# Patient Record
Sex: Female | Born: 1960 | Race: Black or African American | Hispanic: No | Smoking: Former smoker
Health system: Southern US, Community
[De-identification: ages and names within clinical notes are randomized; demographics above are authoritative.]

## PROBLEM LIST (undated history)

## (undated) DIAGNOSIS — I509 Heart failure, unspecified: Secondary | ICD-10-CM

## (undated) DIAGNOSIS — M1990 Unspecified osteoarthritis, unspecified site: Secondary | ICD-10-CM

## (undated) DIAGNOSIS — G4730 Sleep apnea, unspecified: Secondary | ICD-10-CM

## (undated) DIAGNOSIS — I10 Essential (primary) hypertension: Secondary | ICD-10-CM

## (undated) DIAGNOSIS — J45909 Unspecified asthma, uncomplicated: Secondary | ICD-10-CM

## (undated) DIAGNOSIS — E119 Type 2 diabetes mellitus without complications: Secondary | ICD-10-CM

## (undated) DIAGNOSIS — R569 Unspecified convulsions: Secondary | ICD-10-CM

## (undated) DIAGNOSIS — R0600 Dyspnea, unspecified: Secondary | ICD-10-CM

## (undated) DIAGNOSIS — I209 Angina pectoris, unspecified: Secondary | ICD-10-CM

## (undated) HISTORY — PX: CARPAL TUNNEL RELEASE: SHX101

## (undated) HISTORY — DX: Heart failure, unspecified: I50.9

## (undated) HISTORY — PX: TONSILLECTOMY: SUR1361

---

## 1998-01-14 ENCOUNTER — Other Ambulatory Visit: Admission: RE | Admit: 1998-01-14 | Discharge: 1998-01-14 | Payer: Self-pay | Admitting: Internal Medicine

## 1998-03-19 ENCOUNTER — Emergency Department (HOSPITAL_COMMUNITY): Admission: EM | Admit: 1998-03-19 | Discharge: 1998-03-19 | Payer: Self-pay | Admitting: Emergency Medicine

## 1998-03-21 ENCOUNTER — Other Ambulatory Visit: Admission: RE | Admit: 1998-03-21 | Discharge: 1998-03-21 | Payer: Self-pay | Admitting: Family Medicine

## 1998-04-03 ENCOUNTER — Emergency Department (HOSPITAL_COMMUNITY): Admission: EM | Admit: 1998-04-03 | Discharge: 1998-04-03 | Payer: Self-pay | Admitting: Emergency Medicine

## 1998-04-04 ENCOUNTER — Encounter: Admission: RE | Admit: 1998-04-04 | Discharge: 1998-04-04 | Payer: Self-pay | Admitting: Obstetrics

## 1998-08-13 ENCOUNTER — Emergency Department (HOSPITAL_COMMUNITY): Admission: EM | Admit: 1998-08-13 | Discharge: 1998-08-13 | Payer: Self-pay | Admitting: Emergency Medicine

## 1998-08-13 ENCOUNTER — Encounter: Payer: Self-pay | Admitting: Emergency Medicine

## 1998-09-16 ENCOUNTER — Ambulatory Visit (HOSPITAL_COMMUNITY): Admission: RE | Admit: 1998-09-16 | Discharge: 1998-09-16 | Payer: Self-pay | Admitting: Family Medicine

## 1998-09-16 ENCOUNTER — Encounter: Payer: Self-pay | Admitting: Family Medicine

## 1998-10-30 ENCOUNTER — Encounter: Payer: Self-pay | Admitting: Emergency Medicine

## 1998-10-30 ENCOUNTER — Emergency Department (HOSPITAL_COMMUNITY): Admission: EM | Admit: 1998-10-30 | Discharge: 1998-10-30 | Payer: Self-pay | Admitting: Emergency Medicine

## 1998-12-03 ENCOUNTER — Other Ambulatory Visit: Admission: RE | Admit: 1998-12-03 | Discharge: 1998-12-03 | Payer: Self-pay

## 1999-02-04 ENCOUNTER — Encounter: Payer: Self-pay | Admitting: Emergency Medicine

## 1999-02-04 ENCOUNTER — Emergency Department (HOSPITAL_COMMUNITY): Admission: EM | Admit: 1999-02-04 | Discharge: 1999-02-04 | Payer: Self-pay | Admitting: Emergency Medicine

## 1999-02-22 ENCOUNTER — Encounter: Payer: Self-pay | Admitting: Emergency Medicine

## 1999-02-22 ENCOUNTER — Emergency Department (HOSPITAL_COMMUNITY): Admission: EM | Admit: 1999-02-22 | Discharge: 1999-02-22 | Payer: Self-pay | Admitting: Emergency Medicine

## 1999-02-23 ENCOUNTER — Emergency Department (HOSPITAL_COMMUNITY): Admission: EM | Admit: 1999-02-23 | Discharge: 1999-02-23 | Payer: Self-pay | Admitting: Emergency Medicine

## 1999-03-04 ENCOUNTER — Encounter: Admission: RE | Admit: 1999-03-04 | Discharge: 1999-03-04 | Payer: Self-pay | Admitting: Obstetrics & Gynecology

## 1999-03-06 ENCOUNTER — Encounter: Payer: Self-pay | Admitting: Emergency Medicine

## 1999-03-06 ENCOUNTER — Emergency Department (HOSPITAL_COMMUNITY): Admission: EM | Admit: 1999-03-06 | Discharge: 1999-03-06 | Payer: Self-pay | Admitting: Emergency Medicine

## 1999-11-16 ENCOUNTER — Emergency Department (HOSPITAL_COMMUNITY): Admission: EM | Admit: 1999-11-16 | Discharge: 1999-11-16 | Payer: Self-pay | Admitting: Emergency Medicine

## 1999-11-16 ENCOUNTER — Encounter: Payer: Self-pay | Admitting: Emergency Medicine

## 2000-01-01 ENCOUNTER — Emergency Department (HOSPITAL_COMMUNITY): Admission: EM | Admit: 2000-01-01 | Discharge: 2000-01-01 | Payer: Self-pay | Admitting: Emergency Medicine

## 2002-08-29 ENCOUNTER — Emergency Department (HOSPITAL_COMMUNITY): Admission: EM | Admit: 2002-08-29 | Discharge: 2002-08-29 | Payer: Self-pay | Admitting: Emergency Medicine

## 2006-04-30 ENCOUNTER — Other Ambulatory Visit: Payer: Self-pay

## 2006-04-30 ENCOUNTER — Emergency Department: Payer: Self-pay | Admitting: Emergency Medicine

## 2006-06-12 ENCOUNTER — Emergency Department: Payer: Self-pay | Admitting: Emergency Medicine

## 2007-08-02 ENCOUNTER — Other Ambulatory Visit: Payer: Self-pay

## 2007-08-02 ENCOUNTER — Ambulatory Visit: Payer: Self-pay | Admitting: Orthopedic Surgery

## 2007-08-09 ENCOUNTER — Ambulatory Visit: Payer: Self-pay | Admitting: Orthopedic Surgery

## 2008-04-28 ENCOUNTER — Emergency Department: Payer: Self-pay | Admitting: Emergency Medicine

## 2008-12-15 ENCOUNTER — Emergency Department: Payer: Self-pay | Admitting: Emergency Medicine

## 2009-06-29 ENCOUNTER — Inpatient Hospital Stay: Payer: Self-pay | Admitting: Internal Medicine

## 2009-08-05 ENCOUNTER — Ambulatory Visit: Payer: Self-pay | Admitting: Unknown Physician Specialty

## 2009-08-08 ENCOUNTER — Ambulatory Visit: Payer: Self-pay | Admitting: Family Medicine

## 2009-08-22 ENCOUNTER — Ambulatory Visit: Payer: Self-pay | Admitting: Gastroenterology

## 2009-09-04 ENCOUNTER — Ambulatory Visit: Payer: Self-pay | Admitting: Family Medicine

## 2009-10-17 ENCOUNTER — Ambulatory Visit: Payer: Self-pay | Admitting: Family Medicine

## 2009-10-24 ENCOUNTER — Inpatient Hospital Stay: Payer: Self-pay | Admitting: Internal Medicine

## 2009-10-25 ENCOUNTER — Ambulatory Visit: Payer: Self-pay | Admitting: Cardiovascular Disease

## 2009-11-05 ENCOUNTER — Ambulatory Visit: Payer: Self-pay | Admitting: Family Medicine

## 2009-12-03 ENCOUNTER — Ambulatory Visit: Payer: Self-pay | Admitting: Family Medicine

## 2010-01-03 ENCOUNTER — Ambulatory Visit: Payer: Self-pay | Admitting: Family Medicine

## 2010-03-11 ENCOUNTER — Ambulatory Visit: Payer: Self-pay | Admitting: Gastroenterology

## 2010-09-09 ENCOUNTER — Ambulatory Visit: Payer: Self-pay | Admitting: Family Medicine

## 2010-10-09 ENCOUNTER — Ambulatory Visit: Payer: Self-pay | Admitting: Gastroenterology

## 2010-10-26 ENCOUNTER — Emergency Department (HOSPITAL_COMMUNITY)
Admission: EM | Admit: 2010-10-26 | Discharge: 2010-10-26 | Payer: Self-pay | Source: Home / Self Care | Admitting: Emergency Medicine

## 2010-10-28 LAB — URINALYSIS, ROUTINE W REFLEX MICROSCOPIC
Leukocytes, UA: NEGATIVE
Nitrite: NEGATIVE
Specific Gravity, Urine: 1.02 (ref 1.005–1.030)
Urobilinogen, UA: 0.2 mg/dL (ref 0.0–1.0)

## 2010-10-28 LAB — URINE MICROSCOPIC-ADD ON

## 2010-10-28 LAB — BASIC METABOLIC PANEL
BUN: 10 mg/dL (ref 6–23)
CO2: 28 mEq/L (ref 19–32)
Calcium: 9.2 mg/dL (ref 8.4–10.5)
Creatinine, Ser: 0.94 mg/dL (ref 0.4–1.2)
Glucose, Bld: 96 mg/dL (ref 70–99)

## 2010-10-28 LAB — CBC
HCT: 40.3 % (ref 36.0–46.0)
Hemoglobin: 13 g/dL (ref 12.0–15.0)
MCV: 103.9 fL — ABNORMAL HIGH (ref 78.0–100.0)
WBC: 7.4 10*3/uL (ref 4.0–10.5)

## 2010-10-28 LAB — GLUCOSE, CAPILLARY

## 2010-10-28 LAB — POCT CARDIAC MARKERS
CKMB, poc: 1.5 ng/mL (ref 1.0–8.0)
CKMB, poc: 1.1 ng/mL (ref 1.0–8.0)
Myoglobin, poc: 50.1 ng/mL (ref 12–200)
Troponin i, poc: <0.05 ng/mL (ref 0.00–0.09)

## 2010-10-28 LAB — DIFFERENTIAL
Basophils Absolute: 0 10*3/uL (ref 0.0–0.1)
Lymphocytes Relative: 50 % — ABNORMAL HIGH (ref 12–46)
Lymphs Abs: 3.7 10*3/uL (ref 0.7–4.0)
Monocytes Absolute: 0.5 10*3/uL (ref 0.1–1.0)
Neutro Abs: 3.1 10*3/uL (ref 1.7–7.7)

## 2010-11-10 ENCOUNTER — Ambulatory Visit: Payer: Self-pay | Admitting: Neurology

## 2010-11-21 ENCOUNTER — Emergency Department: Payer: Self-pay | Admitting: Emergency Medicine

## 2011-09-04 ENCOUNTER — Ambulatory Visit: Payer: Self-pay | Admitting: Ophthalmology

## 2011-09-04 DIAGNOSIS — I119 Hypertensive heart disease without heart failure: Secondary | ICD-10-CM

## 2011-09-07 ENCOUNTER — Ambulatory Visit: Payer: Self-pay | Admitting: Ophthalmology

## 2012-05-31 ENCOUNTER — Ambulatory Visit: Payer: Self-pay | Admitting: Family Medicine

## 2012-06-16 ENCOUNTER — Emergency Department: Payer: Self-pay | Admitting: Emergency Medicine

## 2012-06-23 ENCOUNTER — Ambulatory Visit: Payer: Self-pay | Admitting: Family Medicine

## 2012-12-01 ENCOUNTER — Ambulatory Visit: Payer: Self-pay | Admitting: Pain Medicine

## 2012-12-14 ENCOUNTER — Ambulatory Visit: Payer: Self-pay | Admitting: Pain Medicine

## 2013-01-12 ENCOUNTER — Ambulatory Visit: Payer: Self-pay | Admitting: Pain Medicine

## 2013-01-30 ENCOUNTER — Ambulatory Visit: Payer: Self-pay | Admitting: Pain Medicine

## 2013-04-18 ENCOUNTER — Ambulatory Visit: Payer: Self-pay | Admitting: Neurosurgery

## 2014-02-07 ENCOUNTER — Ambulatory Visit: Payer: Self-pay | Admitting: Specialist

## 2014-02-07 LAB — BASIC METABOLIC PANEL
Anion Gap: 4 — ABNORMAL LOW (ref 7–16)
BUN: 23 mg/dL — ABNORMAL HIGH (ref 7–18)
CHLORIDE: 109 mmol/L — AB (ref 98–107)
Calcium, Total: 8.4 mg/dL — ABNORMAL LOW (ref 8.5–10.1)
Co2: 27 mmol/L (ref 21–32)
Creatinine: 1.22 mg/dL (ref 0.60–1.30)
EGFR (Non-African Amer.): 50 — ABNORMAL LOW
GFR CALC AF AMER: 58 — AB
Glucose: 96 mg/dL (ref 65–99)
OSMOLALITY: 283 (ref 275–301)
Potassium: 4.2 mmol/L (ref 3.5–5.1)
Sodium: 140 mmol/L (ref 136–145)

## 2014-02-07 LAB — CBC WITH DIFFERENTIAL/PLATELET
BASOS ABS: 0 10*3/uL (ref 0.0–0.1)
Basophil %: 0.5 %
Eosinophil #: 0.2 10*3/uL (ref 0.0–0.7)
Eosinophil %: 2.1 %
HCT: 38.1 % (ref 35.0–47.0)
HGB: 12.6 g/dL (ref 12.0–16.0)
LYMPHS ABS: 3.1 10*3/uL (ref 1.0–3.6)
LYMPHS PCT: 38.5 %
MCH: 34.8 pg — AB (ref 26.0–34.0)
MCHC: 33.2 g/dL (ref 32.0–36.0)
MCV: 105 fL — ABNORMAL HIGH (ref 80–100)
MONO ABS: 0.5 x10 3/mm (ref 0.2–0.9)
Monocyte %: 6.7 %
NEUTROS ABS: 4.2 10*3/uL (ref 1.4–6.5)
NEUTROS PCT: 52.2 %
Platelet: 198 10*3/uL (ref 150–440)
RBC: 3.64 10*6/uL — AB (ref 3.80–5.20)
RDW: 13.2 % (ref 11.5–14.5)
WBC: 8.1 10*3/uL (ref 3.6–11.0)

## 2014-02-13 ENCOUNTER — Ambulatory Visit: Payer: Self-pay | Admitting: Specialist

## 2014-02-14 LAB — PATHOLOGY REPORT

## 2014-03-01 ENCOUNTER — Ambulatory Visit: Payer: Self-pay | Admitting: Ophthalmology

## 2014-03-14 ENCOUNTER — Ambulatory Visit: Payer: Self-pay | Admitting: Ophthalmology

## 2014-05-08 ENCOUNTER — Ambulatory Visit: Payer: Self-pay | Admitting: Ophthalmology

## 2014-05-22 ENCOUNTER — Ambulatory Visit: Payer: Self-pay | Admitting: Ophthalmology

## 2015-01-26 NOTE — Op Note (Signed)
PATIENT NAME:  Jaime Fitzgerald, Jaime Fitzgerald MR#:  [id number] DATE OF BIRTH:  30-Dec-1960  DATE OF PROCEDURE:  02/13/2014  PREOPERATIVE DIAGNOSES:  1. Extensor tendon tenosynovitis, right hand.  2. Volar right wrist ganglion.   PROCEDURE:  1. Dorsal extensor tenosynovectomy, right hand.  2. Excision of volar ganglion, right wrist.   SURGEON: Lucas Mallow, MD  ANESTHESIA: General.   COMPLICATIONS: None.   TOURNIQUET TIME: 43 minutes.   DESCRIPTION OF PROCEDURE: Clindamycin 600 mg was given intravenously prior to the procedure. General anesthesia is induced. The right upper extremity is thoroughly wrapped out with the Esmarch bandage and pneumatic tourniquet elevated to 250 mmHg. Under loupe magnification, a longitudinal curved incision is made over the dorsum of the hand in the area of swelling over the extensor tendons. The dissection is carefully carried down to the extensor retinaculum, which is incised longitudinally and preserved. There is seen to be extensive tenosynovial infiltration of the extensor tendons at this level. Under loupe magnification, using the small scissors and the knife, complete tenosynovectomy was performed. The rongeur was then used to remove any small excess residual synovium. The wound is thoroughly irrigated multiple times. Skin edges are infiltrated with 0.5% plain Marcaine. The extensor retinaculum was closed with several small 5-0 Vicryl sutures. Two Vicryl sutures were used subcutaneously. The skin is closed with a running subcuticular 3-0 Prolene, and Steri-Strips were applied. The hand was then turned over, and a volar longitudinal incision is made over the prominence of the volar radial ganglion. Under loupe magnification, the dissection is carried down to the ganglion itself, which is seen to be coming from the distal flexor tendon sheath of the flexor carpi radialis. Under direct vision, the ganglion along with flexor tendon sheath is completely excised. Hemostasis  was maintained at all times with the Bovie. The wound is thoroughly irrigated multiple times. Several 5-0 Vicryl sutures were used subcutaneously. The skin is closed with a running subcuticular 3-0 Prolene with Steri-Strips. Soft bulky dressing is applied with a volar splint. The tourniquet is released. The patient is returned to the recovery room in satisfactory condition, having tolerated the procedure quite well.   ____________________________ Lucas Mallow, MD ces:lb D: 02/13/2014 11:49:26 ET T: 02/13/2014 12:59:41 ET JOB#: [id number]  cc: Lucas Mallow, MD, <Dictator> Lucas Mallow MD ELECTRONICALLY SIGNED 02/21/2014 13:58

## 2015-01-26 NOTE — Op Note (Signed)
PATIENT NAME:  Jaime Fitzgerald, Jaime Fitzgerald MR#:  [id number] DATE OF BIRTH:  02-Jun-1961  DATE OF PROCEDURE:  03/14/2014  PROCEDURES PERFORMED: 1.  Pars plana vitrectomy of the left eye.  2.  Epiretinal membrane peel of the left eye.  3.  Focal laser of the left eye.   PREOPERATIVE DIAGNOSES: 1.  Vitreomacular traction.   POSTOPERATIVE DIAGNOSES:  1.  Vitreomacular traction.  2.  Retinal tear.   PRIMARY SURGEON:  Teresa Pelton. Starling Manns, M.D.   ANESTHESIA:  General endotracheal anesthesia with supplemental retrobulbar block.   INDICATIONS FOR PROCEDURE: This is a patient who presented to my office with decreased vision in the left eye. Examination revealed significant vitreomacular traction and associated cystoid macular edema. Risks, benefits and alternatives of the above procedure were discussed and the patient wished to proceed.   DETAILS OF PROCEDURE:  After informed consent was obtained, the patient was brought into the operative suite at [facility]. The patient was placed in supine position and was induced by the anesthesia team without any complications. A retrobulbar block was performed on the left eye by the primary surgeon without any complication. The left eye was prepped and draped in a sterile manner. After a lid speculum was inserted, a 25-gauge trocar was placed inferotemporally through displaced conjunctiva in an oblique fashion 3 mm beyond the limbus. The infusion cannula was turned on and inserted through the trocar and secured into position with Steri-Strips. Two more trocars were placed in a similar fashion superotemporally and superonasally. The vitreous cutter and light pipe were introduced into the eye and a core vitrectomy was performed. Several attempts at engaging the vitreous face were performed using vacuum. This was unsuccessful. Triesence was placed into the eye and removed in order to stain the vitreous. Using alternating sharp pick and suction, the vitreous  was finally incised and elevated. Using traction and careful dissection, the vitreomacular traction was relieved. The rest of the vitreous base was easily elevated after the posterior had been released. The vitreous face was trimmed away for 360 degrees out to the ora serrata. Two small retinal tears were identified in the posterior. Three rows of laser were placed around each of these tears. There were no signs of any subretinal fluid. A scleral depressed exam was performed for 360 degrees and no further breaks, tears or retinal detachment could be identified. A complete air-fluid exchange was performed and 20% SF6 was used as an Engineer, building services. The trocars were removed and the wounds were noted to be airtight. Pressure in the eye was confirmed to be approximately 15 mmHg. The lid speculum was removed and the eye was cleaned. TobraDex was placed on the eye and a patch and shield were placed over the eye. The patient was reversed from anesthesia and was taken to postanesthesia care with instruction to remain head up.      ____________________________ Teresa Pelton. Machaela Caterino, MD mfa:dmm D: 03/14/2014 10:02:53 ET T: 03/14/2014 10:25:58 ET JOB#: [id number]  cc: Teresa Pelton. Starling Manns, MD, <Dictator> Coralee Rud MD ELECTRONICALLY SIGNED 03/28/2014 11:41

## 2015-01-26 NOTE — Op Note (Signed)
PATIENT NAME:  Jaime Fitzgerald, Jaime Fitzgerald MR#:  [id number] DATE OF BIRTH:  1960/12/31  DATE OF PROCEDURE:  05/22/2014  PREOPERATIVE DIAGNOSIS: Visually significant cataract of the right eye.   POSTOPERATIVE DIAGNOSIS: Visually significant cataract of the right eye.   OPERATIVE PROCEDURE: Cataract extraction by phacoemulsification with implant of intraocular lens to the right eye.   SURGEON: Birder Robson, MD  ANESTHESIA:  1. Managed anesthesia care.  2. 50-50 mixture of 0.75% bupivacaine and 4% Xylocaine given as a retrobulbar block.   COMPLICATIONS: None.   TECHNIQUE:  Stop and chop.   DESCRIPTION OF PROCEDURE: The patient was examined and consented for this procedure in the preoperative holding area and then brought back to the Operating Room where the anesthesia team employed managed anesthesia care.  3.5 milliliters of the aforementioned mixture were placed in the right orbit on an Atkinson needle without complication. The right eye was then prepped and draped in the usual sterile ophthalmic fashion. A lid speculum was placed. The side-port blade was used to create a paracentesis and the anterior chamber was filled with viscoelastic. The keratome was used to create a near clear corneal incision. The continuous curvilinear capsulorrhexis was performed with a cystotome followed by the capsulorrhexis forceps. Hydrodissection and hydrodelineation were carried out with BSS on a blunt cannula. The lens was removed in a stop and chop technique. The remaining cortical material was removed with the irrigation-aspiration handpiece. The capsular bag was inflated with viscoelastic and the Tecnis ZCB00, 21.0-diopter lens, serial number [id number] was placed in the capsular bag without complication. The remaining viscoelastic was removed from the eye with the irrigation-aspiration handpiece. The wounds were hydrated. The anterior chamber was flushed with Miostat and the eye was inflated to a physiologic pressure.  Please note that cefuroxime was not placed within the eye due to PENICILLIN allergy, rather a 4:1 dilution of Vigamox was used and placed in the anterior chamber. The wounds were found to be water tight. The eye was dressed with Vigamox followed by Maxitrol ointment and a protective shield was placed. The patient will followup with me in one day.     ____________________________ Livingston Diones. Candice Lunney, MD wlp:LT D: 05/22/2014 20:32:26 ET T: 05/22/2014 21:47:03 ET JOB#: [id number]  cc: Coline Calkin L. Maridel Pixler, MD, <Dictator> Livingston Diones Y[address] MD ELECTRONICALLY SIGNED 05/23/2014 8:51

## 2015-12-24 ENCOUNTER — Emergency Department: Payer: Medicaid Other

## 2015-12-24 ENCOUNTER — Emergency Department
Admission: EM | Admit: 2015-12-24 | Discharge: 2015-12-24 | Disposition: A | Discharge status: Home / Self Care | Payer: Medicaid Other | Attending: Emergency Medicine | Admitting: Emergency Medicine

## 2015-12-24 DIAGNOSIS — E119 Type 2 diabetes mellitus without complications: Secondary | ICD-10-CM | POA: Insufficient documentation

## 2015-12-24 DIAGNOSIS — I10 Essential (primary) hypertension: Secondary | ICD-10-CM | POA: Diagnosis not present

## 2015-12-24 DIAGNOSIS — R05 Cough: Secondary | ICD-10-CM

## 2015-12-24 DIAGNOSIS — Z880 Allergy status to penicillin: Secondary | ICD-10-CM | POA: Diagnosis not present

## 2015-12-24 DIAGNOSIS — R1010 Upper abdominal pain, unspecified: Secondary | ICD-10-CM | POA: Insufficient documentation

## 2015-12-24 DIAGNOSIS — H748X3 Other specified disorders of middle ear and mastoid, bilateral: Secondary | ICD-10-CM | POA: Diagnosis not present

## 2015-12-24 DIAGNOSIS — R059 Cough, unspecified: Secondary | ICD-10-CM

## 2015-12-24 DIAGNOSIS — J101 Influenza due to other identified influenza virus with other respiratory manifestations: Secondary | ICD-10-CM | POA: Insufficient documentation

## 2015-12-24 HISTORY — DX: Unspecified convulsions: R56.9

## 2015-12-24 HISTORY — DX: Essential (primary) hypertension: I10

## 2015-12-24 HISTORY — DX: Type 2 diabetes mellitus without complications: E11.9

## 2015-12-24 LAB — RAPID INFLUENZA A&B ANTIGENS (ARMC ONLY)
INFLUENZA A (ARMC): NEGATIVE
INFLUENZA B (ARMC): POSITIVE — AB

## 2015-12-24 MED ORDER — HYDROCOD POLST-CPM POLST ER 10-8 MG/5ML PO SUER: 5.0000 mL | Freq: Two times a day (BID) | ORAL | Status: DC | PRN

## 2015-12-24 MED ORDER — OSELTAMIVIR PHOSPHATE 75 MG PO CAPS: 75.0000 mg | ORAL_CAPSULE | Freq: Two times a day (BID) | ORAL | Status: DC

## 2015-12-24 MED ORDER — AZITHROMYCIN 250 MG PO TABS: ORAL_TABLET | ORAL | Status: DC

## 2015-12-24 NOTE — Discharge Instructions (Signed)

## 2015-12-24 NOTE — ED Provider Notes (Signed)
[facility] Emergency Department Provider Note  ____________________________________________  Time seen: Approximately 5:10 PM  I have reviewed the triage vital signs and the nursing notes.   HISTORY  Chief Complaint Influenza    HPI Jaime Fitzgerald is a 55 y.o. female , NAD, presents to the emergency department with 3 day history of cough, congestion. States body aches began in the last 1-2 days. Has had some fever off and on at home. Has noted some wheezing but she utilizes an albuterol rescue inhaler which seems to help. States she has upper abdominal pain when she coughs only. Notes some dizziness with spinning sensation over the last couple of days. Has not had any numbness, weakness, tingling. Has been talking and walking without any changes. No chest pain, shortness of breath. Has not had any nausea, vomiting, diarrhea. Denies sore throat, headache. Is diabetic and states she has been taking her medications as prescribed without any disruption.   Past Medical History  Diagnosis Date  . Diabetes mellitus without complication ([place])   . Hypertension   . Seizures ([place])     There are no active problems to display for this patient.   Past Surgical History  Procedure Laterality Date  . Tonsillectomy    . Carpal tunnel release      Current Outpatient Rx  Name  Route  Sig  Dispense  Refill  . azithromycin (ZITHROMAX Z-PAK) 250 MG tablet      Take 2 tablets (500 mg) on  Day 1,  followed by 1 tablet (250 mg) once daily on Days 2 through 5.   6 each   0   . chlorpheniramine-HYDROcodone (TUSSIONEX PENNKINETIC ER) 10-8 MG/5ML SUER   Oral   Take 5 mLs by mouth every 12 (twelve) hours as needed for cough.   50 mL   0   . oseltamivir (TAMIFLU) 75 MG capsule   Oral   Take 1 capsule (75 mg total) by mouth 2 (two) times daily.   10 capsule   0     Allergies Penicillins  No family history on file.  Social History Social History  Substance Use  Topics  . Smoking status: Never Smoker   . Smokeless tobacco: None  . Alcohol Use: No     Review of Systems  Constitutional: Positive fever, chills. Eyes: No visual changes. No discharge, redness, swelling ENT: Positive nasal congestion, runny nose. No sore throat, sinus pressure, ear pain. Cardiovascular: No chest pain or palpitations. Respiratory: Positive cough, chest congestion, wheezing. No shortness of breath. Gastrointestinal: Positive upper abdominal pain with cough only.  No nausea, vomiting.  No diarrhea.   Musculoskeletal: Positive for general myalgias. Skin: Negative for rash. Neurological: Negative for headaches, focal weakness or numbness. 10-point ROS otherwise negative.  ____________________________________________   PHYSICAL EXAM:  VITAL SIGNS: ED Triage Vitals  Enc Vitals Group     BP 12/24/15 1648 173/84 mmHg     Pulse Rate 12/24/15 1648 96     Resp 12/24/15 1648 20     Temp 12/24/15 1648 100.1 F (37.8 C)     Temp Source 12/24/15 1648 Oral     SpO2 12/24/15 1648 95 %     Weight 12/24/15 1648 183 lb (83.008 kg)     Height 12/24/15 1648 5\' 3"  (1.6 m)     Head Cir --      Peak Flow --      Pain Score 12/24/15 1650 10     Pain Loc --  Pain Edu? --      Excl. in [place]? --     Constitutional: Alert and oriented. Ill appearing but in no acute distress. Eyes: Conjunctivae are normal. PERRL. EOMI without pain.  Head: Atraumatic. ENT:      Ears: TMs visualized bilaterally with moderate serous effusion but no bulging, erythema, perforation.      Nose: Moderate congestion with clear rhinnorhea. Turbinates with mild injection.      Mouth/Throat: Mucous membranes are moist. Pharynx without erythema, swelling, 88. Profuse clear postnasal drip. Neck: No stridor. Supple with full range of motion. Hematological/Lymphatic/Immunilogical: No cervical lymphadenopathy. Cardiovascular: Normal rate, regular rhythm. Normal S1 and S2.  No murmurs or gallops. Good  peripheral circulation. Respiratory: Normal respiratory effort without tachypnea or retractions. Lungs CTAB with breath sounds noted throughout. Gastrointestinal: Soft and nontender in all quadrants. No distention, guarding.  Neurologic:  Normal speech and language. No gross focal neurologic deficits are appreciated.  Skin:  Skin is warm, dry and intact. No rash noted. Psychiatric: Mood and affect are normal. Speech and behavior are normal. Patient exhibits appropriate insight and judgement.   ____________________________________________   LABS (all labs ordered are listed, but only abnormal results are displayed)  Labs Reviewed  RAPID INFLUENZA A&B ANTIGENS ([place]) - Abnormal; Notable for the following:    Influenza B ([facility]) POSITIVE (*)    All other components within normal limits   ____________________________________________  EKG  None ____________________________________________  RADIOLOGY I have personally viewed and evaluated these images (plain radiographs) as part of my medical decision making, as well as reviewing the written report by the radiologist.  Dg Chest 2 View  12/24/2015  CLINICAL DATA:  Cough congestion body aches with fever for 1 day EXAM: CHEST  2 VIEW COMPARISON:  10/24/2009 FINDINGS: Mild cardiac enlargement. Vascular pattern normal. Mild right lower lobe atelectasis. Left lung is clear. No pleural effusion. IMPRESSION: No acute findings Electronically Signed   By: Skipper Cliche M.D.   On: 12/24/2015 18:05    ____________________________________________    PROCEDURES  Procedure(s) performed: None    Medications - No data to display   ____________________________________________   INITIAL IMPRESSION / ASSESSMENT AND PLAN / ED COURSE  Pertinent labs & imaging results that were available during my care of the patient were reviewed by me and considered in my medical decision making (see chart for details).  Patient's diagnosis is  consistent with influenza. Patient will be discharged home with prescriptions for Tamiflu, Zithromax, dictation next to take as directed. Zithromax given to cover for any potential secondary bacterial infection associated with influenza while the patient is a diabetic. Patient may take Tylenol as needed for fever and aches. Patient is to follow up with her primary care provider or [facility] in 2 days for recheck. Patient is given ED precautions to return to the ED for any worsening or new symptoms.     ____________________________________________  FINAL CLINICAL IMPRESSION(S) / ED DIAGNOSES  Final diagnoses:  Influenza B  Cough      NEW MEDICATIONS STARTED DURING THIS VISIT:  New Prescriptions   AZITHROMYCIN (ZITHROMAX Z-PAK) 250 MG TABLET    Take 2 tablets (500 mg) on  Day 1,  followed by 1 tablet (250 mg) once daily on Days 2 through 5.   CHLORPHENIRAMINE-HYDROCODONE (TUSSIONEX PENNKINETIC ER) 10-8 MG/5ML SUER    Take 5 mLs by mouth every 12 (twelve) hours as needed for cough.   OSELTAMIVIR (TAMIFLU) 75 MG CAPSULE    Take 1  capsule (75 mg total) by mouth 2 (two) times daily.         Braxton Feathers, PA-C 12/24/15 1816  Harvest Dark, MD 12/25/15 [phone]

## 2015-12-24 NOTE — ED Notes (Signed)
Pt c/o cough, cogestion, bodyaches with fever since yesterday.

## 2016-02-17 ENCOUNTER — Other Ambulatory Visit: Payer: Self-pay | Admitting: Neurology

## 2016-02-17 DIAGNOSIS — R569 Unspecified convulsions: Secondary | ICD-10-CM

## 2016-02-17 DIAGNOSIS — G43719 Chronic migraine without aura, intractable, without status migrainosus: Secondary | ICD-10-CM

## 2016-03-11 ENCOUNTER — Ambulatory Visit
Admission: RE | Admit: 2016-03-11 | Discharge: 2016-03-11 | Disposition: A | Discharge status: Home / Self Care | Payer: Medicaid Other | Source: Ambulatory Visit | Attending: Neurology | Admitting: Neurology

## 2016-03-11 DIAGNOSIS — G43719 Chronic migraine without aura, intractable, without status migrainosus: Secondary | ICD-10-CM | POA: Diagnosis not present

## 2016-03-11 DIAGNOSIS — R569 Unspecified convulsions: Secondary | ICD-10-CM | POA: Insufficient documentation

## 2016-03-11 MED ORDER — GADOBENATE DIMEGLUMINE 529 MG/ML IV SOLN
20.0000 mL | Freq: Once | INTRAVENOUS | Status: AC | PRN
  Administered 2016-03-11: 17 mL via INTRAVENOUS

## 2016-11-03 ENCOUNTER — Emergency Department
Admission: EM | Admit: 2016-11-03 | Discharge: 2016-11-03 | Disposition: A | Discharge status: Home / Self Care | Payer: Medicaid Other | Attending: Emergency Medicine | Admitting: Emergency Medicine

## 2016-11-03 ENCOUNTER — Emergency Department: Payer: Medicaid Other

## 2016-11-03 DIAGNOSIS — K5790 Diverticulosis of intestine, part unspecified, without perforation or abscess without bleeding: Secondary | ICD-10-CM

## 2016-11-03 DIAGNOSIS — E119 Type 2 diabetes mellitus without complications: Secondary | ICD-10-CM | POA: Diagnosis not present

## 2016-11-03 DIAGNOSIS — Z79899 Other long term (current) drug therapy: Secondary | ICD-10-CM | POA: Insufficient documentation

## 2016-11-03 DIAGNOSIS — R112 Nausea with vomiting, unspecified: Secondary | ICD-10-CM | POA: Diagnosis present

## 2016-11-03 DIAGNOSIS — K5730 Diverticulosis of large intestine without perforation or abscess without bleeding: Secondary | ICD-10-CM | POA: Insufficient documentation

## 2016-11-03 DIAGNOSIS — I10 Essential (primary) hypertension: Secondary | ICD-10-CM | POA: Diagnosis not present

## 2016-11-03 DIAGNOSIS — R1084 Generalized abdominal pain: Secondary | ICD-10-CM

## 2016-11-03 LAB — CBC
HCT: 37.1 % (ref 35.0–47.0)
Hemoglobin: 12.5 g/dL (ref 12.0–16.0)
MCH: 34.6 pg — AB (ref 26.0–34.0)
MCHC: 33.7 g/dL (ref 32.0–36.0)
MCV: 102.8 fL — AB (ref 80.0–100.0)
Platelets: 205 10*3/uL (ref 150–440)
RBC: 3.61 MIL/uL — AB (ref 3.80–5.20)
RDW: 13.5 % (ref 11.5–14.5)
WBC: 5.7 10*3/uL (ref 3.6–11.0)

## 2016-11-03 LAB — COMPREHENSIVE METABOLIC PANEL
ALT: 21 U/L (ref 14–54)
AST: 23 U/L (ref 15–41)
Albumin: 3.7 g/dL (ref 3.5–5.0)
Alkaline Phosphatase: 73 U/L (ref 38–126)
Anion gap: 5 (ref 5–15)
BUN: 10 mg/dL (ref 6–20)
CHLORIDE: 108 mmol/L (ref 101–111)
CO2: 31 mmol/L (ref 22–32)
CREATININE: 0.91 mg/dL (ref 0.44–1.00)
Calcium: 8.3 mg/dL — ABNORMAL LOW (ref 8.9–10.3)
GFR calc Af Amer: >60 mL/min (ref >60)
GFR calc non Af Amer: >60 mL/min (ref >60)
Glucose, Bld: 127 mg/dL — ABNORMAL HIGH (ref 65–99)
Potassium: 3.7 mmol/L (ref 3.5–5.1)
SODIUM: 144 mmol/L (ref 135–145)
Total Bilirubin: 0.4 mg/dL (ref 0.3–1.2)
Total Protein: 6.7 g/dL (ref 6.5–8.1)

## 2016-11-03 LAB — LIPASE, BLOOD: LIPASE: 30 U/L (ref 11–51)

## 2016-11-03 LAB — GLUCOSE, CAPILLARY: Glucose-Capillary: 99 mg/dL (ref 65–99)

## 2016-11-03 MED ORDER — MECLIZINE HCL 25 MG PO TABS: 25.0000 mg | ORAL_TABLET | Freq: Three times a day (TID) | ORAL | 0 refills | Status: DC | PRN

## 2016-11-03 MED ORDER — IOPAMIDOL (ISOVUE-300) INJECTION 61%
30.0000 mL | Freq: Once | INTRAVENOUS | Status: AC
  Administered 2016-11-03: 30 mL via ORAL

## 2016-11-03 MED ORDER — IOPAMIDOL (ISOVUE-300) INJECTION 61%
100.0000 mL | Freq: Once | INTRAVENOUS | Status: AC | PRN
Start: 2016-11-03 — End: 2016-11-03
  Administered 2016-11-03: 100 mL via INTRAVENOUS

## 2016-11-03 MED ORDER — MECLIZINE HCL 25 MG PO TABS
25.0000 mg | ORAL_TABLET | ORAL | Status: AC
  Administered 2016-11-03: 25 mg via ORAL
  Filled 2016-11-03: qty 1

## 2016-11-03 NOTE — ED Provider Notes (Signed)
[facility] Emergency Department Provider Note  ____________________________________________   First MD Initiated Contact with Patient 11/03/16 1524     (approximate)  I have reviewed the triage vital signs and the nursing notes.   HISTORY  Chief Complaint Melena    HPI Jaime Fitzgerald is a 56 y.o. female with a history that includes diabetes, hypertension, and reported seizure disorder who presents for evaluation of dark stools, abdominal pain, vomiting, and general malaise.  She was seen by her primary care doctor at [facility] today and sent over here by cab for further evaluation.  That she has had dark stools for months but that over the last 3 weeks she has had increasing abdominal pain mostly on the left side, nausea, and several episodes of emesis over the last few days.  She reports that the pain is severe at times and then at other times goes away completely.  She denies fever/chills, chest pain, shortness of breath.  She has no dysuria.  She reports that nothing in particular makes her symptoms better nor worse.   Past Medical History:  Diagnosis Date  . Diabetes mellitus without complication ([place])   . Hypertension   . Seizures ([place])     There are no active problems to display for this patient.   Past Surgical History:  Procedure Laterality Date  . CARPAL TUNNEL RELEASE    . TONSILLECTOMY      Prior to Admission medications   Medication Sig Start Date End Date Taking? Authorizing Provider  azithromycin (ZITHROMAX Z-PAK) 250 MG tablet Take 2 tablets (500 mg) on  Day 1,  followed by 1 tablet (250 mg) once daily on Days 2 through 5. 12/24/15   Jami L Hagler, PA-C  chlorpheniramine-HYDROcodone (TUSSIONEX PENNKINETIC ER) 10-8 MG/5ML SUER Take 5 mLs by mouth every 12 (twelve) hours as needed for cough. 12/24/15   Jami L Hagler, PA-C  meclizine (ANTIVERT) 25 MG tablet Take 1 tablet (25 mg total) by mouth 3 (three) times daily as needed for  dizziness. 11/03/16   Hinda Kehr, MD  oseltamivir (TAMIFLU) 75 MG capsule Take 1 capsule (75 mg total) by mouth 2 (two) times daily. 12/24/15   Jami L Hagler, PA-C    Allergies Penicillins  No family history on file.  Social History Social History  Substance Use Topics  . Smoking status: Never Smoker  . Smokeless tobacco: Not on file  . Alcohol use No    Review of Systems Constitutional: No fever/chills Eyes: No visual changes. ENT: No sore throat. Cardiovascular: Denies chest pain. Respiratory: Denies shortness of breath. Gastrointestinal: +abd pain w/ N/V.  Dark sools Genitourinary: Negative for dysuria. Musculoskeletal: Negative for back pain. Skin: Negative for rash. Neurological: Negative for headaches, focal weakness or numbness.  10-point ROS otherwise negative.  ____________________________________________   PHYSICAL EXAM:  VITAL SIGNS: ED Triage Vitals  Enc Vitals Group     BP 11/03/16 1312 (!) 175/66     Pulse Rate 11/03/16 1312 63     Resp 11/03/16 1312 16     Temp 11/03/16 1312 98.2 F (36.8 C)     Temp Source 11/03/16 1312 Oral     SpO2 11/03/16 1312 97 %     Weight 11/03/16 1313 185 lb (83.9 kg)     Height --      Head Circumference --      Peak Flow --      Pain Score 11/03/16 1313 10     Pain  Loc --      Pain Edu? --      Excl. in [place]? --     Constitutional: Alert and oriented. Well appearing and in no acute distress. Eyes: Conjunctivae are normal. PERRL. EOMI. Head: Atraumatic. Nose: No congestion/rhinnorhea. Mouth/Throat: Mucous membranes are moist.  Oropharynx non-erythematous. Neck: No stridor.  No meningeal signs.   Cardiovascular: Normal rate, regular rhythm. Good peripheral circulation. Grossly normal heart sounds. Respiratory: Normal respiratory effort.  No retractions. Lungs CTAB. Gastrointestinal: Soft with generalized tenderness throughout, worse in LLQ. Rectal:  Normal external exam.  Moderately dark stool that is  heme-negative, quality control passed.  Nurse chaperone present throughout exam. Musculoskeletal: No lower extremity tenderness nor edema. No gross deformities of extremities. Neurologic:  Normal speech and language. No gross focal neurologic deficits are appreciated.  Skin:  Skin is warm, dry and intact. No rash noted. Psychiatric: Mood and affect are normal. Speech and behavior are normal.  ____________________________________________   LABS (all labs ordered are listed, but only abnormal results are displayed)  Labs Reviewed  COMPREHENSIVE METABOLIC PANEL - Abnormal; Notable for the following:       Result Value   Glucose, Bld 127 (*)    Calcium 8.3 (*)    All other components within normal limits  CBC - Abnormal; Notable for the following:    RBC 3.61 (*)    MCV 102.8 (*)    MCH 34.6 (*)    All other components within normal limits  LIPASE, BLOOD  GLUCOSE, CAPILLARY  URINALYSIS, COMPLETE (UACMP) WITH MICROSCOPIC   ____________________________________________  EKG  None - EKG not ordered by ED physician ____________________________________________  RADIOLOGY   Ct Abdomen Pelvis W Contrast  Result Date: 11/03/2016 CLINICAL DATA:  Lower abdominal pain, nausea and vomiting. Melena. Progressively worsening for 3 weeks. EXAM: CT ABDOMEN AND PELVIS WITH CONTRAST TECHNIQUE: Multidetector CT imaging of the abdomen and pelvis was performed using the standard protocol following bolus administration of intravenous contrast. CONTRAST:  158mL ISOVUE-300 IOPAMIDOL (ISOVUE-300) INJECTION 61% COMPARISON:  06/29/2009, 08/22/2009. FINDINGS: Lower chest: No acute abnormality. Hepatobiliary: Several vascular lesions are scattered throughout the liver without significant change from 2010, likely benign hemangiomas. No suspicious focal liver lesion. Gallbladder and bile ducts are unremarkable. Pancreas: Unremarkable. No pancreatic ductal dilatation or surrounding inflammatory changes. Spleen:  Normal in size without focal abnormality. Adrenals/Urinary Tract: Adrenal glands are unremarkable. Kidneys are normal, without renal calculi, focal lesion, or hydronephrosis. Bladder is unremarkable. Stomach/Bowel: Stomach and small bowel are normal. Appendix is normal. Extensive left hemicolon diverticulosis. No evidence of diverticulitis or other acute inflammatory process. No bowel obstruction. No extraluminal air. Vascular/Lymphatic: Aortic atherosclerosis. No enlarged abdominal or pelvic lymph nodes. Reproductive: Status post hysterectomy. No adnexal masses. Other: No ascites. No focal inflammatory process. Small fat containing umbilical hernia. Musculoskeletal: No significant skeletal lesion. IMPRESSION: 1. No acute findings. 2. Diverticulosis. 3. Stable benign enhancing lesions of the liver, likely cavernous hemangiomas. 4. Aortic atherosclerosis. Electronically Signed   By: Andreas Newport M.D.   On: 11/03/2016 20:00    ____________________________________________   PROCEDURES  Procedure(s) performed:   Procedures   Critical Care performed: No ____________________________________________   INITIAL IMPRESSION / ASSESSMENT AND PLAN / ED COURSE  Pertinent labs & imaging results that were available during my care of the patient were reviewed by me and considered in my medical decision making (see chart for details).  Once the patient is out of the hallway bed and changed into a gown, I will perform a rectal  exam to make sure she is not heme positive.  Most of her symptoms appear chronic but she reports increasing pain as well as some nausea and vomiting over the last few days.  I will likely proceed with a CT scan for further evaluation.  Her labs are unremarkable and her vital signs are stable.   Clinical Course as of Nov 04 2043  Tue Nov 03, 2016  [id number] Hemoccult negative, will proceed with CT abd/pelvis  [CF]  2040 The patient is complaining of some mild dizziness that is  consistent with her usual vertigo.  I will give her a dose of meclizine.  She has been watching TV and no acute distress for the last couple of hours.  She reports that she feels better and I gave her the good news that her CT scan is unremarkable with no sign of acute illness.  She does have some diverticulosis which could account for some of the dark stools that she has seen recently although she is heme negative at this time.  I encouraged her to follow up with her primary care doctor.  She has no signs of acute or emergent medical condition at this time and is safe and appropriate for discharge.  [CF]    Clinical Course User Index [CF] Hinda Kehr, MD    ____________________________________________  FINAL CLINICAL IMPRESSION(S) / ED DIAGNOSES  Final diagnoses:  Generalized abdominal pain  Nausea and vomiting, intractability of vomiting not specified, unspecified vomiting type  Diverticulosis of intestine without bleeding, unspecified intestinal tract location     MEDICATIONS GIVEN DURING THIS VISIT:  Medications  iopamidol (ISOVUE-300) 61 % injection 30 mL (30 mLs Oral Contrast Given 11/03/16 1843)  iopamidol (ISOVUE-300) 61 % injection 100 mL (100 mLs Intravenous Contrast Given 11/03/16 1927)  meclizine (ANTIVERT) tablet 25 mg (25 mg Oral Given 11/03/16 2043)     NEW OUTPATIENT MEDICATIONS STARTED DURING THIS VISIT:  New Prescriptions   MECLIZINE (ANTIVERT) 25 MG TABLET    Take 1 tablet (25 mg total) by mouth 3 (three) times daily as needed for dizziness.    Modified Medications   No medications on file    Discontinued Medications   No medications on file     Note:  This document was prepared using Dragon voice recognition software and may include unintentional dictation errors.    Hinda Kehr, MD 11/03/16 2045

## 2016-11-03 NOTE — ED Triage Notes (Signed)
Black stools X "months" per patient. Abdominal pain, nausea. Pt states she was sent by doctor here via cab.

## 2016-11-03 NOTE — ED Notes (Signed)
Pt verbalized having no way to get home. Charge made aware and taxi voucher  Given to pt.

## 2016-11-03 NOTE — Discharge Instructions (Signed)
Your workup was reassuring today.  Your labs are all normal, and your CT scan showed some diverticulosis but no evidence of infection, obstruction, or other emergent process.  Additionally, your rectal exam showed that you have NO rectal bleeding at this time.   We recommend that you take your usual medications and the prescribed meclizine as needed for dizziness/vertigo.  Follow up with your regular doctor at the next available appointment.    Return to the emergency department if you develop new or worsening symptoms that concern you.

## 2016-11-03 NOTE — ED Notes (Signed)
Patient transported to CT 

## 2016-11-03 NOTE — ED Notes (Addendum)
Pt presents to ED with multiple medical complaints. Pt verbalized abd pain and right shoulder pain that radiates into right arm and hand. Pt also verbalized nausea and vomiting and states she can not keep anything down. Pt reports symptoms have been present since 10/21/16. Pt also verbalized having dizziness that worsened when attempting to stand or ambulate. Pt in NAD at this time. Pt talking without difficulty and A&O x 4. No acute neuro deficits noted at this time

## 2017-01-07 ENCOUNTER — Emergency Department: Payer: Medicaid Other

## 2017-01-07 ENCOUNTER — Emergency Department
Admission: EM | Admit: 2017-01-07 | Discharge: 2017-01-07 | Disposition: A | Discharge status: Home / Self Care | Payer: Medicaid Other | Attending: Emergency Medicine | Admitting: Emergency Medicine

## 2017-01-07 ENCOUNTER — Encounter: Payer: Self-pay | Admitting: Emergency Medicine

## 2017-01-07 DIAGNOSIS — R0602 Shortness of breath: Secondary | ICD-10-CM | POA: Diagnosis present

## 2017-01-07 DIAGNOSIS — E119 Type 2 diabetes mellitus without complications: Secondary | ICD-10-CM | POA: Insufficient documentation

## 2017-01-07 DIAGNOSIS — I10 Essential (primary) hypertension: Secondary | ICD-10-CM | POA: Diagnosis not present

## 2017-01-07 DIAGNOSIS — B349 Viral infection, unspecified: Secondary | ICD-10-CM | POA: Diagnosis not present

## 2017-01-07 DIAGNOSIS — J45901 Unspecified asthma with (acute) exacerbation: Secondary | ICD-10-CM | POA: Insufficient documentation

## 2017-01-07 DIAGNOSIS — Z79899 Other long term (current) drug therapy: Secondary | ICD-10-CM | POA: Diagnosis not present

## 2017-01-07 HISTORY — DX: Unspecified asthma, uncomplicated: J45.909

## 2017-01-07 LAB — BASIC METABOLIC PANEL
Anion gap: 8 (ref 5–15)
BUN: 15 mg/dL (ref 6–20)
CALCIUM: 8.6 mg/dL — AB (ref 8.9–10.3)
CO2: 28 mmol/L (ref 22–32)
CREATININE: 0.88 mg/dL (ref 0.44–1.00)
Chloride: 104 mmol/L (ref 101–111)
Glucose, Bld: 131 mg/dL — ABNORMAL HIGH (ref 65–99)
Potassium: 3.6 mmol/L (ref 3.5–5.1)
SODIUM: 140 mmol/L (ref 135–145)

## 2017-01-07 LAB — INFLUENZA PANEL BY PCR (TYPE A & B)
INFLAPCR: NEGATIVE
Influenza B By PCR: NEGATIVE

## 2017-01-07 LAB — CBC WITH DIFFERENTIAL/PLATELET
BASOS PCT: 1 %
Basophils Absolute: 0.1 10*3/uL (ref 0–0.1)
EOS ABS: 0.2 10*3/uL (ref 0–0.7)
EOS PCT: 2 %
HCT: 40 % (ref 35.0–47.0)
HEMOGLOBIN: 13.5 g/dL (ref 12.0–16.0)
LYMPHS ABS: 4.6 10*3/uL — AB (ref 1.0–3.6)
Lymphocytes Relative: 51 %
MCH: 34.4 pg — ABNORMAL HIGH (ref 26.0–34.0)
MCHC: 33.7 g/dL (ref 32.0–36.0)
MCV: 101.9 fL — ABNORMAL HIGH (ref 80.0–100.0)
Monocytes Absolute: 0.6 10*3/uL (ref 0.2–0.9)
Monocytes Relative: 6 %
NEUTROS PCT: 40 %
Neutro Abs: 3.7 10*3/uL (ref 1.4–6.5)
PLATELETS: 174 10*3/uL (ref 150–440)
RBC: 3.93 MIL/uL (ref 3.80–5.20)
RDW: 13.3 % (ref 11.5–14.5)
WBC: 9.1 10*3/uL (ref 3.6–11.0)

## 2017-01-07 LAB — TROPONIN I

## 2017-01-07 MED ORDER — PREDNISONE 20 MG PO TABS: 60.0000 mg | ORAL_TABLET | Freq: Every day | ORAL | 0 refills | Status: AC

## 2017-01-07 MED ORDER — ONDANSETRON HCL 4 MG/2ML IJ SOLN
4.0000 mg | Freq: Once | INTRAMUSCULAR | Status: AC
  Administered 2017-01-07: 4 mg via INTRAVENOUS
  Filled 2017-01-07: qty 2

## 2017-01-07 MED ORDER — IPRATROPIUM-ALBUTEROL 0.5-2.5 (3) MG/3ML IN SOLN
3.0000 mL | Freq: Once | RESPIRATORY_TRACT | Status: AC
  Administered 2017-01-07: 3 mL via RESPIRATORY_TRACT
  Filled 2017-01-07: qty 3

## 2017-01-07 MED ORDER — SODIUM CHLORIDE 0.9 % IV BOLUS (SEPSIS)
1000.0000 mL | Freq: Once | INTRAVENOUS | Status: AC
  Administered 2017-01-07: 1000 mL via INTRAVENOUS

## 2017-01-07 MED ORDER — ALBUTEROL SULFATE HFA 108 (90 BASE) MCG/ACT IN AERS: 2.0000 | INHALATION_SPRAY | Freq: Four times a day (QID) | RESPIRATORY_TRACT | 2 refills | Status: DC | PRN

## 2017-01-07 NOTE — ED Provider Notes (Signed)
[facility] Emergency Department Provider Note  ____________________________________________  Time seen: Approximately 11:10 AM  I have reviewed the triage vital signs and the nursing notes.   HISTORY  Chief Complaint Shortness of Breath   HPI Jaime Fitzgerald is a 56 y.o. female the history of asthma, diabetes, and hypertension who presents for evaluation of flulike symptoms. Patient reports a cough productive of yellow sputum for the last 10 days. For the last 2 days she has had subjective fever and chills, today had 3 episodes of nonbloody nonbilious emesis and 3 of watery diarrhea. No melena, no hematemesis, no coffee-ground emesis. She has been wheezing and using her inhalers. She is complaining of severe shortness of breath. Per EMS patient had significant wheezing upon their initial evaluation. She received 125 mg of Solu-Medrol and 2 albuterols in route. She is complaining of mild chest tightness located in the center of her chest intermittent that improves with her breathing treatments.  Past Medical History:  Diagnosis Date  . Asthma   . Diabetes mellitus without complication ([place])   . Hypertension   . Seizures ([place])     There are no active problems to display for this patient.   Past Surgical History:  Procedure Laterality Date  . CARPAL TUNNEL RELEASE    . TONSILLECTOMY      Prior to Admission medications   Medication Sig Start Date End Date Taking? Authorizing Provider  albuterol (PROVENTIL HFA;VENTOLIN HFA) 108 (90 Base) MCG/ACT inhaler Inhale 2 puffs into the lungs every 6 (six) hours as needed for wheezing or shortness of breath. 01/07/17   Rudene Re, MD  azithromycin (ZITHROMAX Z-PAK) 250 MG tablet Take 2 tablets (500 mg) on  Day 1,  followed by 1 tablet (250 mg) once daily on Days 2 through 5. 12/24/15   Jami L Hagler, PA-C  chlorpheniramine-HYDROcodone (TUSSIONEX PENNKINETIC ER) 10-8 MG/5ML SUER Take 5 mLs by mouth every 12  (twelve) hours as needed for cough. 12/24/15   Jami L Hagler, PA-C  meclizine (ANTIVERT) 25 MG tablet Take 1 tablet (25 mg total) by mouth 3 (three) times daily as needed for dizziness. 11/03/16   Hinda Kehr, MD  oseltamivir (TAMIFLU) 75 MG capsule Take 1 capsule (75 mg total) by mouth 2 (two) times daily. 12/24/15   Jami L Hagler, PA-C  predniSONE (DELTASONE) 20 MG tablet Take 3 tablets (60 mg total) by mouth daily. 01/07/17 01/11/17  Rudene Re, MD    Allergies Penicillins  No family history on file.  Social History Social History  Substance Use Topics  . Smoking status: Never Smoker  . Smokeless tobacco: Never Used  . Alcohol use No    Review of Systems  Constitutional: + fever, body aches, chills Eyes: Negative for visual changes. ENT: Negative for sore throat. Neck: No neck pain  Cardiovascular: + chest pain. Respiratory: + shortness of breath, wheezing, cough Gastrointestinal: Negative for abdominal pain. + vomiting and diarrhea. Genitourinary: Negative for dysuria. Musculoskeletal: Negative for back pain. Skin: Negative for rash. Neurological: Negative for headaches, weakness or numbness. Psych: No SI or HI  ____________________________________________   PHYSICAL EXAM:  VITAL SIGNS: ED Triage Vitals  Enc Vitals Group     BP 01/07/17 1105 (!) 148/81     Pulse Rate 01/07/17 1105 64     Resp 01/07/17 1105 20     Temp 01/07/17 1105 98.2 F (36.8 C)     Temp Source 01/07/17 1105 Oral     SpO2 01/07/17 1105 95 %  Weight 01/07/17 1105 180 lb (81.6 kg)     Height 01/07/17 1105 5\' 3"  (1.6 m)     Head Circumference --      Peak Flow --      Pain Score 01/07/17 1104 9     Pain Loc --      Pain Edu? --      Excl. in [place]? --     Constitutional: Alert and oriented. Well appearing and in no apparent distress. HEENT:      Head: Normocephalic and atraumatic.         Eyes: Conjunctivae are normal. Sclera is non-icteric. EOMI. PERRL      Mouth/Throat: Mucous  membranes are moist.       Neck: Supple with no signs of meningismus. Cardiovascular: Regular rate and rhythm. No murmurs, gallops, or rubs. 2+ symmetrical distal pulses are present in all extremities. No JVD. Respiratory: Normal respiratory effort. Lungs are clear to auscultation bilaterally with faint expiratory wheezes.  Gastrointestinal: Soft, non tender, and non distended with positive bowel sounds. No rebound or guarding. Musculoskeletal: Nontender with normal range of motion in all extremities. No edema, cyanosis, or erythema of extremities. Neurologic: Normal speech and language. Face is symmetric. Moving all extremities. No gross focal neurologic deficits are appreciated. Skin: Skin is warm, dry and intact. No rash noted. Psychiatric: Mood and affect are normal. Speech and behavior are normal.  ____________________________________________   LABS (all labs ordered are listed, but only abnormal results are displayed)  Labs Reviewed  CBC WITH DIFFERENTIAL/PLATELET - Abnormal; Notable for the following:       Result Value   MCV 101.9 (*)    MCH 34.4 (*)    Lymphs Abs 4.6 (*)    All other components within normal limits  BASIC METABOLIC PANEL - Abnormal; Notable for the following:    Glucose, Bld 131 (*)    Calcium 8.6 (*)    All other components within normal limits  TROPONIN I  INFLUENZA PANEL BY PCR (TYPE A & B)   ____________________________________________  EKG  ED ECG REPORT I, Rudene Re, the attending physician, personally viewed and interpreted this ECG.  Normal sinus rhythm, rate of 62, normal intervals, normal axis, no ST elevations or depressions, diffuse T-wave flattening.  ____________________________________________  RADIOLOGY  CXR: Negative  ____________________________________________   PROCEDURES  Procedure(s) performed: None Procedures Critical Care performed:  None ____________________________________________   INITIAL IMPRESSION /  ASSESSMENT AND PLAN / ED COURSE   56 y.o. female the history of asthma, diabetes, and hypertension who presents for evaluation of flulike symptoms and asthma exacerbation. Patient at this time has just faint expiratory wheezes after receiving 2 albuterol treatments per EMS but continues to complain of shortness of breath. We'll check her for the flu, chest x-ray to rule out pneumonia, labs to rule out dehydration or electrolyte abnormalities. Will give duoneb, IVF, IV zofran.  Clinical Course as of Jan 08 1303  Thu Jan 07, 2017  1303 Patient feels markedly improved. Vital signs are within normal limits. Normal work of breathing, she is moving great air with no wheezing. Fluids negative, chest x-ray negative for pneumonia. Patient be discharged home on albuterol, steroid course and follow up with PCP.  [CV]    Clinical Course User Index [CV] Rudene Re, MD    Pertinent labs & imaging results that were available during my care of the patient were reviewed by me and considered in my medical decision making (see chart for details).  ____________________________________________   FINAL CLINICAL IMPRESSION(S) / ED DIAGNOSES  Final diagnoses:  Viral illness  Exacerbation of asthma, unspecified asthma severity, unspecified whether persistent      NEW MEDICATIONS STARTED DURING THIS VISIT:  New Prescriptions   ALBUTEROL (PROVENTIL HFA;VENTOLIN HFA) 108 (90 BASE) MCG/ACT INHALER    Inhale 2 puffs into the lungs every 6 (six) hours as needed for wheezing or shortness of breath.   PREDNISONE (DELTASONE) 20 MG TABLET    Take 3 tablets (60 mg total) by mouth daily.     Note:  This document was prepared using Dragon voice recognition software and may include unintentional dictation errors.    Rudene Re, MD 01/07/17 1304

## 2017-01-07 NOTE — Discharge Instructions (Signed)

## 2017-01-07 NOTE — ED Triage Notes (Signed)
Patient presents to the ED with shortness of breath and chest tightness x 1.5 week.  Patient has history of asthma per EMS.  EMS gave patient 2 albuterol treatments and 125mg  of solumedrol.  Per EMS patient's oxygen level was 99% on their arrival.  Patient is alert and oriented x 4.  Patient reports productive cough with yellow sputum.

## 2017-03-22 ENCOUNTER — Emergency Department: Payer: Medicaid Other

## 2017-03-22 ENCOUNTER — Encounter: Payer: Self-pay | Admitting: Emergency Medicine

## 2017-03-22 ENCOUNTER — Emergency Department
Admission: EM | Admit: 2017-03-22 | Discharge: 2017-03-23 | Disposition: A | Discharge status: Home / Self Care | Payer: Medicaid Other | Attending: Emergency Medicine | Admitting: Emergency Medicine

## 2017-03-22 DIAGNOSIS — Z79899 Other long term (current) drug therapy: Secondary | ICD-10-CM | POA: Insufficient documentation

## 2017-03-22 DIAGNOSIS — F1490 Cocaine use, unspecified, uncomplicated: Secondary | ICD-10-CM | POA: Insufficient documentation

## 2017-03-22 DIAGNOSIS — R569 Unspecified convulsions: Secondary | ICD-10-CM | POA: Diagnosis present

## 2017-03-22 DIAGNOSIS — E119 Type 2 diabetes mellitus without complications: Secondary | ICD-10-CM | POA: Insufficient documentation

## 2017-03-22 DIAGNOSIS — R55 Syncope and collapse: Secondary | ICD-10-CM | POA: Diagnosis not present

## 2017-03-22 DIAGNOSIS — J45909 Unspecified asthma, uncomplicated: Secondary | ICD-10-CM | POA: Insufficient documentation

## 2017-03-22 DIAGNOSIS — I10 Essential (primary) hypertension: Secondary | ICD-10-CM | POA: Insufficient documentation

## 2017-03-22 LAB — CBC WITH DIFFERENTIAL/PLATELET
BASOS ABS: 0.1 10*3/uL (ref 0–0.1)
Basophils Relative: 1 %
Eosinophils Absolute: 0.2 10*3/uL (ref 0–0.7)
Eosinophils Relative: 3 %
HEMATOCRIT: 38.2 % (ref 35.0–47.0)
Hemoglobin: 12.9 g/dL (ref 12.0–16.0)
LYMPHS ABS: 3.8 10*3/uL — AB (ref 1.0–3.6)
LYMPHS PCT: 48 %
MCH: 34.4 pg — AB (ref 26.0–34.0)
MCHC: 33.7 g/dL (ref 32.0–36.0)
MCV: 102.3 fL — AB (ref 80.0–100.0)
MONO ABS: 0.4 10*3/uL (ref 0.2–0.9)
Monocytes Relative: 5 %
NEUTROS ABS: 3.4 10*3/uL (ref 1.4–6.5)
Neutrophils Relative %: 43 %
Platelets: 189 10*3/uL (ref 150–440)
RBC: 3.73 MIL/uL — ABNORMAL LOW (ref 3.80–5.20)
RDW: 13.8 % (ref 11.5–14.5)
WBC: 7.8 10*3/uL (ref 3.6–11.0)

## 2017-03-22 LAB — URINE DRUG SCREEN, QUALITATIVE (ARMC ONLY)
AMPHETAMINES, UR SCREEN: NOT DETECTED
BENZODIAZEPINE, UR SCRN: NOT DETECTED
Barbiturates, Ur Screen: NOT DETECTED
COCAINE METABOLITE, UR ~~LOC~~: POSITIVE — AB
Cannabinoid 50 Ng, Ur ~~LOC~~: NOT DETECTED
MDMA (ECSTASY) UR SCREEN: NOT DETECTED
METHADONE SCREEN, URINE: NOT DETECTED
OPIATE, UR SCREEN: NOT DETECTED
Phencyclidine (PCP) Ur S: NOT DETECTED
Tricyclic, Ur Screen: NOT DETECTED

## 2017-03-22 LAB — ETHANOL: Alcohol, Ethyl (B): 74 mg/dL — ABNORMAL HIGH (ref <5)

## 2017-03-22 NOTE — ED Notes (Signed)
Patient has been taken to imaging. 

## 2017-03-22 NOTE — ED Provider Notes (Signed)
[facility] Emergency Department Provider Note  ____________________________________________   First MD Initiated Contact with Patient 03/22/17 2155     (approximate)  I have reviewed the triage vital signs and the nursing notes.   HISTORY  Chief Complaint Seizures   HPI Jaime Fitzgerald is a 56 y.o. female who presents to the emergency department for evaluation after seizure post fall. Patient states that she had sat down on the edge of her bathtub to put her socks on and must have lost her balance because when she awakened she was in the bathtub. She does not recall falling. She states that she had to crawl to the door to let EMS in to pick her up. She states that she has had a seizure in the past but it has been "long time." She states that she does take medication, but didn't take it last night because she forgot.   Past Medical History:  Diagnosis Date  . Asthma   . Diabetes mellitus without complication ([place])   . Hypertension   . Seizures ([place])     There are no active problems to display for this patient.   Past Surgical History:  Procedure Laterality Date  . CARPAL TUNNEL RELEASE    . TONSILLECTOMY      Prior to Admission medications   Medication Sig Start Date End Date Taking? Authorizing Provider  albuterol (PROVENTIL HFA;VENTOLIN HFA) 108 (90 Base) MCG/ACT inhaler Inhale 2 puffs into the lungs every 6 (six) hours as needed for wheezing or shortness of breath. 01/07/17   Rudene Re, MD  azithromycin (ZITHROMAX Z-PAK) 250 MG tablet Take 2 tablets (500 mg) on  Day 1,  followed by 1 tablet (250 mg) once daily on Days 2 through 5. 12/24/15   Hagler, Jami L, PA-C  chlorpheniramine-HYDROcodone (TUSSIONEX PENNKINETIC ER) 10-8 MG/5ML SUER Take 5 mLs by mouth every 12 (twelve) hours as needed for cough. 12/24/15   Hagler, Jami L, PA-C  meclizine (ANTIVERT) 25 MG tablet Take 1 tablet (25 mg total) by mouth 3 (three) times daily as needed for  dizziness. 11/03/16   Hinda Kehr, MD  oseltamivir (TAMIFLU) 75 MG capsule Take 1 capsule (75 mg total) by mouth 2 (two) times daily. 12/24/15   Hagler, Jami L, PA-C    Allergies Penicillins  History reviewed. No pertinent family history.  Social History Social History  Substance Use Topics  . Smoking status: Never Smoker  . Smokeless tobacco: Never Used  . Alcohol use Yes    Review of Systems  Constitutional: No fever/chills Eyes: No visual changes. ENT: No sore throat. Cardiovascular: Denies chest pain. Respiratory: Denies shortness of breath. Gastrointestinal: No abdominal pain.  No nausea, no vomiting.  No diarrhea.   Genitourinary: Negative for dysuria. Musculoskeletal: Negative for back pain. Skin: Negative for rash. Neurological: Negative for headaches, focal weakness or numbness. No loss of bowel or bladder control during syncopal episode. ___________________________________________   PHYSICAL EXAM:  VITAL SIGNS: ED Triage Vitals [03/22/17 2141]  Enc Vitals Group     BP 132/84      Pulse 74      Resp 16      Temp 98      Temp src      SpO2 98%      Weight 180 lb (81.6 kg)     Height      Head Circumference      Peak Flow      Pain Score  Pain Loc      Pain Edu?      Excl. in [place]?     Constitutional: Alert and oriented. Chronically ill appearing and in no acute distress. Eyes: Conjunctivae are anicteric, but erythematous. PERRL. EOMI. Head: Hematoma present on the left occiput Nose: No congestion/rhinnorhea. No epistaxis. Mouth/Throat: Mucous membranes are moist. Aroma of EtOH Neck: No stridor.  Nexus criteria is negative. Cardiovascular: Normal rate, regular rhythm. Grossly normal heart sounds.  Good peripheral circulation. Respiratory: Normal respiratory effort.  No retractions. Lungs CTAB. Gastrointestinal: Soft, rotund, bowel sounds present and active 4 quadrants. Musculoskeletal: Tenderness to palpation over the right proximal  fibula. Neurologic:  Normal speech and language. No gross focal neurologic deficits are appreciated. GCS 15. Skin:  Skin is warm, dry and intact. No rash noted. Psychiatric: Mood and affect are normal. Speech and behavior are normal.  ____________________________________________   LABS (all labs ordered are listed, but only abnormal results are displayed)  Labs Reviewed  ETHANOL - Abnormal; Notable for the following:       Result Value   Alcohol, Ethyl (B) 74 (*)    All other components within normal limits  URINE DRUG SCREEN, QUALITATIVE ([facility] ONLY) - Abnormal; Notable for the following:    Cocaine Metabolite,Ur [place] POSITIVE (*)    All other components within normal limits  COMPREHENSIVE METABOLIC PANEL - Abnormal; Notable for the following:    Potassium 3.4 (*)    BUN 28 (*)    Creatinine, Ser 1.61 (*)    Calcium 8.8 (*)    GFR calc non Af Amer 34 (*)    GFR calc Af Amer 40 (*)    All other components within normal limits  CBC WITH DIFFERENTIAL/PLATELET - Abnormal; Notable for the following:    RBC 3.73 (*)    MCV 102.3 (*)    MCH 34.4 (*)    Lymphs Abs 3.8 (*)    All other components within normal limits  TROPONIN I  TROPONIN I  CBG MONITORING, ED   ____________________________________________  EKG  Sinus rhythm with rate of 76; occasional PVC ____________________________________________  RADIOLOGY  Dg Tibia/fibula Right  Result Date: 03/22/2017 CLINICAL DATA:  56 year old female with fall and right lower extremity pain. EXAM: RIGHT TIBIA AND FIBULA - 2 VIEW COMPARISON:  None. FINDINGS: There is no evidence of fracture or other focal bone lesions. Soft tissues are unremarkable. IMPRESSION: Negative. Electronically Signed   By: Anner Crete M.D.   On: 03/22/2017 22:27   Ct Head Wo Contrast  Result Date: 03/22/2017 CLINICAL DATA:  56 year old female with seizure and fall. EXAM: CT HEAD WITHOUT CONTRAST TECHNIQUE: Contiguous axial images were obtained from the  base of the skull through the vertex without intravenous contrast. COMPARISON:  Brain MRI dated 03/11/2016 FINDINGS: Brain: No evidence of acute infarction, hemorrhage, hydrocephalus, extra-axial collection or mass lesion/mass effect. Vascular: No hyperdense vessel or unexpected calcification. Skull: Normal. Negative for fracture or focal lesion. Sinuses/Orbits: No acute finding. Other: None. IMPRESSION: No acute intracranial pathology. Electronically Signed   By: Anner Crete M.D.   On: 03/22/2017 22:30    ____________________________________________   PROCEDURES  Procedure(s) performed: None  Procedures  Critical Care performed: No  ____________________________________________   INITIAL IMPRESSION / ASSESSMENT AND PLAN / ED COURSE  Pertinent labs & imaging results that were available during my care of the patient were reviewed by me and considered in my medical decision making (see chart for details).  56 year old female who presents to the  emergency department for evaluation after a syncopal episode this evening. She isn't sure if she had a seizure or just passed out. Labs, head CT, and urinalysis requested. ----------------------------------------- 12:02 AM on 03/23/2017 -----------------------------------------  No further syncopal or seizure activity while in the emergency department. Awaiting lab results. Of note, urinalysis is positive for cocaine and ethanol level is 74.  ----------------------------------------- 2:55 AM on 03/23/2017 -----------------------------------------  Second troponin is negative. She has been resting comfortably since arrival without any additional complaints. She will be discharged home and instructed to follow up with [facility] for cocaine use and her PCP. She was advised to return to the ER for symptoms of concern if unable to schedule an appointment. ___________________________________________   FINAL CLINICAL IMPRESSION(S) / ED  DIAGNOSES  Final diagnoses:  Syncope, unspecified syncope type  Cocaine use    NEW MEDICATIONS STARTED DURING THIS VISIT:  New Prescriptions   No medications on file     Note:  This document was prepared using Dragon voice recognition software and may include unintentional dictation errors.    Victorino Dike, FNP 03/23/17 Anola Gurney    Hinda Kehr, MD 03/30/17 2007

## 2017-03-22 NOTE — ED Triage Notes (Signed)
Pt arrived to ED per EMS from home, post fall seizure. EMS reports pt crawled to door to let them in, per pt she was in bathroom when she woke up in tub. ETOH on board, pt reports "1 small beer." Pt is A&O x1, unable to to give place, date, or time, pt is oriented to person. Strong odor of alcohol when assessing pt upon arrival to ED.

## 2017-03-22 NOTE — ED Notes (Signed)
CBG 128 

## 2017-03-23 LAB — COMPREHENSIVE METABOLIC PANEL
ALT: 16 U/L (ref 14–54)
ANION GAP: 10 (ref 5–15)
AST: 22 U/L (ref 15–41)
Albumin: 4 g/dL (ref 3.5–5.0)
Alkaline Phosphatase: 54 U/L (ref 38–126)
BUN: 28 mg/dL — ABNORMAL HIGH (ref 6–20)
CHLORIDE: 109 mmol/L (ref 101–111)
CO2: 22 mmol/L (ref 22–32)
CREATININE: 1.61 mg/dL — AB (ref 0.44–1.00)
Calcium: 8.8 mg/dL — ABNORMAL LOW (ref 8.9–10.3)
GFR, EST AFRICAN AMERICAN: 40 mL/min — AB (ref >60)
GFR, EST NON AFRICAN AMERICAN: 34 mL/min — AB (ref >60)
Glucose, Bld: 96 mg/dL (ref 65–99)
POTASSIUM: 3.4 mmol/L — AB (ref 3.5–5.1)
Sodium: 141 mmol/L (ref 135–145)
Total Bilirubin: 0.4 mg/dL (ref 0.3–1.2)
Total Protein: 7.1 g/dL (ref 6.5–8.1)

## 2017-03-23 LAB — TROPONIN I: Troponin I: <0.03 ng/mL (ref <0.03)

## 2017-03-23 MED ORDER — SODIUM CHLORIDE 0.9 % IV BOLUS (SEPSIS)
1000.0000 mL | Freq: Once | INTRAVENOUS | Status: AC
Start: 2017-03-23 — End: 2017-03-23
  Administered 2017-03-23: 1000 mL via INTRAVENOUS

## 2017-03-23 NOTE — Discharge Instructions (Signed)
Please call schedule follow-up plan with her primary care provider.  Seek counseling for cocaine use by calling [facility].  Return to the emergency department for symptoms that change or worsen if you're unable to schedule an appointment with primary care or [facility].

## 2017-07-19 ENCOUNTER — Emergency Department
Admission: EM | Admit: 2017-07-19 | Discharge: 2017-07-19 | Disposition: A | Discharge status: Home / Self Care | Payer: Medicaid Other | Attending: Emergency Medicine | Admitting: Emergency Medicine

## 2017-07-19 DIAGNOSIS — L0201 Cutaneous abscess of face: Secondary | ICD-10-CM | POA: Diagnosis not present

## 2017-07-19 DIAGNOSIS — Z79899 Other long term (current) drug therapy: Secondary | ICD-10-CM | POA: Diagnosis not present

## 2017-07-19 DIAGNOSIS — E119 Type 2 diabetes mellitus without complications: Secondary | ICD-10-CM | POA: Diagnosis not present

## 2017-07-19 DIAGNOSIS — J45909 Unspecified asthma, uncomplicated: Secondary | ICD-10-CM | POA: Insufficient documentation

## 2017-07-19 DIAGNOSIS — I10 Essential (primary) hypertension: Secondary | ICD-10-CM | POA: Insufficient documentation

## 2017-07-19 LAB — CBC WITH DIFFERENTIAL/PLATELET
BASOS PCT: 1 %
Basophils Absolute: 0 10*3/uL (ref 0–0.1)
Eosinophils Absolute: 0.2 10*3/uL (ref 0–0.7)
Eosinophils Relative: 3 %
HCT: 42.9 % (ref 35.0–47.0)
HEMOGLOBIN: 14.2 g/dL (ref 12.0–16.0)
LYMPHS ABS: 1.9 10*3/uL (ref 1.0–3.6)
Lymphocytes Relative: 31 %
MCH: 34.4 pg — ABNORMAL HIGH (ref 26.0–34.0)
MCHC: 33.1 g/dL (ref 32.0–36.0)
MCV: 103.9 fL — ABNORMAL HIGH (ref 80.0–100.0)
MONOS PCT: 6 %
Monocytes Absolute: 0.4 10*3/uL (ref 0.2–0.9)
NEUTROS ABS: 3.7 10*3/uL (ref 1.4–6.5)
NEUTROS PCT: 59 %
Platelets: 182 10*3/uL (ref 150–440)
RBC: 4.13 MIL/uL (ref 3.80–5.20)
RDW: 13.3 % (ref 11.5–14.5)
WBC: 6.1 10*3/uL (ref 3.6–11.0)

## 2017-07-19 LAB — COMPREHENSIVE METABOLIC PANEL
ALT: 13 U/L — ABNORMAL LOW (ref 14–54)
ANION GAP: 8 (ref 5–15)
AST: 21 U/L (ref 15–41)
Albumin: 4.2 g/dL (ref 3.5–5.0)
Alkaline Phosphatase: 69 U/L (ref 38–126)
BUN: 23 mg/dL — ABNORMAL HIGH (ref 6–20)
CALCIUM: 9.5 mg/dL (ref 8.9–10.3)
CHLORIDE: 104 mmol/L (ref 101–111)
CO2: 27 mmol/L (ref 22–32)
Creatinine, Ser: 1.09 mg/dL — ABNORMAL HIGH (ref 0.44–1.00)
GFR calc non Af Amer: 55 mL/min — ABNORMAL LOW (ref >60)
Glucose, Bld: 113 mg/dL — ABNORMAL HIGH (ref 65–99)
Potassium: 5 mmol/L (ref 3.5–5.1)
SODIUM: 139 mmol/L (ref 135–145)
Total Bilirubin: 0.3 mg/dL (ref 0.3–1.2)
Total Protein: 7.5 g/dL (ref 6.5–8.1)

## 2017-07-19 LAB — LACTIC ACID, PLASMA: LACTIC ACID, VENOUS: 1.3 mmol/L (ref 0.5–1.9)

## 2017-07-19 MED ORDER — LIDOCAINE HCL (PF) 1 % IJ SOLN
INTRAMUSCULAR | Status: AC
  Filled 2017-07-19: qty 5

## 2017-07-19 NOTE — ED Triage Notes (Signed)
Pt arrives to ER via POV c/o cyst to right side of face and jaw line. Pt is taking PO Bactrim but states area is getting no better. Pt alert and oriented X4, active, cooperative, pt in NAD. RR even and unlabored, color WNL.

## 2017-07-19 NOTE — ED Notes (Signed)
Pt discharged home after verbalizing understanding of discharge instructions; nad noted. 

## 2017-07-19 NOTE — ED Provider Notes (Signed)
[facility] Emergency Department Provider Note   ____________________________________________    I have reviewed the triage vital signs and the nursing notes.   HISTORY  Chief Complaint Cyst     HPI Jaime Fitzgerald is a 56 y.o. female Who presents with complaints of an abscess on her right jawline. Patient reports she saw her doctor who put her on Bactrim but this has not helped. She reports it is worsened. She reports over the last week it has been getting larger and larger, it is painful. She denies fevers or chills. She does have a history of diabetes.   Past Medical History:  Diagnosis Date  . Asthma   . Diabetes mellitus without complication ([place])   . Hypertension   . Seizures ([place])     There are no active problems to display for this patient.   Past Surgical History:  Procedure Laterality Date  . CARPAL TUNNEL RELEASE    . TONSILLECTOMY      Prior to Admission medications   Medication Sig Start Date End Date Taking? Authorizing Provider  albuterol (PROVENTIL HFA;VENTOLIN HFA) 108 (90 Base) MCG/ACT inhaler Inhale 2 puffs into the lungs every 6 (six) hours as needed for wheezing or shortness of breath. 01/07/17   Rudene Re, MD  azithromycin (ZITHROMAX Z-PAK) 250 MG tablet Take 2 tablets (500 mg) on  Day 1,  followed by 1 tablet (250 mg) once daily on Days 2 through 5. 12/24/15   Hagler, Jami L, PA-C  chlorpheniramine-HYDROcodone (TUSSIONEX PENNKINETIC ER) 10-8 MG/5ML SUER Take 5 mLs by mouth every 12 (twelve) hours as needed for cough. 12/24/15   Hagler, Jami L, PA-C  meclizine (ANTIVERT) 25 MG tablet Take 1 tablet (25 mg total) by mouth 3 (three) times daily as needed for dizziness. 11/03/16   Hinda Kehr, MD  oseltamivir (TAMIFLU) 75 MG capsule Take 1 capsule (75 mg total) by mouth 2 (two) times daily. 12/24/15   Hagler, Jami L, PA-C     Allergies Penicillins  No family history on file.  Social History Social History    Substance Use Topics  . Smoking status: Never Smoker  . Smokeless tobacco: Never Used  . Alcohol use Yes    Review of Systems  Constitutional: No fever/chills  ENT: abscess as above   Gastrointestinal: No abdominal pain.  No nausea, no vomiting.   Musculoskeletal: Negative for back pain. Skin: abscess as above Neurological: Negative for headaches     ____________________________________________   PHYSICAL EXAM:  VITAL SIGNS: ED Triage Vitals  Enc Vitals Group     BP 07/19/17 1030 (!) 158/85     Pulse Rate 07/19/17 1030 75     Resp 07/19/17 1030 15     Temp 07/19/17 1030 98.7 F (37.1 C)     Temp Source 07/19/17 1030 Oral     SpO2 07/19/17 1030 97 %     Weight 07/19/17 1031 81.6 kg (180 lb)     Height 07/19/17 1031 1.6 m (5\' 3" )     Head Circumference --      Peak Flow --      Pain Score 07/19/17 1034 10     Pain Loc --      Pain Edu? --      Excl. in [place]? --     Constitutional: Alert and oriented. No acute distress. Pleasant and interactive Eyes: Conjunctivae are normal.   Nose: No congestion/rhinnorhea. Mouth/Throat: Mucous membranes are moist.   Cardiovascular: Normal rate, regular  rhythm.  Respiratory: Normal respiratory effort.  No retractions. Genitourinary: deferred Musculoskeletal: No lower extremity tenderness nor edema.   Neurologic:  Normal speech and language. No gross focal neurologic deficits are appreciated.   Skin:  Skin is warm, dry and intact.patient with large erythematous fluctuant likely abscess in the right central jawline. No intraoral involvement, no surrounding erythema. No drainage. No pulsatility   ____________________________________________   LABS (all labs ordered are listed, but only abnormal results are displayed)  Labs Reviewed  COMPREHENSIVE METABOLIC PANEL - Abnormal; Notable for the following:       Result Value   Glucose, Bld 113 (*)    BUN 23 (*)    Creatinine, Ser 1.09 (*)    ALT 13 (*)    GFR calc non Af  Amer 55 (*)    All other components within normal limits  CBC WITH DIFFERENTIAL/PLATELET - Abnormal; Notable for the following:    MCV 103.9 (*)    MCH 34.4 (*)    All other components within normal limits  AEROBIC/ANAEROBIC CULTURE (SURGICAL/DEEP WOUND)  LACTIC ACID, PLASMA   ____________________________________________  EKG   ____________________________________________  RADIOLOGY  None ____________________________________________   PROCEDURES  Procedure(s) performed: yes  INCISION AND DRAINAGE Performed by: Lavonia Drafts Consent: Verbal consent obtained. Risks and benefits: risks, benefits and alternatives were discussed Type: abscess  Body area: face  Anesthesia: local infiltration  Initially I aspirated with 18 g needle to confirm abscess, then Incision was made with a scalpel.  Local anesthetic: lidocaine 1% w epinephrine  Anesthetic total: 3 ml  Complexity: complex Blunt dissection to break up loculations  Drainage: purulent  Drainage amount: large  Patient tolerance: Patient tolerated the procedure well with no immediate complications.   Critical Care performed: No ____________________________________________   INITIAL IMPRESSION / ASSESSMENT AND PLAN / ED COURSE  Pertinent labs & imaging results that were available during my care of the patient were reviewed by me and considered in my medical decision making (see chart for details).  patient with large abscess on the right jawline, aspirated purulent material then used 11 blade to create an incision, large amount of material drained, patient tolerated well. She will continue her Bactrim wound recheck in 2 days   ____________________________________________   FINAL CLINICAL IMPRESSION(S) / ED DIAGNOSES  Final diagnoses:  Facial abscess      NEW MEDICATIONS STARTED DURING THIS VISIT:  Discharge Medication List as of 07/19/2017 12:44 PM       Note:  This document was prepared  using Dragon voice recognition software and may include unintentional dictation errors.    Lavonia Drafts, MD 07/19/17 1336

## 2017-07-19 NOTE — ED Notes (Signed)
Pt presents with abscess to her face, along the right jaw line. Pt does not feel like her teeth or gums are involved. She saw [facility] on Wednesday and was prescribed bactrim, which she has been taking with no relief. Pt states she wants to have it lanced.

## 2017-07-24 LAB — AEROBIC/ANAEROBIC CULTURE W GRAM STAIN (SURGICAL/DEEP WOUND)

## 2017-07-24 LAB — AEROBIC/ANAEROBIC CULTURE (SURGICAL/DEEP WOUND)

## 2018-10-15 IMAGING — DX DG TIBIA/FIBULA 2V*R*
2 series · 2 of 2 positions shown · non-contrast
Comparison: None.

CLINICAL DATA: 56-year-old female with fall and right lower
extremity pain.

EXAM:
RIGHT TIBIA AND FIBULA - 2 VIEW

[tibia ap]
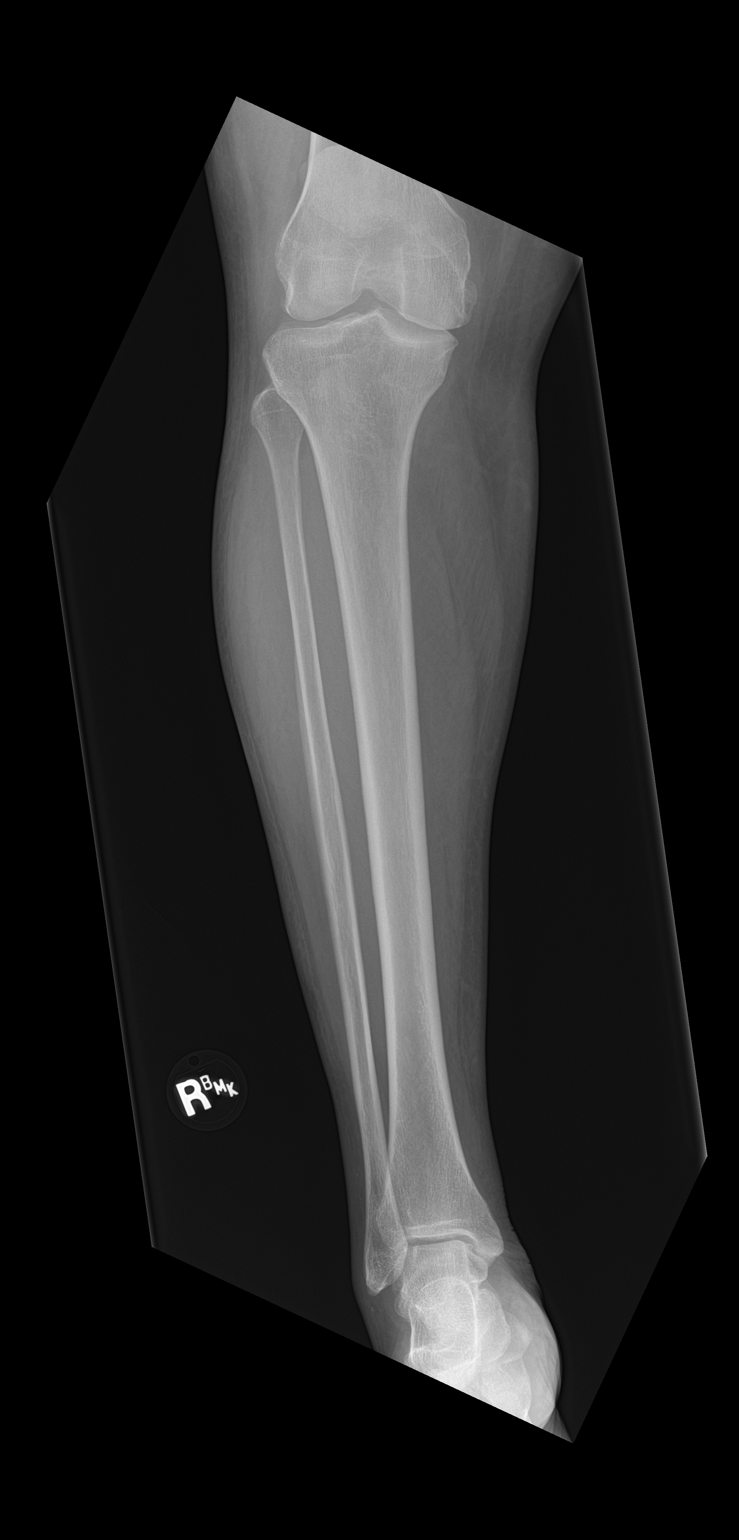

[tibia lat]
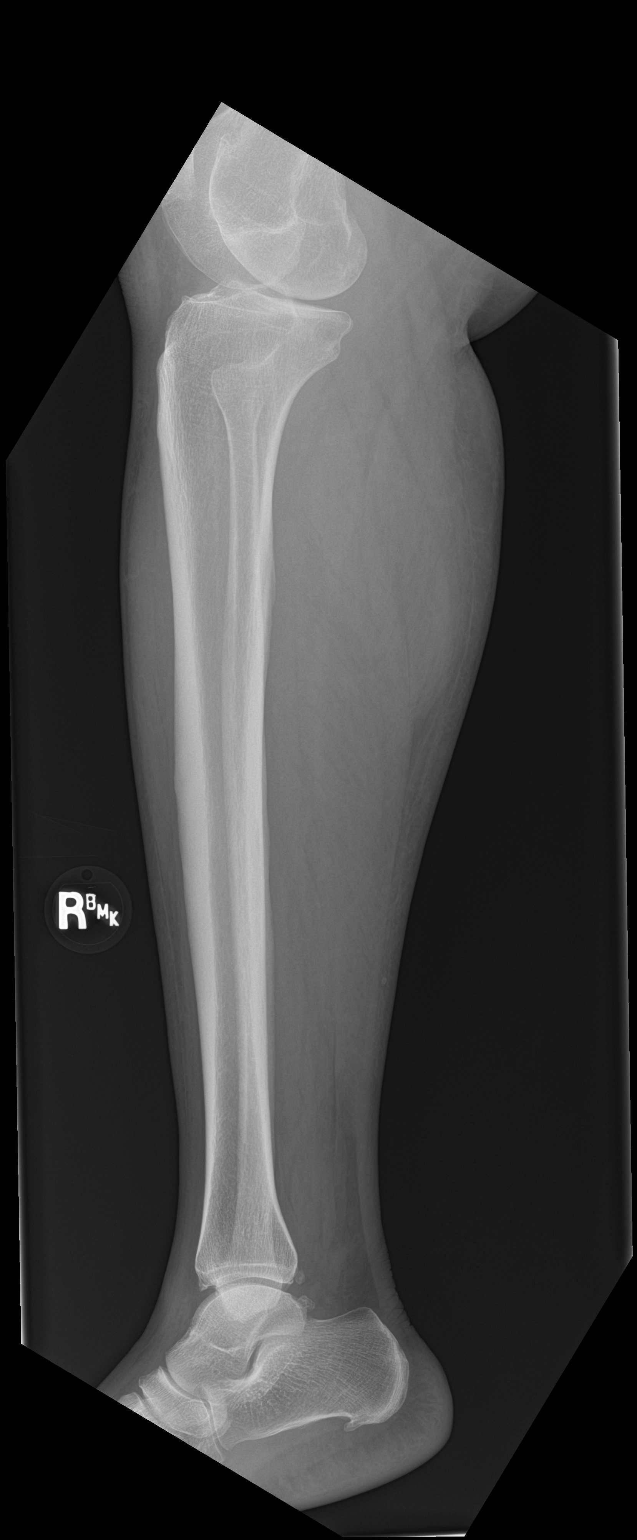

[2 of 2 positions shown; findings below may reference images not displayed]

FINDINGS: There is no evidence of fracture or other focal bone lesions. Soft
tissues are unremarkable.
IMPRESSION: Negative.

## 2019-04-18 ENCOUNTER — Emergency Department
Admission: EM | Admit: 2019-04-18 | Discharge: 2019-04-18 | Disposition: A | Discharge status: Home / Self Care | Payer: Medicaid Other | Attending: Emergency Medicine | Admitting: Emergency Medicine

## 2019-04-18 ENCOUNTER — Other Ambulatory Visit: Payer: Self-pay

## 2019-04-18 ENCOUNTER — Encounter: Payer: Self-pay | Admitting: Emergency Medicine

## 2019-04-18 ENCOUNTER — Emergency Department: Payer: Medicaid Other

## 2019-04-18 DIAGNOSIS — I10 Essential (primary) hypertension: Secondary | ICD-10-CM | POA: Insufficient documentation

## 2019-04-18 DIAGNOSIS — R202 Paresthesia of skin: Secondary | ICD-10-CM | POA: Insufficient documentation

## 2019-04-18 DIAGNOSIS — E119 Type 2 diabetes mellitus without complications: Secondary | ICD-10-CM | POA: Insufficient documentation

## 2019-04-18 DIAGNOSIS — J45909 Unspecified asthma, uncomplicated: Secondary | ICD-10-CM | POA: Insufficient documentation

## 2019-04-18 DIAGNOSIS — R200 Anesthesia of skin: Secondary | ICD-10-CM | POA: Diagnosis present

## 2019-04-18 LAB — HEPATIC FUNCTION PANEL
ALT: 13 U/L (ref 0–44)
AST: 20 U/L (ref 15–41)
Albumin: 4.2 g/dL (ref 3.5–5.0)
Alkaline Phosphatase: 63 U/L (ref 38–126)
Bilirubin, Direct: <0.1 mg/dL (ref 0.0–0.2)
Total Bilirubin: 0.4 mg/dL (ref 0.3–1.2)
Total Protein: 7.2 g/dL (ref 6.5–8.1)

## 2019-04-18 LAB — BASIC METABOLIC PANEL
Anion gap: 8 (ref 5–15)
BUN: 15 mg/dL (ref 6–20)
CO2: 26 mmol/L (ref 22–32)
Calcium: 10.1 mg/dL (ref 8.9–10.3)
Chloride: 106 mmol/L (ref 98–111)
Creatinine, Ser: 1.04 mg/dL — ABNORMAL HIGH (ref 0.44–1.00)
GFR calc Af Amer: >60 mL/min (ref >60)
GFR calc non Af Amer: 58 mL/min — ABNORMAL LOW (ref >60)
Glucose, Bld: 100 mg/dL — ABNORMAL HIGH (ref 70–99)
Potassium: 4.1 mmol/L (ref 3.5–5.1)
Sodium: 140 mmol/L (ref 135–145)

## 2019-04-18 LAB — CBC WITH DIFFERENTIAL/PLATELET
Abs Immature Granulocytes: 0.05 10*3/uL (ref 0.00–0.07)
Basophils Absolute: 0 10*3/uL (ref 0.0–0.1)
Basophils Relative: 0 %
Eosinophils Absolute: 0.2 10*3/uL (ref 0.0–0.5)
Eosinophils Relative: 2 %
HCT: 44.8 % (ref 36.0–46.0)
Hemoglobin: 14.5 g/dL (ref 12.0–15.0)
Immature Granulocytes: 1 %
Lymphocytes Relative: 40 %
Lymphs Abs: 3.5 10*3/uL (ref 0.7–4.0)
MCH: 34.5 pg — ABNORMAL HIGH (ref 26.0–34.0)
MCHC: 32.4 g/dL (ref 30.0–36.0)
MCV: 106.7 fL — ABNORMAL HIGH (ref 80.0–100.0)
Monocytes Absolute: 0.5 10*3/uL (ref 0.1–1.0)
Monocytes Relative: 6 %
Neutro Abs: 4.5 10*3/uL (ref 1.7–7.7)
Neutrophils Relative %: 51 %
Platelets: 222 10*3/uL (ref 150–400)
RBC: 4.2 MIL/uL (ref 3.87–5.11)
RDW: 13.1 % (ref 11.5–15.5)
WBC: 8.7 10*3/uL (ref 4.0–10.5)
nRBC: 0 % (ref 0.0–0.2)

## 2019-04-18 LAB — LIPASE, BLOOD: Lipase: 31 U/L (ref 11–51)

## 2019-04-18 MED ORDER — ONDANSETRON HCL 4 MG/2ML IJ SOLN
4.0000 mg | Freq: Once | INTRAMUSCULAR | Status: AC
  Administered 2019-04-18: 12:00:00 4 mg via INTRAVENOUS
  Filled 2019-04-18: qty 2

## 2019-04-18 MED ORDER — FENTANYL CITRATE (PF) 100 MCG/2ML IJ SOLN
50.0000 ug | Freq: Once | INTRAMUSCULAR | Status: AC
  Administered 2019-04-18: 12:00:00 50 ug via INTRAVENOUS
  Filled 2019-04-18: qty 2

## 2019-04-18 NOTE — ED Notes (Signed)
As per patient had multiple surgeries done to her right hand always feels a shooting type sensation on that hand even after surgery. Reports pain has progressively become worse. Pain now shoots up her right arm into right side of neck, also shoots down her right side leg and abd regions, reports bloating and pain. Reports chills x 4 days vomiting last night., denies cough, sob, fever, diarrhea. Awaiting md eval and plan of care. As per patient had appointment today but missed the bus was going to be late to her [place] appointment and was afraid would not be seen today. Reports came to ed instead.

## 2019-04-18 NOTE — ED Triage Notes (Signed)
C/O right arm, right leg pain and abdominal pain x 3 weeks.  Patient has an appointment with PCP today at 130, but states she was unable to tolerate the pain any longer.

## 2019-04-18 NOTE — ED Provider Notes (Signed)
[facility] Emergency Department Provider Note  ____________________________________________   First MD Initiated Contact with Patient 04/18/19 1122     (approximate)  I have reviewed the triage vital signs and the nursing notes.   HISTORY  Chief Complaint right sided pain    HPI Jaime Fitzgerald is a 58 y.o. female with diabetes, hypertension, asthma who presents for right-sided pain.  Patient had right-sided numbness and tingling on her body for the past 4 week.  She states she was trying to follow-up outpatient but the pain related to it made her want to seek attention now. Patient's arm tingling is from below her elbow and down to hand.  And the tingling in her leg  is mostly concentrated on the right knee but occasionally goes up into her hip.  It is constant but intermittently worsens, severe, nothing makes it better at home, nothing seems to bring it on. She describes a little bit of fullness when she tries to eat but is not had any focal abdominal pain.  No fevers.  No vomiting.            Past Medical History:  Diagnosis Date   Asthma    Diabetes mellitus without complication ([place])    Hypertension    Seizures ([place])     There are no active problems to display for this patient.   Past Surgical History:  Procedure Laterality Date   CARPAL TUNNEL RELEASE     TONSILLECTOMY      Prior to Admission medications   Medication Sig Start Date End Date Taking? Authorizing Provider  albuterol (PROVENTIL HFA;VENTOLIN HFA) 108 (90 Base) MCG/ACT inhaler Inhale 2 puffs into the lungs every 6 (six) hours as needed for wheezing or shortness of breath. 01/07/17   Rudene Re, MD  azithromycin (ZITHROMAX Z-PAK) 250 MG tablet Take 2 tablets (500 mg) on  Day 1,  followed by 1 tablet (250 mg) once daily on Days 2 through 5. 12/24/15   Hagler, Jami L, PA-C  chlorpheniramine-HYDROcodone (TUSSIONEX PENNKINETIC ER) 10-8 MG/5ML SUER Take 5 mLs by mouth every  12 (twelve) hours as needed for cough. 12/24/15   Hagler, Jami L, PA-C  meclizine (ANTIVERT) 25 MG tablet Take 1 tablet (25 mg total) by mouth 3 (three) times daily as needed for dizziness. 11/03/16   Hinda Kehr, MD  oseltamivir (TAMIFLU) 75 MG capsule Take 1 capsule (75 mg total) by mouth 2 (two) times daily. 12/24/15   Hagler, Jami L, PA-C    Allergies Penicillins  No family history on file.  Social History Social History   Tobacco Use   Smoking status: Never Smoker   Smokeless tobacco: Never Used  Substance Use Topics   Alcohol use: Yes   Drug use: Not on file      Review of Systems Constitutional: No fever/chills Eyes: No visual changes. ENT: No sore throat. Cardiovascular: Denies chest pain. Respiratory: Denies shortness of breath. Gastrointestinal: No abdominal pain.  No nausea, no vomiting.  No diarrhea.  No constipation. +fullness after eating Genitourinary: Negative for dysuria. Musculoskeletal: Negative for back pain. Skin: Negative for rash. Neurological: Negative for headaches, focal weakness + tingling sensation. All other ROS negative ____________________________________________   PHYSICAL EXAM:  VITAL SIGNS: ED Triage Vitals  Enc Vitals Group     BP 04/18/19 1052 (!) 154/83     Pulse Rate 04/18/19 1052 77     Resp 04/18/19 1052 16     Temp 04/18/19 1052 98 F (36.7 C)  Temp Source 04/18/19 1052 Oral     SpO2 04/18/19 1052 95 %     Weight 04/18/19 1053 179 lb 14.3 oz (81.6 kg)     Height --      Head Circumference --      Peak Flow --      Pain Score 04/18/19 1051 10     Pain Loc --      Pain Edu? --      Excl. in [place]? --     Constitutional: Alert and oriented. Well appearing and in no acute distress. Eyes: Conjunctivae are normal. EOMI. Head: Atraumatic. Nose: No congestion/rhinnorhea. Mouth/Throat: Mucous membranes are moist.   Neck: No stridor. Trachea Midline. FROM Cardiovascular: Normal rate, regular rhythm. Grossly normal  heart sounds.  Good peripheral circulation. Respiratory: Normal respiratory effort.  No retractions. Lungs CTAB. Gastrointestinal: Soft and nontender. No distention. No abdominal bruits.  Musculoskeletal: No lower extremity tenderness nor edema.  No joint effusions. Old surgical scars on the right hand.  Neurologic:  Normal speech and language. Reported sensation changes from elbow to hand, and around knee area. Strength appears equal in hands maybe slightly decreased in the L leg but seems more related to pain. Otherwise CN exam is normal. Ambulates well.  Skin:  Skin is warm, dry and intact. No rash noted. Psychiatric: Mood and affect are normal. Speech and behavior are normal. GU: Deferred   ____________________________________________   LABS (all labs ordered are listed, but only abnormal results are displayed)  Labs Reviewed  CBC WITH DIFFERENTIAL/PLATELET - Abnormal; Notable for the following components:      Result Value   MCV 106.7 (*)    MCH 34.5 (*)    All other components within normal limits  BASIC METABOLIC PANEL - Abnormal; Notable for the following components:   Glucose, Bld 100 (*)    Creatinine, Ser 1.04 (*)    GFR calc non Af Amer 58 (*)    All other components within normal limits  HEPATIC FUNCTION PANEL  LIPASE, BLOOD   ____________________________________________   ED ECG REPORT I, Vanessa Franklin, the attending physician, personally viewed and interpreted this ECG.  EKG normal sinus rate of 73, no ST elevation, no T wave inversion, left axis deviation. ____________________________________________  RADIOLOGY   Official radiology report(s): Ct Head Wo Contrast  Result Date: 04/18/2019 CLINICAL DATA:  Shooting pains in the right arm and right neck. EXAM: CT HEAD WITHOUT CONTRAST CT CERVICAL SPINE WITHOUT CONTRAST TECHNIQUE: Multidetector CT imaging of the head and cervical spine was performed following the standard protocol without intravenous contrast.  Multiplanar CT image reconstructions of the cervical spine were also generated. COMPARISON:  None. FINDINGS: CT HEAD FINDINGS Brain: No evidence of acute infarction, hemorrhage, hydrocephalus, extra-axial collection or mass lesion/mass effect. There is chronic diffuse atrophy. Vascular: No hyperdense vessel is noted. Skull: Normal. Negative for fracture or focal lesion. Sinuses/Orbits: No acute finding. Other: None CT CERVICAL SPINE FINDINGS Alignment: There is kyphosis of cervical spine. Skull base and vertebrae: No acute fracture. No primary bone lesion or focal pathologic process. Soft tissues and spinal canal: No prevertebral fluid or swelling. No visible canal hematoma. Disc levels: There are degenerative joint changes most prominently involving the C4 through C7 levels with marked narrowed joint space osteophyte formation and neural foraminal narrowing. Upper chest: Negative. Other: None IMPRESSION: No focal acute intracranial abnormality identified. No acute fracture or dislocation of cervical spine. 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 Electronically Signed  By: Abelardo Diesel M.D.   On: 04/18/2019 12:17   Ct Cervical Spine Wo Contrast  Result Date: 04/18/2019 CLINICAL DATA:  Shooting pains in the right arm and right neck. EXAM: CT HEAD WITHOUT CONTRAST CT CERVICAL SPINE WITHOUT CONTRAST TECHNIQUE: Multidetector CT imaging of the head and cervical spine was performed following the standard protocol without intravenous contrast. Multiplanar CT image reconstructions of the cervical spine were also generated. COMPARISON:  None. FINDINGS: CT HEAD FINDINGS Brain: No evidence of acute infarction, hemorrhage, hydrocephalus, extra-axial collection or mass lesion/mass effect. There is chronic diffuse atrophy. Vascular: No hyperdense vessel is noted. Skull: Normal. Negative for fracture or focal lesion. Sinuses/Orbits: No acute finding. Other: None CT CERVICAL SPINE FINDINGS  Alignment: There is kyphosis of cervical spine. Skull base and vertebrae: No acute fracture. No primary bone lesion or focal pathologic process. Soft tissues and spinal canal: No prevertebral fluid or swelling. No visible canal hematoma. Disc levels: There are degenerative joint changes most prominently involving the C4 through C7 levels with marked narrowed joint space osteophyte formation and neural foraminal narrowing. Upper chest: Negative. Other: None IMPRESSION: No focal acute intracranial abnormality identified. No acute fracture or dislocation of cervical spine. 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 Electronically Signed   By: Abelardo Diesel M.D.   On: 04/18/2019 12:17    ____________________________________________   PROCEDURES  Procedure(s) performed (including Critical Care):  Procedures   ____________________________________________   INITIAL IMPRESSION / ASSESSMENT AND PLAN / ED COURSE  VERBENA BOEDING was evaluated in Emergency Department on 04/18/2019 for the symptoms described in the history of present illness. She was evaluated in the context of the global COVID-19 pandemic, which necessitated consideration that the patient might be at risk for infection with the SARS-CoV-2 virus that causes COVID-19. Institutional protocols and algorithms that pertain to the evaluation of patients at risk for COVID-19 are in a state of rapid change based on information released by regulatory bodies including the CDC and federal and state organizations. These policies and algorithms were followed during the patient's care in the ED.    Patient symptoms on the right side will get CT imaging to rule out old stroke, mass.  Will get CT cervical to any kind of cervical fracture or mass but low suspicion given she denies any falls.  Will get basic labs to evaluate for electrolyte abnormalities.       12:55 PM Reevaluated patient.  Patient is feeling better.  Updated  patient on CT imaging was negative and labs are reassuring.  Unclear what is actually causing patient's tingling sensation.  Given the symptoms and going on for so long I have very low suspicion for this being caused by a stroke that would not of been seen on CT head given neuro exam otherwise re-assuring.  I Do not think an MRI is necessary at this time.  Seems more peripheral nature given that it is from the elbow and below and mostly concentrated on her right knee.  Patient is able to ambulate around the room.  At this time will discharge patient home. Pt will f.u with PCP and ortho doc.  ____________________________________________   FINAL CLINICAL IMPRESSION(S) / ED DIAGNOSES   Final diagnoses:  Tingling in extremities      MEDICATIONS GIVEN DURING THIS VISIT:  Medications  fentaNYL (SUBLIMAZE) injection 50 mcg (50 mcg Intravenous Given 04/18/19 1148)  ondansetron (ZOFRAN) injection 4 mg (4 mg Intravenous Given 04/18/19 1148)     ED  Discharge Orders    None       Note:  This document was prepared using Dragon voice recognition software and may include unintentional dictation errors.   Vanessa Sebring, MD 04/18/19 [phone]

## 2019-04-18 NOTE — Discharge Instructions (Addendum)
Your CT imaging was reassuring.  You should follow-up with your primary doctor if your symptoms are continuing.  You may need more outpatient testing.  Return to the ER for slurred speech or any other concerns.

## 2019-06-13 ENCOUNTER — Other Ambulatory Visit: Payer: Self-pay

## 2019-06-13 DIAGNOSIS — Z20822 Contact with and (suspected) exposure to covid-19: Secondary | ICD-10-CM

## 2019-06-14 LAB — NOVEL CORONAVIRUS, NAA: SARS-CoV-2, NAA: NOT DETECTED

## 2019-06-19 ENCOUNTER — Telehealth: Payer: Self-pay

## 2019-06-19 NOTE — Telephone Encounter (Signed)
Patient called and received her covid test result.

## 2019-10-11 ENCOUNTER — Encounter
Admission: EM | Disposition: A | Discharge status: Home / Self Care | Payer: Self-pay | Source: Home / Self Care | Attending: Internal Medicine

## 2019-10-11 ENCOUNTER — Inpatient Hospital Stay: Payer: Medicaid Other | Admitting: Anesthesiology

## 2019-10-11 ENCOUNTER — Ambulatory Visit: Admit: 2019-10-11 | Payer: Medicaid Other | Admitting: Gastroenterology

## 2019-10-11 ENCOUNTER — Encounter: Payer: Self-pay | Admitting: *Deleted

## 2019-10-11 ENCOUNTER — Inpatient Hospital Stay
Admission: EM | Admit: 2019-10-11 | Discharge: 2019-10-14 | DRG: 378 | Disposition: A | Discharge status: Home / Self Care | Payer: Medicaid Other | Attending: Internal Medicine | Admitting: Internal Medicine

## 2019-10-11 ENCOUNTER — Other Ambulatory Visit: Payer: Self-pay

## 2019-10-11 ENCOUNTER — Emergency Department: Payer: Medicaid Other

## 2019-10-11 DIAGNOSIS — E872 Acidosis: Secondary | ICD-10-CM | POA: Diagnosis present

## 2019-10-11 DIAGNOSIS — E861 Hypovolemia: Secondary | ICD-10-CM | POA: Diagnosis present

## 2019-10-11 DIAGNOSIS — K253 Acute gastric ulcer without hemorrhage or perforation: Secondary | ICD-10-CM | POA: Diagnosis not present

## 2019-10-11 DIAGNOSIS — I959 Hypotension, unspecified: Secondary | ICD-10-CM | POA: Diagnosis present

## 2019-10-11 DIAGNOSIS — D62 Acute posthemorrhagic anemia: Secondary | ICD-10-CM | POA: Diagnosis present

## 2019-10-11 DIAGNOSIS — Z79899 Other long term (current) drug therapy: Secondary | ICD-10-CM | POA: Diagnosis not present

## 2019-10-11 DIAGNOSIS — Z23 Encounter for immunization: Secondary | ICD-10-CM | POA: Diagnosis not present

## 2019-10-11 DIAGNOSIS — J45901 Unspecified asthma with (acute) exacerbation: Secondary | ICD-10-CM | POA: Diagnosis present

## 2019-10-11 DIAGNOSIS — Z20822 Contact with and (suspected) exposure to covid-19: Secondary | ICD-10-CM | POA: Diagnosis present

## 2019-10-11 DIAGNOSIS — M47816 Spondylosis without myelopathy or radiculopathy, lumbar region: Secondary | ICD-10-CM | POA: Diagnosis present

## 2019-10-11 DIAGNOSIS — Z880 Allergy status to penicillin: Secondary | ICD-10-CM | POA: Diagnosis not present

## 2019-10-11 DIAGNOSIS — K922 Gastrointestinal hemorrhage, unspecified: Secondary | ICD-10-CM | POA: Diagnosis not present

## 2019-10-11 DIAGNOSIS — Z7984 Long term (current) use of oral hypoglycemic drugs: Secondary | ICD-10-CM | POA: Diagnosis not present

## 2019-10-11 DIAGNOSIS — M542 Cervicalgia: Secondary | ICD-10-CM | POA: Diagnosis present

## 2019-10-11 DIAGNOSIS — D509 Iron deficiency anemia, unspecified: Secondary | ICD-10-CM | POA: Diagnosis present

## 2019-10-11 DIAGNOSIS — K760 Fatty (change of) liver, not elsewhere classified: Secondary | ICD-10-CM | POA: Diagnosis present

## 2019-10-11 DIAGNOSIS — I10 Essential (primary) hypertension: Secondary | ICD-10-CM | POA: Diagnosis present

## 2019-10-11 DIAGNOSIS — Z6833 Body mass index (BMI) 33.0-33.9, adult: Secondary | ICD-10-CM

## 2019-10-11 DIAGNOSIS — Z886 Allergy status to analgesic agent status: Secondary | ICD-10-CM | POA: Diagnosis not present

## 2019-10-11 DIAGNOSIS — G40909 Epilepsy, unspecified, not intractable, without status epilepticus: Secondary | ICD-10-CM | POA: Diagnosis present

## 2019-10-11 DIAGNOSIS — R55 Syncope and collapse: Secondary | ICD-10-CM | POA: Diagnosis not present

## 2019-10-11 DIAGNOSIS — E669 Obesity, unspecified: Secondary | ICD-10-CM | POA: Diagnosis present

## 2019-10-11 DIAGNOSIS — N179 Acute kidney failure, unspecified: Secondary | ICD-10-CM | POA: Diagnosis present

## 2019-10-11 DIAGNOSIS — G8929 Other chronic pain: Secondary | ICD-10-CM | POA: Diagnosis present

## 2019-10-11 DIAGNOSIS — R1084 Generalized abdominal pain: Secondary | ICD-10-CM | POA: Diagnosis present

## 2019-10-11 DIAGNOSIS — M5430 Sciatica, unspecified side: Secondary | ICD-10-CM | POA: Diagnosis present

## 2019-10-11 DIAGNOSIS — K250 Acute gastric ulcer with hemorrhage: Secondary | ICD-10-CM | POA: Diagnosis present

## 2019-10-11 DIAGNOSIS — E119 Type 2 diabetes mellitus without complications: Secondary | ICD-10-CM | POA: Diagnosis not present

## 2019-10-11 HISTORY — PX: ESOPHAGOGASTRODUODENOSCOPY (EGD) WITH PROPOFOL: SHX5813

## 2019-10-11 HISTORY — DX: Acute posthemorrhagic anemia: D62

## 2019-10-11 HISTORY — DX: Gastrointestinal hemorrhage, unspecified: K92.2

## 2019-10-11 LAB — COMPREHENSIVE METABOLIC PANEL
ALT: 14 U/L (ref 0–44)
AST: 28 U/L (ref 15–41)
Albumin: 3.3 g/dL — ABNORMAL LOW (ref 3.5–5.0)
Alkaline Phosphatase: 45 U/L (ref 38–126)
Anion gap: 10 (ref 5–15)
BUN: 50 mg/dL — ABNORMAL HIGH (ref 6–20)
CO2: 28 mmol/L (ref 22–32)
Calcium: 8.5 mg/dL — ABNORMAL LOW (ref 8.9–10.3)
Chloride: 102 mmol/L (ref 98–111)
Creatinine, Ser: 1.45 mg/dL — ABNORMAL HIGH (ref 0.44–1.00)
GFR calc Af Amer: 45 mL/min — ABNORMAL LOW (ref >60)
GFR calc non Af Amer: 39 mL/min — ABNORMAL LOW (ref >60)
Glucose, Bld: 140 mg/dL — ABNORMAL HIGH (ref 70–99)
Potassium: 3.6 mmol/L (ref 3.5–5.1)
Sodium: 140 mmol/L (ref 135–145)
Total Bilirubin: 0.5 mg/dL (ref 0.3–1.2)
Total Protein: 5.8 g/dL — ABNORMAL LOW (ref 6.5–8.1)

## 2019-10-11 LAB — CBC
HCT: 29.7 % — ABNORMAL LOW (ref 36.0–46.0)
Hemoglobin: 9.5 g/dL — ABNORMAL LOW (ref 12.0–15.0)
MCH: 33.3 pg (ref 26.0–34.0)
MCHC: 32 g/dL (ref 30.0–36.0)
MCV: 104.2 fL — ABNORMAL HIGH (ref 80.0–100.0)
Platelets: 163 10*3/uL (ref 150–400)
RBC: 2.85 MIL/uL — ABNORMAL LOW (ref 3.87–5.11)
RDW: 14 % (ref 11.5–15.5)
WBC: 18.2 10*3/uL — ABNORMAL HIGH (ref 4.0–10.5)
nRBC: 0.2 % (ref 0.0–0.2)

## 2019-10-11 LAB — CBC WITH DIFFERENTIAL/PLATELET
Abs Immature Granulocytes: 0.34 10*3/uL — ABNORMAL HIGH (ref 0.00–0.07)
Basophils Absolute: 0 10*3/uL (ref 0.0–0.1)
Basophils Relative: 0 %
Eosinophils Absolute: 0.1 10*3/uL (ref 0.0–0.5)
Eosinophils Relative: 0 %
HCT: 26.5 % — ABNORMAL LOW (ref 36.0–46.0)
Hemoglobin: 8.6 g/dL — ABNORMAL LOW (ref 12.0–15.0)
Immature Granulocytes: 2 %
Lymphocytes Relative: 16 %
Lymphs Abs: 2.7 10*3/uL (ref 0.7–4.0)
MCH: 34.1 pg — ABNORMAL HIGH (ref 26.0–34.0)
MCHC: 32.5 g/dL (ref 30.0–36.0)
MCV: 105.2 fL — ABNORMAL HIGH (ref 80.0–100.0)
Monocytes Absolute: 0.8 10*3/uL (ref 0.1–1.0)
Monocytes Relative: 5 %
Neutro Abs: 13 10*3/uL — ABNORMAL HIGH (ref 1.7–7.7)
Neutrophils Relative %: 77 %
Platelets: 210 10*3/uL (ref 150–400)
RBC: 2.52 MIL/uL — ABNORMAL LOW (ref 3.87–5.11)
RDW: 13.2 % (ref 11.5–15.5)
WBC: 16.9 10*3/uL — ABNORMAL HIGH (ref 4.0–10.5)
nRBC: 0 % (ref 0.0–0.2)

## 2019-10-11 LAB — RESPIRATORY PANEL BY RT PCR (FLU A&B, COVID)
Influenza A by PCR: NEGATIVE
Influenza B by PCR: NEGATIVE
SARS Coronavirus 2 by RT PCR: NEGATIVE

## 2019-10-11 LAB — IRON AND TIBC
Iron: 92 ug/dL (ref 28–170)
Saturation Ratios: 44 % — ABNORMAL HIGH (ref 10.4–31.8)
TIBC: 210 ug/dL — ABNORMAL LOW (ref 250–450)
UIBC: 118 ug/dL

## 2019-10-11 LAB — HIV ANTIBODY (ROUTINE TESTING W REFLEX): HIV Screen 4th Generation wRfx: NONREACTIVE

## 2019-10-11 LAB — PROTIME-INR
INR: 1.1 (ref 0.8–1.2)
Prothrombin Time: 14 seconds (ref 11.4–15.2)

## 2019-10-11 LAB — ABO/RH: ABO/RH(D): O POS

## 2019-10-11 LAB — VITAMIN B12: Vitamin B-12: 137 pg/mL — ABNORMAL LOW (ref 180–914)

## 2019-10-11 LAB — LIPASE, BLOOD: Lipase: 31 U/L (ref 11–51)

## 2019-10-11 LAB — GLUCOSE, CAPILLARY
Glucose-Capillary: 136 mg/dL — ABNORMAL HIGH (ref 70–99)
Glucose-Capillary: 154 mg/dL — ABNORMAL HIGH (ref 70–99)

## 2019-10-11 LAB — HEMOGLOBIN AND HEMATOCRIT, BLOOD
HCT: 28.7 % — ABNORMAL LOW (ref 36.0–46.0)
Hemoglobin: 9.1 g/dL — ABNORMAL LOW (ref 12.0–15.0)

## 2019-10-11 LAB — PREPARE RBC (CROSSMATCH)

## 2019-10-11 LAB — LACTIC ACID, PLASMA
Lactic Acid, Venous: 3.4 mmol/L (ref 0.5–1.9)
Lactic Acid, Venous: 1.5 mmol/L (ref 0.5–1.9)

## 2019-10-11 LAB — FERRITIN: Ferritin: 50 ng/mL (ref 11–307)

## 2019-10-11 SURGERY — ESOPHAGOGASTRODUODENOSCOPY (EGD) WITH PROPOFOL
Anesthesia: General

## 2019-10-11 MED ORDER — ALBUTEROL SULFATE (2.5 MG/3ML) 0.083% IN NEBU: 3.0000 mL | INHALATION_SOLUTION | Freq: Four times a day (QID) | RESPIRATORY_TRACT | Status: DC | PRN

## 2019-10-11 MED ORDER — PNEUMOCOCCAL VAC POLYVALENT 25 MCG/0.5ML IJ INJ
0.5000 mL | INJECTION | INTRAMUSCULAR | Status: AC
  Administered 2019-10-13: 0.5 mL via INTRAMUSCULAR
  Filled 2019-10-11: qty 0.5

## 2019-10-11 MED ORDER — IOHEXOL 350 MG/ML SOLN
75.0000 mL | Freq: Once | INTRAVENOUS | Status: AC | PRN
  Administered 2019-10-11: 08:00:00 75 mL via INTRAVENOUS

## 2019-10-11 MED ORDER — DEXAMETHASONE SODIUM PHOSPHATE 10 MG/ML IJ SOLN
INTRAMUSCULAR | Status: DC | PRN
  Administered 2019-10-11: 4 mg via INTRAVENOUS

## 2019-10-11 MED ORDER — MORPHINE SULFATE (PF) 4 MG/ML IV SOLN
4.0000 mg | Freq: Once | INTRAVENOUS | Status: AC
  Administered 2019-10-11: 4 mg via INTRAVENOUS
  Filled 2019-10-11: qty 1

## 2019-10-11 MED ORDER — TOPIRAMATE 100 MG PO TABS
200.0000 mg | ORAL_TABLET | Freq: Two times a day (BID) | ORAL | Status: DC
  Administered 2019-10-12 – 2019-10-14 (×5): 200 mg via ORAL
  Filled 2019-10-11 (×7): qty 2

## 2019-10-11 MED ORDER — PRAVASTATIN SODIUM 40 MG PO TABS
40.0000 mg | ORAL_TABLET | Freq: Every day | ORAL | Status: DC
  Administered 2019-10-12 – 2019-10-14 (×3): 40 mg via ORAL
  Filled 2019-10-11 (×3): qty 1

## 2019-10-11 MED ORDER — PROPOFOL 500 MG/50ML IV EMUL
INTRAVENOUS | Status: AC
  Filled 2019-10-11: qty 50

## 2019-10-11 MED ORDER — GABAPENTIN 300 MG PO CAPS
300.0000 mg | ORAL_CAPSULE | Freq: Three times a day (TID) | ORAL | Status: DC
  Administered 2019-10-12 – 2019-10-14 (×4): 300 mg via ORAL
  Filled 2019-10-11 (×4): qty 1

## 2019-10-11 MED ORDER — FENTANYL CITRATE (PF) 100 MCG/2ML IJ SOLN
INTRAMUSCULAR | Status: DC | PRN
  Administered 2019-10-11: 50 ug via INTRAVENOUS
  Administered 2019-10-11: 25 ug via INTRAVENOUS

## 2019-10-11 MED ORDER — LIDOCAINE HCL URETHRAL/MUCOSAL 2 % EX GEL
CUTANEOUS | Status: AC
  Filled 2019-10-11: qty 5

## 2019-10-11 MED ORDER — LIDOCAINE HCL (CARDIAC) PF 100 MG/5ML IV SOSY
PREFILLED_SYRINGE | INTRAVENOUS | Status: DC | PRN
  Administered 2019-10-11: 50 mg via INTRAVENOUS

## 2019-10-11 MED ORDER — POLYETHYLENE GLYCOL 3350 17 G PO PACK
17.0000 g | PACK | Freq: Every day | ORAL | Status: DC | PRN
  Administered 2019-10-14: 17 g via ORAL
  Filled 2019-10-11: qty 1

## 2019-10-11 MED ORDER — DEXAMETHASONE SODIUM PHOSPHATE 4 MG/ML IJ SOLN
INTRAMUSCULAR | Status: AC
  Filled 2019-10-11: qty 1

## 2019-10-11 MED ORDER — SODIUM CHLORIDE 0.9 % IV SOLN
8.0000 mg/h | INTRAVENOUS | Status: DC
  Administered 2019-10-11 – 2019-10-13 (×4): 8 mg/h via INTRAVENOUS
  Filled 2019-10-11 (×4): qty 80

## 2019-10-11 MED ORDER — SODIUM CHLORIDE 0.9 % IV BOLUS
1000.0000 mL | Freq: Once | INTRAVENOUS | Status: AC
  Administered 2019-10-11: 1000 mL via INTRAVENOUS

## 2019-10-11 MED ORDER — ONDANSETRON HCL 4 MG PO TABS: 4.0000 mg | ORAL_TABLET | Freq: Four times a day (QID) | ORAL | Status: DC | PRN

## 2019-10-11 MED ORDER — ONDANSETRON HCL 4 MG/2ML IJ SOLN
4.0000 mg | Freq: Four times a day (QID) | INTRAMUSCULAR | Status: DC | PRN
  Administered 2019-10-12: 4 mg via INTRAVENOUS
  Filled 2019-10-11: qty 2

## 2019-10-11 MED ORDER — SODIUM CHLORIDE 0.9 % IV SOLN
80.0000 mg | Freq: Once | INTRAVENOUS | Status: AC
  Administered 2019-10-11: 80 mg via INTRAVENOUS
  Filled 2019-10-11: qty 80

## 2019-10-11 MED ORDER — PANTOPRAZOLE SODIUM 40 MG IV SOLR: 40.0000 mg | Freq: Two times a day (BID) | INTRAVENOUS | Status: DC

## 2019-10-11 MED ORDER — DEXTROSE-NACL 5-0.9 % IV SOLN: INTRAVENOUS | Status: DC

## 2019-10-11 MED ORDER — ALBUTEROL SULFATE HFA 108 (90 BASE) MCG/ACT IN AERS
INHALATION_SPRAY | RESPIRATORY_TRACT | Status: DC | PRN
  Administered 2019-10-11: 2 via RESPIRATORY_TRACT

## 2019-10-11 MED ORDER — CYANOCOBALAMIN 1000 MCG/ML IJ SOLN
1000.0000 ug | INTRAMUSCULAR | Status: DC
  Administered 2019-10-12 – 2019-10-14 (×3): 1000 ug via INTRAMUSCULAR
  Filled 2019-10-11 (×4): qty 1

## 2019-10-11 MED ORDER — FENTANYL CITRATE (PF) 100 MCG/2ML IJ SOLN
INTRAMUSCULAR | Status: AC
  Filled 2019-10-11: qty 2

## 2019-10-11 MED ORDER — ACETAMINOPHEN 325 MG PO TABS
650.0000 mg | ORAL_TABLET | Freq: Four times a day (QID) | ORAL | Status: DC | PRN
  Administered 2019-10-12 – 2019-10-13 (×2): 650 mg via ORAL
  Filled 2019-10-11 (×3): qty 2

## 2019-10-11 MED ORDER — SODIUM CHLORIDE 0.9 % IV SOLN
10.0000 mL/h | Freq: Once | INTRAVENOUS | Status: AC
  Administered 2019-10-11: 10 mL/h via INTRAVENOUS

## 2019-10-11 MED ORDER — ALBUTEROL SULFATE HFA 108 (90 BASE) MCG/ACT IN AERS
INHALATION_SPRAY | RESPIRATORY_TRACT | Status: AC
  Filled 2019-10-11: qty 6.7

## 2019-10-11 MED ORDER — ACETAMINOPHEN 650 MG RE SUPP: 650.0000 mg | Freq: Four times a day (QID) | RECTAL | Status: DC | PRN

## 2019-10-11 MED ORDER — SODIUM CHLORIDE 0.9 % IV SOLN: INTRAVENOUS | Status: DC | PRN

## 2019-10-11 MED ORDER — ONDANSETRON HCL 4 MG/2ML IJ SOLN
4.0000 mg | Freq: Once | INTRAMUSCULAR | Status: AC
  Administered 2019-10-11: 4 mg via INTRAVENOUS
  Filled 2019-10-11: qty 2

## 2019-10-11 MED ORDER — ONDANSETRON HCL 4 MG/2ML IJ SOLN
INTRAMUSCULAR | Status: AC
  Filled 2019-10-11: qty 2

## 2019-10-11 MED ORDER — PROPOFOL 10 MG/ML IV BOLUS
INTRAVENOUS | Status: DC | PRN
  Administered 2019-10-11: 150 mg via INTRAVENOUS

## 2019-10-11 MED ORDER — ONDANSETRON HCL 4 MG/2ML IJ SOLN
INTRAMUSCULAR | Status: DC | PRN
  Administered 2019-10-11: 4 mg via INTRAVENOUS

## 2019-10-11 MED ORDER — SODIUM CHLORIDE 0.9 % IV SOLN
10.0000 mL/h | Freq: Once | INTRAVENOUS | Status: AC
  Administered 2019-10-11: 07:00:00 10 mL/h via INTRAVENOUS

## 2019-10-11 MED ORDER — SUCCINYLCHOLINE CHLORIDE 20 MG/ML IJ SOLN
INTRAMUSCULAR | Status: DC | PRN
  Administered 2019-10-11: 50 mg via INTRAVENOUS

## 2019-10-11 NOTE — Consult Note (Signed)
Vonda Antigua, MD [address], [place], [place], [place], [address] [address], [place], [place], [place], [address] Phone: [phone]  Fax: [phone]  Consultation  Referring Provider:     Dr. Mal Misty Primary Care Physician:  [facility] Reason for Consultation:     Melena  Date of Admission:  10/11/2019 Date of Consultation:  10/11/2019         HPI:   Jaime Fitzgerald is a 59 y.o. female who started having abdominal pain yesterday night and reported one episode of black emesis this morning and melena as well. Taking goodie powder and alka seltzer for a week.  No prior history of GI bleed.  No prior EGD.  Patient felt dizzy at home as well.  Hemoglobin decreased to 8.6 on this admission.  CTA done in the ER showed abdominal atherosclerosis without active arterial bleeding    Past Medical History:  Diagnosis Date  . Asthma   . Diabetes mellitus without complication ([place])   . Hypertension   . Seizures ([place])     Past Surgical History:  Procedure Laterality Date  . CARPAL TUNNEL RELEASE    . TONSILLECTOMY      Prior to Admission medications   Medication Sig Start Date End Date Taking? Authorizing Provider  cetirizine (ZYRTEC) 10 MG tablet Take 10 mg by mouth daily.   Yes [provider]  cloNIDine (CATAPRES) 0.1 MG tablet Take 0.1 mg by mouth 2 (two) times daily.   Yes [provider]  gabapentin (NEURONTIN) 300 MG capsule Take 300 mg by mouth 3 (three) times daily. 08/14/13  Yes [provider]  glipiZIDE (GLUCOTROL XL) 10 MG 24 hr tablet Take 10 mg by mouth 2 (two) times daily.   Yes [provider]  pravastatin (PRAVACHOL) 40 MG tablet Take 40 mg by mouth daily.   Yes [provider]  topiramate (TOPAMAX) 100 MG tablet Take 200 mg by mouth 2 (two) times daily. 06/14/13  Yes [provider]    History reviewed. No pertinent family history.   Social History   Tobacco Use  .  Smoking status: Never Smoker  . Smokeless tobacco: Never Used  Substance Use Topics  . Alcohol use: Yes  . Drug use: Not on file    Allergies as of 10/11/2019 - Review Complete 10/11/2019  Allergen Reaction Noted  . Aspirin Nausea Only and Other (See Comments) 05/08/2014  . Penicillins Rash 12/24/2015    Review of Systems:    All systems reviewed and negative except where noted in HPI.   Physical Exam:  Vital signs in last 24 hours: Vitals:   10/11/19 0630 10/11/19 0659 10/11/19 0721 10/11/19 0825  BP: 108/61 99/66 (!) 94/53 136/79  Pulse: (!) 107 (!) 107 (!) 107 100  Resp: (!) 30 14 16 14   Temp:  98.1 F (36.7 C) 97.8 F (36.6 C) 98 F (36.7 C)  TempSrc:  Oral Oral Oral  SpO2: 95% 93% 95% 96%  Weight:      Height:         General:   Pleasant, cooperative in NAD Head:  Normocephalic and atraumatic. Eyes:   No icterus.   Conjunctiva pink. PERRLA. Ears:  Normal auditory acuity. Neck:  Supple; no masses or thyroidomegaly Lungs: Respirations even and unlabored. Lungs clear to auscultation bilaterally.   No wheezes, crackles, or rhonchi.  Abdomen:  Soft, nondistended, nontender. Normal bowel sounds. No appreciable masses or hepatomegaly.  No rebound or guarding.  Neurologic:  Alert and oriented x3;  grossly normal neurologically. Skin:  Intact without significant lesions or rashes. Cervical Nodes:  No significant cervical adenopathy. Psych:  Alert and cooperative. Normal affect.  LAB RESULTS: Recent Labs    10/11/19 0623  WBC 16.9*  HGB 8.6*  HCT 26.5*  PLT 210   BMET Recent Labs    10/11/19 0623  NA 140  K 3.6  CL 102  CO2 28  GLUCOSE 140*  BUN 50*  CREATININE 1.45*  CALCIUM 8.5*   LFT Recent Labs    10/11/19 0623  PROT 5.8*  ALBUMIN 3.3*  AST 28  ALT 14  ALKPHOS 45  BILITOT 0.5   PT/INR Recent Labs    10/11/19 0623  LABPROT 14.0  INR 1.1    STUDIES: CT Angio Abd/Pel w/ and/or w/o  Result Date: 10/11/2019 CLINICAL DATA:  Abdominal  pain, GI bleed EXAM: CT ANGIOGRAPHY ABDOMEN AND PELVIS WITH CONTRAST AND WITHOUT CONTRAST TECHNIQUE: Multidetector CT imaging of the abdomen and pelvis was performed using the standard protocol during bolus administration of intravenous contrast. Multiplanar reconstructed images and MIPs were obtained and reviewed to evaluate the vascular anatomy. CONTRAST:  22mL OMNIPAQUE IOHEXOL 350 MG/ML SOLN COMPARISON:  11/04/2016 FINDINGS: VASCULAR Aorta: minor aortic atherosclerosis without aneurysm, dissection, occlusive process, evidence of rupture, or retroperitoneal hematoma. Celiac: Widely patent origin including its branches. No active arterial bleeding in the celiac distribution. SMA: Widely patent origin including its branches. No active arterial bleeding in the SMA distribution. Renals: Atherosclerotic origins with heavy calcification at the left renal ostium. Difficult to exclude left renal artery stenosis. Right renal artery appears patent. No accessory renal artery. IMA: Patent origin off the distal aorta. Distal branches are not opacified. No active arterial bleeding in the IMA territory appreciated. Inflow: Common, internal and external iliac arteries all remain patent. Minor atherosclerosis and tortuosity without occlusive process or inflow disease. Proximal Outflow: Visualized common femoral, proximal profunda femoral, proximal superficial femoral arteries are all patent. Veins: No veno-occlusive process. Review of the MIP images confirms the above findings. NON-VASCULAR Lower chest: Dependent bibasilar atelectasis. Chronic inferior lingula and right middle lobe scarring. No acute lower chest process. Native coronary atherosclerosis noted. Normal heart size. No pericardial or pleural effusion. Hepatobiliary: Focal fatty infiltration of the liver along the falciform ligament anteriorly in the left hepatic lobe, image 23 series 9. No other hepatic abnormality or intrahepatic biliary dilatation. Hepatic and  portal veins are patent. Gallbladder and biliary system unremarkable. Common bile duct nondilated. Pancreas: Unremarkable. No pancreatic ductal dilatation or surrounding inflammatory changes. Spleen: Normal in size without focal abnormality. Adrenals/Urinary Tract: Normal adrenal glands. Kidneys demonstrate no acute process or hydronephrosis. No focal abnormality. Ureters are symmetric and decompressed. No obstructing ureteral calculus. No UVJ abnormality. Bladder unremarkable. Stomach/Bowel: Negative for bowel obstruction, significant dilatation, ileus, or free air. Normal appendix. Colonic diverticulosis most pronounced in the sigmoid without acute inflammatory process. No fluid collection, hemorrhage, hematoma, abscess, ascites or adenopathy. Lymphatic: No bulky adenopathy. Reproductive: Status post hysterectomy. No adnexal masses. Other: No abdominal wall hernia or abnormality. No abdominopelvic ascites. Musculoskeletal: Degenerative changes noted of the spine. Lower lumbar facet arthropathy. No acute osseous finding. IMPRESSION: VASCULAR Abdominal atherosclerosis as detailed above without active arterial bleeding or other acute vascular finding. NON-VASCULAR No other acute intra-abdominal or pelvic finding by CTA. Colonic diverticulosis without acute inflammatory process Electronically Signed   By: Jerilynn Mages.  Shick M.D.   On: 10/11/2019 09:48      Impression / Plan:  Jaime Fitzgerald is a 59 y.o. y/o female with reported melena and hematemesis at home, with acute anemia with NSAID use  EGD indicated for further evaluation of upper GI bleed from possible peptic ulcer disease, versus esophagitis versus AVMs, versus dieualfoy lesion  PPI IV twice daily  Continue serial CBCs and transfuse PRN Avoid NSAIDs Maintain 2 large-bore IV lines Please page GI with any acute hemodynamic changes, or signs of active GI bleeding  I have discussed alternative options, risks & benefits,  which include, but are not limited  to, bleeding, infection, perforation,respiratory complication & drug reaction.  The patient agrees with this plan & written consent will be obtained.     Thank you for involving me in the care of this patient.      LOS: 0 days   Virgel Manifold, MD  10/11/2019, 10:00 AM

## 2019-10-11 NOTE — Anesthesia Postprocedure Evaluation (Signed)
Anesthesia Post Note  Patient: Jaime Fitzgerald  Procedure(s) Performed: ESOPHAGOGASTRODUODENOSCOPY (EGD) WITH PROPOFOL (N/A )  Patient location during evaluation: PACU Anesthesia Type: General Level of consciousness: awake and alert Pain management: pain level controlled Vital Signs Assessment: post-procedure vital signs reviewed and stable Respiratory status: spontaneous breathing, nonlabored ventilation, respiratory function stable and patient connected to nasal cannula oxygen Cardiovascular status: blood pressure returned to baseline and stable Postop Assessment: no apparent nausea or vomiting Anesthetic complications: no     Last Vitals:  Vitals:   10/11/19 1418 10/11/19 1428  BP: (!) 134/58 (!) 161/79  Pulse: (!) 110 96  Resp: (!) 32 (!) 22  Temp:    SpO2: 97% 100%    Last Pain:  Vitals:   10/11/19 1428  TempSrc:   PainSc: 0-No pain                 Arita Miss

## 2019-10-11 NOTE — ED Provider Notes (Signed)
[facility] Emergency Department Provider Note  ____________________________________________  Time seen: Approximately 6:22 AM  I have reviewed the triage vital signs and the nursing notes.   HISTORY  Chief Complaint Abdominal Pain   HPI Jaime Fitzgerald is a 59 y.o. female with a history of asthma, diabetes, hypertension, seizure who presents via EMS for abdominal pain from home.  Patient reports that her symptoms started yesterday.  She has had diffuse cramping severe abdominal pain associated with 3 episodes of coffee-ground emesis and 3 episodes of melena.  She denies any prior history of GI bleed.  She is not on blood thinners.  She denies history of alcohol abuse or cirrhosis of the liver.  She denies chest pain or shortness of breath.   Past Medical History:  Diagnosis Date  . Asthma   . Diabetes mellitus without complication ([place])   . Hypertension   . Seizures ([place])     There are no problems to display for this patient.   Past Surgical History:  Procedure Laterality Date  . CARPAL TUNNEL RELEASE    . TONSILLECTOMY      Prior to Admission medications   Medication Sig Start Date End Date Taking? Authorizing Provider  albuterol (PROVENTIL HFA;VENTOLIN HFA) 108 (90 Base) MCG/ACT inhaler Inhale 2 puffs into the lungs every 6 (six) hours as needed for wheezing or shortness of breath. 01/07/17   Rudene Re, MD  azithromycin (ZITHROMAX Z-PAK) 250 MG tablet Take 2 tablets (500 mg) on  Day 1,  followed by 1 tablet (250 mg) once daily on Days 2 through 5. 12/24/15   Hagler, Jami L, PA-C  chlorpheniramine-HYDROcodone (TUSSIONEX PENNKINETIC ER) 10-8 MG/5ML SUER Take 5 mLs by mouth every 12 (twelve) hours as needed for cough. 12/24/15   Hagler, Jami L, PA-C  meclizine (ANTIVERT) 25 MG tablet Take 1 tablet (25 mg total) by mouth 3 (three) times daily as needed for dizziness. 11/03/16   Hinda Kehr, MD  oseltamivir (TAMIFLU) 75 MG capsule Take 1 capsule  (75 mg total) by mouth 2 (two) times daily. 12/24/15   Hagler, Jami L, PA-C    Allergies Penicillins  History reviewed. No pertinent family history.  Social History Social History   Tobacco Use  . Smoking status: Never Smoker  . Smokeless tobacco: Never Used  Substance Use Topics  . Alcohol use: Yes  . Drug use: Not on file    Review of Systems  Constitutional: Negative for fever. Eyes: Negative for visual changes. ENT: Negative for sore throat. Neck: No neck pain  Cardiovascular: Negative for chest pain. Respiratory: Negative for shortness of breath. Gastrointestinal: + abdominal pain, vomiting and diarrhea. Genitourinary: Negative for dysuria. Musculoskeletal: Negative for back pain. Skin: Negative for rash. Neurological: Negative for headaches, weakness or numbness. Psych: No SI or HI  ____________________________________________   PHYSICAL EXAM:  VITAL SIGNS: ED Triage Vitals  Enc Vitals Group     BP 10/11/19 0616 (!) 111/54     Pulse Rate 10/11/19 0616 (!) 110     Resp 10/11/19 0616 20     Temp 10/11/19 0611 97.7 F (36.5 C)     Temp Source 10/11/19 0611 Oral     SpO2 10/11/19 0616 99 %     Weight 10/11/19 0613 179 lb 14.3 oz (81.6 kg)     Height 10/11/19 0613 5\' 3"  (1.6 m)     Head Circumference --      Peak Flow --      Pain Score  10/11/19 0612 9     Pain Loc --      Pain Edu? --      Excl. in [place]? --     Constitutional: Alert and oriented, looks uncomfortable due to the pain.  HEENT:      Head: Normocephalic and atraumatic.         Eyes: Conjunctivae are normal. Sclera is non-icteric.       Mouth/Throat: Mucous membranes are moist.       Neck: Supple with no signs of meningismus. Cardiovascular: Tachycardic with regular rhythm. Respiratory: Normal respiratory effort. Lungs are clear to auscultation bilaterally. No wheezes, crackles, or rhonchi.  Gastrointestinal: Distended with diffuse tenderness and positive bowel sounds . No rebound or  guarding. Genitourinary: Rectal exam showing melena grossly guaiac positive Musculoskeletal: Nontender with normal range of motion in all extremities. No edema, cyanosis, or erythema of extremities. Neurologic: Normal speech and language. Face is symmetric. Moving all extremities. No gross focal neurologic deficits are appreciated. Skin: Skin is warm, dry and intact. No rash noted. Psychiatric: Mood and affect are normal. Speech and behavior are normal.  ____________________________________________   LABS (all labs ordered are listed, but only abnormal results are displayed)  Labs Reviewed  CBC WITH DIFFERENTIAL/PLATELET - Abnormal; Notable for the following components:      Result Value   WBC 16.9 (*)    RBC 2.52 (*)    Hemoglobin 8.6 (*)    HCT 26.5 (*)    MCV 105.2 (*)    MCH 34.1 (*)    Neutro Abs 13.0 (*)    Abs Immature Granulocytes 0.34 (*)    All other components within normal limits  COMPREHENSIVE METABOLIC PANEL - Abnormal; Notable for the following components:   Glucose, Bld 140 (*)    BUN 50 (*)    Creatinine, Ser 1.45 (*)    Calcium 8.5 (*)    Total Protein 5.8 (*)    Albumin 3.3 (*)    GFR calc non Af Amer 39 (*)    GFR calc Af Amer 45 (*)    All other components within normal limits  LACTIC ACID, PLASMA - Abnormal; Notable for the following components:   Lactic Acid, Venous 3.4 (*)    All other components within normal limits  RESPIRATORY PANEL BY RT PCR (FLU A&B, COVID)  LIPASE, BLOOD  PROTIME-INR  TYPE AND SCREEN  PREPARE RBC (CROSSMATCH)  ABO/RH   ____________________________________________  EKG  ED ECG REPORT I, Rudene Re, the attending physician, personally viewed and interpreted this ECG.  Sinus tachycardia, rate of 111, normal intervals, normal axis, no ST elevations or depressions. ____________________________________________  RADIOLOGY  CT a/p: PND ____________________________________________   PROCEDURES  Procedure(s)  performed: None Procedures Critical Care performed: yes  CRITICAL CARE Performed by: Rudene Re  ?  Total critical care time: 35 min  Critical care time was exclusive of separately billable procedures and treating other patients.  Critical care was necessary to treat or prevent imminent or life-threatening deterioration.  Critical care was time spent personally by me on the following activities: development of treatment plan with patient and/or surrogate as well as nursing, discussions with consultants, evaluation of patient's response to treatment, examination of patient, obtaining history from patient or surrogate, ordering and performing treatments and interventions, ordering and review of laboratory studies, ordering and review of radiographic studies, pulse oximetry and re-evaluation of patient's condition.  ____________________________________________   INITIAL IMPRESSION / ASSESSMENT AND PLAN / ED COURSE   59 y.o.  female with a history of asthma, diabetes, hypertension, seizure who presents via EMS for abdominal pain, distention, melena, and coffee-ground emesis.  Patient is tachycardic with a pulse of 110 and blood pressure 111/54.  Rectal exam showing black tarry stool grossly guaiac positive consistent with melena.  Differential diagnoses including peptic ulcer disease versus variceal bleed versus AVM.  We will check labs, type and screen, coags.  Will get CT of the abdomen and pelvis.  Will treat symptoms with Zofran, morphine, and Protonix bolus and drip.  Patient denies history of cirrhosis of the liver or history of alcohol abuse therefore will hold off octreotide and Rocephin at this time.  Anticipate admission.  _________________________ 6:55 AM on 10/11/2019 ----------------------------------------- Patient with new anemia, hemoglobin of 8.6 (patient's hemoglobin was 14.5 in July 2020).  Therefore with active bleeding will give 1 unit of emergent release  blood.  Will type and cross 2 more.  Labs showing mild AKI.  Normal INR, normal platelets.    Clinical Course as of Oct 10 726  Wed Oct 11, 2019  0727 I discussed with Dr. Bonna Gains patient's presentation, soft blood pressure, tachycardia, and acute blood loss anemia and she agrees that patient will need a more emergent endoscopy.  She will plan on doing today.  In the meantime she agrees with the plan of Protonix and blood.  Patient will be admitted now to the hospitalist service.   [CV]    Clinical Course User Index [CV] Alfred Levins [place], MD      As part of my medical decision making, I reviewed the following data within the [place] notes reviewed and incorporated, Labs reviewed , EKG interpreted , Old EKG reviewed, Old chart reviewed, Discussed with admitting physician , A consult was requested and obtained from this/these consultant(s) GI, Notes from prior ED visits and [place] Controlled Substance Database   Please note:  Patient was evaluated in Emergency Department today for the symptoms described in the history of present illness. Patient was evaluated in the context of the global COVID-19 pandemic, which necessitated consideration that the patient might be at risk for infection with the SARS-CoV-2 virus that causes COVID-19. Institutional protocols and algorithms that pertain to the evaluation of patients at risk for COVID-19 are in a state of rapid change based on information released by regulatory bodies including the CDC and federal and state organizations. These policies and algorithms were followed during the patient's care in the ED.  Some ED evaluations and interventions may be delayed as a result of limited staffing during the pandemic.   ____________________________________________   FINAL CLINICAL IMPRESSION(S) / ED DIAGNOSES   Final diagnoses:  Gastrointestinal hemorrhage, unspecified gastrointestinal hemorrhage type  AKI (acute kidney injury)  ([place])  Acute blood loss anemia      NEW MEDICATIONS STARTED DURING THIS VISIT:  ED Discharge Orders    None       Note:  This document was prepared using Dragon voice recognition software and may include unintentional dictation errors.    Alfred Levins, [place], MD 10/11/19 [phone]

## 2019-10-11 NOTE — ED Notes (Signed)
Pt changed into gown and all extra linens removed. Pt resting comfortably at this time.

## 2019-10-11 NOTE — Progress Notes (Signed)
Pt arrived from PACU via stretcher. Bedside report given by RN. No distress noted. Pt oriented to unit/room. Call bell within reach. RN asked Dr Mal Misty for orders for medication with sips of water but said she cant. Pt is NPO.

## 2019-10-11 NOTE — Op Note (Addendum)
[facility] Patient Name: Jaime Fitzgerald Procedure Date: 10/11/2019 12:30 PM MRN: [id number] Account #: [id number] Date of Birth: 1960-12-04 Admit Type: Outpatient Age: 59 Room: [facility] ENDO ROOM 4 Gender: Female Note Status: Finalized Procedure:             Upper GI endoscopy Indications:           Hematemesis Providers:             Kyllian Clingerman B. Bonna Gains MD, MD Medicines:             Monitored Anesthesia Care Complications:         No immediate complications. Procedure:             Pre-Anesthesia Assessment:                        - The risks and benefits of the procedure and the                         sedation options and risks were discussed with the                         patient. All questions were answered and informed                         consent was obtained.                        - Patient identification and proposed procedure were                         verified prior to the procedure.                        - ASA Grade Assessment: III - A patient with severe                         systemic disease.                        After obtaining informed consent, the endoscope was                         passed under direct vision. Throughout the procedure,                         the patient's blood pressure, pulse, and oxygen                         saturations were monitored continuously. The Endoscope                         was introduced through the mouth, and advanced to the                         second part of duodenum. The upper GI endoscopy was                         accomplished with ease. The patient tolerated the  procedure well. Findings:      The examined esophagus was normal.      Red blood was found in the entire examined stomach. The scope was       withdrawn and replaced with the therapeutic endoscope because of       bleeding and in order to enhance visibility. The scope had to be   re-exchanged due to clogging of the suction channel with blood clots and       food material in the fundus and was changed to a regular adult EGD scope       as there was only 1 therapeutic scope available.      Two oozing cratered gastric ulcers were found in the gastric fundus and       in the gastric body. The largest lesion was 10 mm in largest dimension.       Coagulation for hemostasis using argon plasma was successful. Only one       of the two ulcers were seen to be bleeding and was treated with cautery.       Initially the cautery lead to resolution of bleeding. However, when it       was further washed with water jet it started bleeding again and was       retreated with APC with good results and no further bleeding.      Red blood was found in the duodenal bulb. Lavage of the area was       performed using normal saline, resulting in clearance with good       visualization.      The second portion of the duodenum was normal.      Blood clots and food material was seen in the fundus that was partially       cleared but could not be completely cleared due to clogging of the       suction channel. On initial presentation of the scope into the stomach       blood was seen covering the gastric and duodenal tissue. Upon washing       the tissue as much as possible with water no other sources of bleeding       were seen besides the ulcers reported above. No further active blood or       bleeding was noted to be present at the end of the procedure. Impression:            - Normal esophagus.                        - Red blood in the entire stomach.                        - Oozing gastric ulcers. Treated with argon plasma                         coagulation (APC).                        - Blood in the duodenal bulb.                        - Normal second portion of the duodenum.                        - No specimens collected.  Recommendation:        - NPO today, then advance as tolerated  to clear liquid                         diet tomorrow if no further active bleeding.                        - Use Protonix (pantoprazole) 40 mg IV BID.                        - Repeat upper endoscopy on this admission if                         rebleeding occurs.                        - Repeat upper endoscopy in 4 weeks to visualize                         mucosa and obtain biopsies after bleeding has resolved                         in 4-6 weeks.                        - Use Protonix (pantoprazole) 40 mg IV BID.                        - Continue Serial CBCs and transfuse PRN                        - Avoid NSAIDs except Aspirin if medically indicated                         by PCP                        - The findings and recommendations were discussed with                         the patient.                        - Return to GI clinic in 4 weeks.                        - Return to primary care physician in 2 weeks. Procedure Code(s):     --- Professional ---                        [phone], Esophagogastroduodenoscopy, flexible,                         transoral; with control of bleeding, any method Diagnosis Code(s):     --- Professional ---                        K92.2, Gastrointestinal hemorrhage, unspecified                        K25.4, Chronic or unspecified gastric ulcer with  hemorrhage                        K92.0, Hematemesis CPT copyright 2019 American Medical Association. All rights reserved. The codes documented in this report are preliminary and upon coder review may  be revised to meet current compliance requirements.  Vonda Antigua, MD Margretta Sidle B. Bonna Gains MD, MD 10/11/2019 2:13:40 PM This report has been signed electronically. Number of Addenda: 0 Note Initiated On: 10/11/2019 12:30 PM Estimated Blood Loss:  Estimated blood loss: none.      [facility]

## 2019-10-11 NOTE — Transfer of Care (Signed)
Immediate Anesthesia Transfer of Care Note  Patient: Jaime Fitzgerald  Procedure(s) Performed: ESOPHAGOGASTRODUODENOSCOPY (EGD) WITH PROPOFOL (N/A )  Patient Location: PACU  Anesthesia Type:General  Level of Consciousness: drowsy  Airway & Oxygen Therapy: Patient Spontanous Breathing and Patient connected to face mask oxygen  Post-op Assessment: Report given to RN and Post -op Vital signs reviewed and stable  Post vital signs: Reviewed and stable  Last Vitals:  Vitals Value Taken Time  BP 147/79 10/11/19 1409  Temp 36.4 C 10/11/19 1409  Pulse 104 10/11/19 1409  Resp 29 10/11/19 1409  SpO2 100 % 10/11/19 1409    Last Pain:  Vitals:   10/11/19 0825  TempSrc: Oral  PainSc:          Complications: No apparent anesthesia complications

## 2019-10-11 NOTE — ED Notes (Signed)
MD at bedside and pt now reporting abd symptoms started today.

## 2019-10-11 NOTE — Anesthesia Procedure Notes (Signed)
Procedure Name: Intubation Date/Time: 10/11/2019 12:55 PM Performed by: Johnna Acosta, CRNA Pre-anesthesia Checklist: Patient identified, Emergency Drugs available, Suction available, Patient being monitored and Timeout performed Patient Re-evaluated:Patient Re-evaluated prior to induction Oxygen Delivery Method: Circle system utilized Preoxygenation: Pre-oxygenation with 100% oxygen Induction Type: IV induction, Rapid sequence and Cricoid Pressure applied Laryngoscope Size: McGraph and 3 Grade View: Grade I Tube type: Oral Tube size: 7.0 mm Number of attempts: 1 Airway Equipment and Method: Stylet Placement Confirmation: ETT inserted through vocal cords under direct vision,  positive ETCO2 and breath sounds checked- equal and bilateral Secured at: 21 cm Tube secured with: Tape Dental Injury: Teeth and Oropharynx as per pre-operative assessment  Difficulty Due To: Difficulty was anticipated and Difficult Airway- due to limited oral opening

## 2019-10-11 NOTE — Anesthesia Preprocedure Evaluation (Signed)
Anesthesia Evaluation  Patient identified by MRN, date of birth, ID band Patient awake  General Assessment Comment:Patient in visible pain  Reviewed: Allergy & Precautions, NPO status , Patient's Chart, lab work & pertinent test results  History of Anesthesia Complications Negative for: history of anesthetic complications  Airway Mallampati: III  TM Distance: >3 FB Neck ROM: Full    Dental  (+) Teeth Intact, Partial Upper   Pulmonary asthma , neg sleep apnea, neg COPD, Patient abstained from smoking.Not current smoker,  Hospitalized a few times for asthma, last time in past few years   Pulmonary exam normal breath sounds clear to auscultation       Cardiovascular Exercise Tolerance: Good METShypertension, Pt. on medications (-) CAD and (-) Past MI negative cardio ROS   Rhythm:Regular Rate:Normal - Systolic murmurs    Neuro/Psych negative neurological ROS  negative psych ROS   GI/Hepatic Neg liver ROS, neg GERD  ,  Endo/Other  diabetes, Type 2  Renal/GU Renal InsufficiencyRenal disease     Musculoskeletal   Abdominal (+) + obese,   Peds  Hematology   Anesthesia Other Findings   Reproductive/Obstetrics                             Anesthesia Physical Anesthesia Plan  ASA: III  Anesthesia Plan: General   Post-op Pain Management:    Induction: Intravenous  PONV Risk Score and Plan: 3 and Ondansetron, Dexamethasone and Treatment may vary due to age or medical condition  Airway Management Planned: Oral ETT  Additional Equipment: None  Intra-op Plan:   Post-operative Plan: Extubation in OR  Informed Consent: I have reviewed the patients History and Physical, chart, labs and discussed the procedure including the risks, benefits and alternatives for the proposed anesthesia with the patient or authorized representative who has indicated his/her understanding and acceptance.      Dental advisory given  Plan Discussed with: CRNA and Surgeon  Anesthesia Plan Comments: (Discussed risks of anesthesia with patient, including PONV, aspiration, sore throat, lip/dental damage. Rare risks discussed as well, such as cardiorespiratory sequelae. Patient understands.)        Anesthesia Quick Evaluation

## 2019-10-11 NOTE — ED Notes (Signed)
[place] paper with sticky note that states to place in patient's chart put in pt's cubby at this time.

## 2019-10-11 NOTE — ED Notes (Addendum)
Bedside hemoccult positive for blood in stool.   Pt reporting vomit is also black in color. Pt denies hx of drinking alcohol.

## 2019-10-11 NOTE — H&P (Addendum)
History and Physical:    [name]   [id number] DOB: Jun 30, 1961 DOA: 10/11/2019  Referring MD/provider: Rudene Re, MD PCP: [facility]   Patient coming from: Home  Chief Complaint: Vomiting blood and passing black stools  History of Present Illness:   Jaime Fitzgerald is an 59 y.o. 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  She said she severe abdominal pain last night and she took Alka-Seltzer to relieve her pain.  She said she is allergic to aspirin ("I break out and I pass out) and patient did not know that Alka-Seltzer contains aspirin until I told her in the emergency room.  This morning, she vomited blood and also passed black stools.  She passed out in her kitchen and fell on the floor.  She also said she was a little confused.  It is not clear whether her report and timing of events is accurate.  She says she has never had any rectal bleeding or vomiting blood in the past.  She does not drink alcohol and she has no history of peptic ulcer or liver disease. She felt dizzy and short of breath.  She also has a cough that is productive of yellowish sputum.  She said she has asthma and she thinks her asthma is acting up.  She had mild wheezing.  She has no other complaints.   ED Course:  The patient was tachycardic, hypotensive in the emergency room.  She had elevated creatinine of 1.45, elevated BUN of 50, elevated WBC of 16.9, elevated lactic acid of 3.4 and decreased hemoglobin of 8.6.  She was given a bolus of IV Protonix and started on IV Protonix infusion in the emergency room.  She was also given 1 L of normal saline and transfusion of 1 unit of packed red blood cells was initiated in the emergency room.  She was also given IV morphine for pain and IV Zofran for nausea.   Gastroenterologist was notified by ED physician.  ROS:   ROS all other systems reviewed were negative  Past Medical History:   Past Medical History:  Diagnosis Date  . Asthma   . Diabetes mellitus without complication ([place])   . Hypertension   . Seizures ([place])     Past Surgical History:   Past Surgical History:  Procedure Laterality Date  . CARPAL TUNNEL RELEASE    . TONSILLECTOMY      Social History:   Social History   Socioeconomic History  . Marital status: Single    Spouse name: Not on file  . Number of children: Not on file  . Years of education: Not on file  . Highest education level: Not on file  Occupational History  . Not on file  Tobacco Use  . Smoking status: Never Smoker  . Smokeless tobacco: Never Used  Substance and Sexual Activity  . Alcohol use: Yes  . Drug use: Not on file  . Sexual activity: Not on file  Other Topics Concern  . Not on file  Social History Narrative  . Not on file   Social Determinants of Health   Financial Resource Strain:   . Difficulty of Paying Living Expenses: Not on file  Food Insecurity:   . Worried About Charity fundraiser in the Last Year: Not on file  . Ran Out of Food in the  Last Year: Not on file  Transportation Needs:   . Lack of Transportation (Medical): Not on file  . Lack of Transportation (Non-Medical): Not on file  Physical Activity:   . Days of Exercise per Week: Not on file  . Minutes of Exercise per Session: Not on file  Stress:   . Feeling of Stress : Not on file  Social Connections:   . Frequency of Communication with Friends and Family: Not on file  . Frequency of Social Gatherings with Friends and Family: Not on file  . Attends Religious Services: Not on file  . Active Member of Clubs or Organizations: Not on file  . Attends Archivist Meetings: Not on file  . Marital Status: Not on file  Intimate Partner Violence:   . Fear of Current or Ex-Partner: Not on file  . Emotionally  Abused: Not on file  . Physically Abused: Not on file  . Sexually Abused: Not on file    Allergies   Aspirin and Penicillins  Family history:   History reviewed. No pertinent family history.  Current Medications:   Prior to Admission medications   Medication Sig Start Date End Date Taking? Authorizing Provider  cetirizine (ZYRTEC) 10 MG tablet Take 10 mg by mouth daily.   Yes [provider]  glipiZIDE (GLUCOTROL XL) 10 MG 24 hr tablet Take 10 mg by mouth 2 (two) times daily.   Yes [provider]  albuterol (PROVENTIL HFA;VENTOLIN HFA) 108 (90 Base) MCG/ACT inhaler Inhale 2 puffs into the lungs every 6 (six) hours as needed for wheezing or shortness of breath. Patient not taking: Reported on 10/11/2019 01/07/17   Rudene Re, MD  azithromycin (ZITHROMAX Z-PAK) 250 MG tablet Take 2 tablets (500 mg) on  Day 1,  followed by 1 tablet (250 mg) once daily on Days 2 through 5. Patient not taking: Reported on 10/11/2019 12/24/15   Hagler, Jami L, PA-C  chlorpheniramine-HYDROcodone (TUSSIONEX PENNKINETIC ER) 10-8 MG/5ML SUER Take 5 mLs by mouth every 12 (twelve) hours as needed for cough. Patient not taking: Reported on 10/11/2019 12/24/15   Hagler, Jami L, PA-C  meclizine (ANTIVERT) 25 MG tablet Take 1 tablet (25 mg total) by mouth 3 (three) times daily as needed for dizziness. Patient not taking: Reported on 10/11/2019 11/03/16   Hinda Kehr, MD  oseltamivir (TAMIFLU) 75 MG capsule Take 1 capsule (75 mg total) by mouth 2 (two) times daily. Patient not taking: Reported on 10/11/2019 12/24/15   [address]    Physical Exam:   Vitals:   10/11/19 0630 10/11/19 0659 10/11/19 0721 10/11/19 0825  BP: 108/61 99/66 (!) 94/53 136/79  Pulse: (!) 107 (!) 107 (!) 107 100  Resp: (!) 30 14 16 14   Temp:  98.1 F (36.7 C) 97.8 F (36.6 C) 98 F (36.7 C)  TempSrc:  Oral Oral Oral  SpO2: 95% 93% 95% 96%  Weight:      Height:         Physical Exam: Blood pressure 136/79,  pulse 100, temperature 98 F (36.7 C), temperature source Oral, resp. rate 14, height 5\' 3"  (1.6 m), weight 81.6 kg, SpO2 96 %. Gen: No acute distress. Head: Normocephalic, atraumatic. Eyes: Pupils equal, round and reactive to light. Extraocular movements intact.  Sclerae nonicteric. No lid lag. Mouth: Dry mucous membranes.  No lesions seen in the mouth. Neck: Supple, no thyromegaly, no lymphadenopathy, no jugular venous distention. Chest: Lungs are clear to auscultation with good air movement. No rales,  rhonchi or wheezes. CV: Heart sounds are regular with an S1, S2. No murmurs, rubs, or gallops. Abdomen: Soft, tenderness in the epigastric, periumbilical and suprapubic regions but no rebound tenderness or guarding, obese with normal active bowel sounds. No hepatosplenomegaly or palpable masses. Extremities: Extremities are without clubbing, or cyanosis. No edema. Pedal pulses 2+.  Skin: Warm and dry. No rashes, lesions or wounds. Neuro: Alert and oriented times 3; grossly nonfocal.  Psych: Insight is good and judgment is appropriate. Mood and affect normal.   Data Review:    Labs: Basic Metabolic Panel: Recent Labs  Lab 10/11/19 0623  NA 140  K 3.6  CL 102  CO2 28  GLUCOSE 140*  BUN 50*  CREATININE 1.45*  CALCIUM 8.5*   Liver Function Tests: Recent Labs  Lab 10/11/19 0623  AST 28  ALT 14  ALKPHOS 45  BILITOT 0.5  PROT 5.8*  ALBUMIN 3.3*   Recent Labs  Lab 10/11/19 0623  LIPASE 31   No results for input(s): AMMONIA in the last 168 hours. CBC: Recent Labs  Lab 10/11/19 0623  WBC 16.9*  NEUTROABS 13.0*  HGB 8.6*  HCT 26.5*  MCV 105.2*  PLT 210   Cardiac Enzymes: No results for input(s): CKTOTAL, CKMB, CKMBINDEX, TROPONINI in the last 168 hours.  BNP (last 3 results) No results for input(s): PROBNP in the last 8760 hours. CBG: No results for input(s): GLUCAP in the last 168 hours.  Urinalysis    Component Value Date/Time   COLORURINE YELLOW  10/26/2010 1514   APPEARANCEUR CLOUDY (A) 10/26/2010 1514   LABSPEC 1.020 10/26/2010 1514   PHURINE 5.5 10/26/2010 1514   HGBUR SMALL (A) 10/26/2010 1514   BILIRUBINUR NEGATIVE 10/26/2010 1514   KETONESUR NEGATIVE 10/26/2010 1514   PROTEINUR NEGATIVE 10/26/2010 1514   UROBILINOGEN 0.2 10/26/2010 1514   NITRITE NEGATIVE 10/26/2010 1514   LEUKOCYTESUR NEGATIVE 10/26/2010 1514      Radiographic Studies: CT Angio Abd/Pel w/ and/or w/o  Result Date: 10/11/2019 CLINICAL DATA:  Abdominal pain, GI bleed EXAM: CT ANGIOGRAPHY ABDOMEN AND PELVIS WITH CONTRAST AND WITHOUT CONTRAST TECHNIQUE: Multidetector CT imaging of the abdomen and pelvis was performed using the standard protocol during bolus administration of intravenous contrast. Multiplanar reconstructed images and MIPs were obtained and reviewed to evaluate the vascular anatomy. CONTRAST:  15mL OMNIPAQUE IOHEXOL 350 MG/ML SOLN COMPARISON:  11/04/2016 FINDINGS: VASCULAR Aorta: minor aortic atherosclerosis without aneurysm, dissection, occlusive process, evidence of rupture, or retroperitoneal hematoma. Celiac: Widely patent origin including its branches. No active arterial bleeding in the celiac distribution. SMA: Widely patent origin including its branches. No active arterial bleeding in the SMA distribution. Renals: Atherosclerotic origins with heavy calcification at the left renal ostium. Difficult to exclude left renal artery stenosis. Right renal artery appears patent. No accessory renal artery. IMA: Patent origin off the distal aorta. Distal branches are not opacified. No active arterial bleeding in the IMA territory appreciated. Inflow: Common, internal and external iliac arteries all remain patent. Minor atherosclerosis and tortuosity without occlusive process or inflow disease. Proximal Outflow: Visualized common femoral, proximal profunda femoral, proximal superficial femoral arteries are all patent. Veins: No veno-occlusive process. Review of  the MIP images confirms the above findings. NON-VASCULAR Lower chest: Dependent bibasilar atelectasis. Chronic inferior lingula and right middle lobe scarring. No acute lower chest process. Native coronary atherosclerosis noted. Normal heart size. No pericardial or pleural effusion. Hepatobiliary: Focal fatty infiltration of the liver along the falciform ligament anteriorly in the left hepatic lobe, image 23  series 9. No other hepatic abnormality or intrahepatic biliary dilatation. Hepatic and portal veins are patent. Gallbladder and biliary system unremarkable. Common bile duct nondilated. Pancreas: Unremarkable. No pancreatic ductal dilatation or surrounding inflammatory changes. Spleen: Normal in size without focal abnormality. Adrenals/Urinary Tract: Normal adrenal glands. Kidneys demonstrate no acute process or hydronephrosis. No focal abnormality. Ureters are symmetric and decompressed. No obstructing ureteral calculus. No UVJ abnormality. Bladder unremarkable. Stomach/Bowel: Negative for bowel obstruction, significant dilatation, ileus, or free air. Normal appendix. Colonic diverticulosis most pronounced in the sigmoid without acute inflammatory process. No fluid collection, hemorrhage, hematoma, abscess, ascites or adenopathy. Lymphatic: No bulky adenopathy. Reproductive: Status post hysterectomy. No adnexal masses. Other: No abdominal wall hernia or abnormality. No abdominopelvic ascites. Musculoskeletal: Degenerative changes noted of the spine. Lower lumbar facet arthropathy. No acute osseous finding. IMPRESSION: VASCULAR Abdominal atherosclerosis as detailed above without active arterial bleeding or other acute vascular finding. NON-VASCULAR No other acute intra-abdominal or pelvic finding by CTA. Colonic diverticulosis without acute inflammatory process Electronically Signed   By: Jerilynn Mages.  Shick M.D.   On: 10/11/2019 09:48    EKG: Independently reviewed.  Sinus tachycardia, no acute ST-T  changes   Assessment/Plan:   Principal Problem:   Acute GI bleeding Active Problems:   Hypotension   AKI (acute kidney injury) ([place])   Syncope and collapse   Type 2 diabetes mellitus ([facility])   Acute blood loss anemia   Body mass index is 31.87 kg/m.   Acute GI bleeding/abdominal pain: Admit to telemetry.  Treat with IV fluids and IV Protonix.  CTA abd/pelvis did not show any acute abnormality. There is evidence of fatty liver. Gastroenterologist has been consulted for further evaluation.  Acute blood loss anemia/macrocytosis: Hemoglobin was 14.5 on April 18, 2019.  Hemoglobin is down to 8.6. 1 unit of packed red blood cells was noted in the emergency room and transfusion was ongoing at the time I saw the patient.  Check iron studies and vitamin B12 level.  Monitor H&H.  Hypotension: Treat with IV fluids. Hold antihypertensives  S/p syncope: Likely from GI bleeding/hypotension  Acute kidney injury: This is likely prerenal from GI bleeding.  Leukocytosis: This is likely reactive.  Monitor CBC  Mild acute asthma exacerbation: Treat with bronchodilators.  Hold steroids for now given acute GI bleeding.  Mild lactic acidosis: This is likely due to hypovolemia.  Repeat lactate.  Type 2 DM: Hold Glipizide  Chronic neck and low back pain/sciatica: Degenerative changes of lumbar spine noted on CT abd/pelvis. Continue Gabapentin. She said she does not take any NSAIDs.  Seizure disorder: Continue Topamax   Other information:   DVT prophylaxis: SCDs Code Status: Full code. Family Communication: Plan discussed with the patient  Disposition Plan: Possible discharge to home in 2 to 3 days Consults called: Gastroenterologist Admission status: Inpatient   The medical decision making on this patient was of high complexity and the patient is at high risk for clinical deterioration, therefore this is a level 3 visit.    Time spent 55 minutes  [facility]   How  to contact the [facility] Attending or Consulting provider [place] or covering provider during after hours [place], for this patient?   1. Check the care team in [facility] and look for a) attending/consulting [facility] provider listed and b) the [facility] team listed 2. Log into [email or web address] and use [facility]'s universal password to access. If you do not have the password, please contact the hospital operator. 3. Locate the  [place] provider you are looking for under [facility] and page to a number that you can be directly reached. 4. If you still have difficulty reaching the provider, please page the [facility] (Director on Call) for the Hospitalists listed on amion for assistance.  10/11/2019, 10:17 AM

## 2019-10-11 NOTE — ED Notes (Addendum)
Report called to endo nurse, Claiborne Billings.

## 2019-10-11 NOTE — ED Notes (Addendum)
Pt requesting RN call to update family on either of the numbers listed under her cell and home numbers. RN able to update family.

## 2019-10-11 NOTE — ED Notes (Signed)
Pharmacy contacted to send pt's protonix, pharmacy said they were tubing it now.

## 2019-10-11 NOTE — ED Triage Notes (Signed)
NV abd pain x 2 days. Pt alert and oriented x 4 upon arrival. PT reporting she started having black stool this morning. No hx of GI bleed. Pain in left and right upper abd.  Pt also reporting pain in lower back and numbness in both legs, right arm numbness and right shoulder pain. Pt continues to add multiple complaints and states they all started 2 days ago.   105NST  95% RA 122/80

## 2019-10-12 LAB — CBC
HCT: 24.9 % — ABNORMAL LOW (ref 36.0–46.0)
Hemoglobin: 7.9 g/dL — ABNORMAL LOW (ref 12.0–15.0)
MCH: 32.9 pg (ref 26.0–34.0)
MCHC: 31.7 g/dL (ref 30.0–36.0)
MCV: 103.8 fL — ABNORMAL HIGH (ref 80.0–100.0)
Platelets: 150 10*3/uL (ref 150–400)
RBC: 2.4 MIL/uL — ABNORMAL LOW (ref 3.87–5.11)
RDW: 13.9 % (ref 11.5–15.5)
WBC: 13.5 10*3/uL — ABNORMAL HIGH (ref 4.0–10.5)
nRBC: 0.1 % (ref 0.0–0.2)

## 2019-10-12 LAB — BASIC METABOLIC PANEL
Anion gap: 7 (ref 5–15)
BUN: 24 mg/dL — ABNORMAL HIGH (ref 6–20)
CO2: 27 mmol/L (ref 22–32)
Calcium: 7.6 mg/dL — ABNORMAL LOW (ref 8.9–10.3)
Chloride: 109 mmol/L (ref 98–111)
Creatinine, Ser: 0.99 mg/dL (ref 0.44–1.00)
GFR calc Af Amer: >60 mL/min (ref >60)
GFR calc non Af Amer: >60 mL/min (ref >60)
Glucose, Bld: 174 mg/dL — ABNORMAL HIGH (ref 70–99)
Potassium: 3.9 mmol/L (ref 3.5–5.1)
Sodium: 143 mmol/L (ref 135–145)

## 2019-10-12 LAB — HEMOGLOBIN AND HEMATOCRIT, BLOOD
HCT: 23.1 % — ABNORMAL LOW (ref 36.0–46.0)
HCT: 21.4 % — ABNORMAL LOW (ref 36.0–46.0)
Hemoglobin: 7.4 g/dL — ABNORMAL LOW (ref 12.0–15.0)
Hemoglobin: 6.9 g/dL — ABNORMAL LOW (ref 12.0–15.0)

## 2019-10-12 NOTE — Plan of Care (Signed)
?  Problem: Clinical Measurements: ?Goal: Ability to maintain clinical measurements within normal limits will improve ?Outcome: Not Progressing ?  ?

## 2019-10-12 NOTE — Progress Notes (Signed)
Jaime Antigua, MD [address] [place], [place], [place], [address] [address], [place], [place], [place], [address] Phone: [phone]  Fax: [phone]   Subjective: Patient denies any bowel movements since the procedure yesterday.  No vomiting.   Objective: Exam: Vital signs in last 24 hours: Vitals:   10/11/19 1519 10/11/19 2033 10/12/19 0426 10/12/19 0738  BP: 110/67 129/70 128/74 132/70  Pulse: 93 98 99 95  Resp: 20 20  19   Temp: 98.5 F (36.9 C) 98.8 F (37.1 C) 98.6 F (37 C) 98.5 F (36.9 C)  TempSrc: Oral Oral Oral Oral  SpO2: 97% 97% 99% 100%  Weight:      Height:       Weight change:   Intake/Output Summary (Last 24 hours) at 10/12/2019 1254 Last data filed at 10/12/2019 1137 Gross per 24 hour  Intake 2418.8 ml  Output 1500 ml  Net 918.8 ml    General: No acute distress, AAO x3 Abd: Soft, NT/ND, No HSM Skin: Warm, no rashes Neck: Supple, Trachea midline   Lab Results: Lab Results  Component Value Date   WBC 13.5 (H) 10/12/2019   HGB 7.9 (L) 10/12/2019   HCT 24.9 (L) 10/12/2019   MCV 103.8 (H) 10/12/2019   PLT 150 10/12/2019   Micro Results: Recent Results (from the past 240 hour(s))  Respiratory Panel by RT PCR (Flu A&B, Covid) - Nasopharyngeal Swab     Status: None   Collection Time: 10/11/19  6:23 AM   Specimen: Nasopharyngeal Swab  Result Value Ref Range Status   SARS Coronavirus 2 by RT PCR NEGATIVE NEGATIVE Final    Comment: (NOTE) SARS-CoV-2 target nucleic acids are NOT DETECTED. The SARS-CoV-2 RNA is generally detectable in upper respiratoy specimens during the acute phase of infection. The lowest concentration of SARS-CoV-2 viral copies this assay can detect is 131 copies/mL. A negative result does not preclude SARS-Cov-2 infection and should not be used as the sole basis for treatment or other patient management decisions. A negative result may occur with  improper specimen collection/handling, submission of  specimen other than nasopharyngeal swab, presence of viral mutation(s) within the areas targeted by this assay, and inadequate number of viral copies (<131 copies/mL). A negative result must be combined with clinical observations, patient history, and epidemiological information. The expected result is Negative. Fact Sheet for Patients:  PinkCheek.be Fact Sheet for Healthcare Providers:  GravelBags.it This test is not yet ap proved or cleared by the [place] FDA and  has been authorized for detection and/or diagnosis of SARS-CoV-2 by FDA under an Emergency Use Authorization (EUA). This EUA will remain  in effect (meaning this test can be used) for the duration of the COVID-19 declaration under Section 564(b)(1) of the Act, 21 U.S.C. section 360bbb-3(b)(1), unless the authorization is terminated or revoked sooner.    Influenza A by PCR NEGATIVE NEGATIVE Final   Influenza B by PCR NEGATIVE NEGATIVE Final    Comment: (NOTE) The Xpert Xpress SARS-CoV-2/FLU/RSV assay is intended as an aid in  the diagnosis of influenza from Nasopharyngeal swab specimens and  should not be used as a sole basis for treatment. Nasal washings and  aspirates are unacceptable for Xpert Xpress SARS-CoV-2/FLU/RSV  testing. Fact Sheet for Patients: PinkCheek.be Fact Sheet for Healthcare Providers: GravelBags.it This test is not yet approved or cleared by the [place] FDA and  has been authorized for detection and/or diagnosis of SARS-CoV-2 by  FDA under an Emergency Use Authorization (EUA). This EUA will  remain  in effect (meaning this test can be used) for the duration of the  Covid-19 declaration under Section 564(b)(1) of the Act, 21  U.S.C. section 360bbb-3(b)(1), unless the authorization is  terminated or revoked. Performed at [facility], [address]  [place], [place] [address]    Studies/Results: CT Angio Abd/Pel w/ and/or w/o  Result Date: 10/11/2019 CLINICAL DATA:  Abdominal pain, GI bleed EXAM: CT ANGIOGRAPHY ABDOMEN AND PELVIS WITH CONTRAST AND WITHOUT CONTRAST TECHNIQUE: Multidetector CT imaging of the abdomen and pelvis was performed using the standard protocol during bolus administration of intravenous contrast. Multiplanar reconstructed images and MIPs were obtained and reviewed to evaluate the vascular anatomy. CONTRAST:  51mL OMNIPAQUE IOHEXOL 350 MG/ML SOLN COMPARISON:  11/04/2016 FINDINGS: VASCULAR Aorta: minor aortic atherosclerosis without aneurysm, dissection, occlusive process, evidence of rupture, or retroperitoneal hematoma. Celiac: Widely patent origin including its branches. No active arterial bleeding in the celiac distribution. SMA: Widely patent origin including its branches. No active arterial bleeding in the SMA distribution. Renals: Atherosclerotic origins with heavy calcification at the left renal ostium. Difficult to exclude left renal artery stenosis. Right renal artery appears patent. No accessory renal artery. IMA: Patent origin off the distal aorta. Distal branches are not opacified. No active arterial bleeding in the IMA territory appreciated. Inflow: Common, internal and external iliac arteries all remain patent. Minor atherosclerosis and tortuosity without occlusive process or inflow disease. Proximal Outflow: Visualized common femoral, proximal profunda femoral, proximal superficial femoral arteries are all patent. Veins: No veno-occlusive process. Review of the MIP images confirms the above findings. NON-VASCULAR Lower chest: Dependent bibasilar atelectasis. Chronic inferior lingula and right middle lobe scarring. No acute lower chest process. Native coronary atherosclerosis noted. Normal heart size. No pericardial or pleural effusion. Hepatobiliary: Focal fatty infiltration of the liver along the falciform ligament  anteriorly in the left hepatic lobe, image 23 series 9. No other hepatic abnormality or intrahepatic biliary dilatation. Hepatic and portal veins are patent. Gallbladder and biliary system unremarkable. Common bile duct nondilated. Pancreas: Unremarkable. No pancreatic ductal dilatation or surrounding inflammatory changes. Spleen: Normal in size without focal abnormality. Adrenals/Urinary Tract: Normal adrenal glands. Kidneys demonstrate no acute process or hydronephrosis. No focal abnormality. Ureters are symmetric and decompressed. No obstructing ureteral calculus. No UVJ abnormality. Bladder unremarkable. Stomach/Bowel: Negative for bowel obstruction, significant dilatation, ileus, or free air. Normal appendix. Colonic diverticulosis most pronounced in the sigmoid without acute inflammatory process. No fluid collection, hemorrhage, hematoma, abscess, ascites or adenopathy. Lymphatic: No bulky adenopathy. Reproductive: Status post hysterectomy. No adnexal masses. Other: No abdominal wall hernia or abnormality. No abdominopelvic ascites. Musculoskeletal: Degenerative changes noted of the spine. Lower lumbar facet arthropathy. No acute osseous finding. IMPRESSION: VASCULAR Abdominal atherosclerosis as detailed above without active arterial bleeding or other acute vascular finding. NON-VASCULAR No other acute intra-abdominal or pelvic finding by CTA. Colonic diverticulosis without acute inflammatory process Electronically Signed   By: Jerilynn Mages.  Shick M.D.   On: 10/11/2019 09:48   Medications:  Scheduled Meds: . cyanocobalamin  1,000 mcg Intramuscular Q24H  . gabapentin  300 mg Oral TID  . [START ON 10/14/2019] pantoprazole  40 mg Intravenous Q12H  . pneumococcal 23 valent vaccine  0.5 mL Intramuscular Tomorrow-1000  . pravastatin  40 mg Oral Daily  . topiramate  200 mg Oral BID   Continuous Infusions: . dextrose 5 % and 0.9% NaCl 100 mL/hr at 10/12/19 0600  . pantoprozole (PROTONIX) infusion 8 mg/hr (10/12/19  0822)   PRN Meds:.acetaminophen **OR** acetaminophen, albuterol,  ondansetron **OR** ondansetron (ZOFRAN) IV, polyethylene glycol   Assessment: Principal Problem:   Acute GI bleeding Active Problems:   Hypotension   AKI (acute kidney injury) ([place])   Syncope and collapse   Type 2 diabetes mellitus (HCC)   Acute blood loss anemia   Acute gastric ulcer with hemorrhage    Plan: No further bleeding since procedure yesterday PPI IV twice daily  Continue serial CBCs and transfuse PRN Avoid NSAIDs Maintain 2 large-bore IV lines Please page GI with any acute hemodynamic changes, or signs of active GI bleeding  Repeat EGD in 1-2 months to reevaluate gastric mucosa that was not able to be evaluated thoroughly due to presence of blood during the last exam.  Obtain biopsies during the procedure if needed  However, if patient has active signs of rebleeding during this admission, repeat EGD while inpatient.  None present at this time     LOS: 1 day   Jaime Antigua, MD 10/12/2019, 12:54 PM

## 2019-10-12 NOTE — Progress Notes (Signed)
PROGRESS NOTE    Jaime Fitzgerald  [id number] DOB: [birth year] DOA: 10/11/2019 PCP: [facility]    Brief Narrative:  Jaime Fitzgerald is an 59 y.o. 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  Also c/o abd pain.The patient was tachycardic, hypotensive in the emergency room.  She had elevated creatinine of 1.45, elevated BUN of 50, elevated WBC of 16.9, elevated lactic acid of 3.4 and decreased hemoglobin of 8.6.  She was given a bolus of IV Protonix and started on IV Protonix infusion in the emergency room.  She was also given 1 L of normal saline and transfusion of 1 unit of packed red blood cells was initiated in the emergency room. GI was consulted, pt was taken for Upper GI endoscopy.   Consultants:   GI  Procedures: EGD  Antimicrobials:   None    Subjective: Pt c/o abd pain, but states has no bm now. No n/v. No other compliants.  Objective: Vitals:   10/11/19 1519 10/11/19 2033 10/12/19 0426 10/12/19 0738  BP: 110/67 129/70 128/74 132/70  Pulse: 93 98 99 95  Resp: 20 20  19   Temp: 98.5 F (36.9 C) 98.8 F (37.1 C) 98.6 F (37 C) 98.5 F (36.9 C)  TempSrc: Oral Oral Oral Oral  SpO2: 97% 97% 99% 100%  Weight:      Height:        Intake/Output Summary (Last 24 hours) at 10/12/2019 1535 Last data filed at 10/12/2019 1300 Gross per 24 hour  Intake 2378.8 ml  Output 1500 ml  Net 878.8 ml   Filed Weights   10/11/19 0613  Weight: 81.6 kg    Examination:  General exam: Appears calm and comfortable , nad Respiratory system: Clear to auscultation. Respiratory effort normal. Cardiovascular system: S1 & S2 heard, RRR. No JVD, murmurs, rubs, gallops or clicks.  Gastrointestinal system: Abdomen is nondistended, soft and nontender. Normal bowel sounds heard. Central nervous system:  Alert and oriented. No focal neurological deficits. Extremities: no edema Skin warm dry Psychiatry: Judgement and insight appear normal. Mood & affect appropriate.     Data Reviewed: I have personally reviewed following labs and imaging studies  CBC: Recent Labs  Lab 10/11/19 0623 10/11/19 1105 10/11/19 1755 10/12/19 0611  WBC 16.9*  --  18.2* 13.5*  NEUTROABS 13.0*  --   --   --   HGB 8.6* 9.1* 9.5* 7.9*  HCT 26.5* 28.7* 29.7* 24.9*  MCV 105.2*  --  104.2* 103.8*  PLT 210  --  163 [id number]   Basic Metabolic Panel: Recent Labs  Lab 10/11/19 0623 10/12/19 0611  NA 140 143  K 3.6 3.9  CL 102 109  CO2 28 27  GLUCOSE 140* 174*  BUN 50* 24*  CREATININE 1.45* 0.99  CALCIUM 8.5* 7.6*   GFR: Estimated Creatinine Clearance: 61.2 mL/min (by C-G formula based on SCr of 0.99 mg/dL). Liver Function Tests: Recent Labs  Lab 10/11/19 0623  AST 28  ALT 14  ALKPHOS 45  BILITOT 0.5  PROT 5.8*  ALBUMIN 3.3*   Recent Labs  Lab 10/11/19 0623  LIPASE 31   No results for input(s): AMMONIA in the last 168 hours. Coagulation Profile: Recent Labs  Lab 10/11/19 0623  INR 1.1   Cardiac Enzymes: No results for input(s): CKTOTAL, CKMB, CKMBINDEX, TROPONINI in the last 168 hours. BNP (last  3 results) No results for input(s): PROBNP in the last 8760 hours. HbA1C: No results for input(s): HGBA1C in the last 72 hours. CBG: Recent Labs  Lab 10/11/19 1141 10/11/19 1418  GLUCAP 136* 154*   Lipid Profile: No results for input(s): CHOL, HDL, LDLCALC, TRIG, CHOLHDL, LDLDIRECT in the last 72 hours. Thyroid Function Tests: No results for input(s): TSH, T4TOTAL, FREET4, T3FREE, THYROIDAB in the last 72 hours. Anemia Panel: Recent Labs    10/11/19 0925 10/11/19 1105  VITAMINB12 137*  --   FERRITIN  --  50  TIBC  --  210*  IRON  --  92   Sepsis Labs: Recent Labs  Lab 10/11/19 0623 10/11/19 1100  LATICACIDVEN 3.4* 1.5    Recent Results (from the past 240 hour(s))    Respiratory Panel by RT PCR (Flu A&B, Covid) - Nasopharyngeal Swab     Status: None   Collection Time: 10/11/19  6:23 AM   Specimen: Nasopharyngeal Swab  Result Value Ref Range Status   SARS Coronavirus 2 by RT PCR NEGATIVE NEGATIVE Final    Comment: (NOTE) SARS-CoV-2 target nucleic acids are NOT DETECTED. The SARS-CoV-2 RNA is generally detectable in upper respiratoy specimens during the acute phase of infection. The lowest concentration of SARS-CoV-2 viral copies this assay can detect is 131 copies/mL. A negative result does not preclude SARS-Cov-2 infection and should not be used as the sole basis for treatment or other patient management decisions. A negative result may occur with  improper specimen collection/handling, submission of specimen other than nasopharyngeal swab, presence of viral mutation(s) within the areas targeted by this assay, and inadequate number of viral copies (<131 copies/mL). A negative result must be combined with clinical observations, patient history, and epidemiological information. The expected result is Negative. Fact Sheet for Patients:  PinkCheek.be Fact Sheet for Healthcare Providers:  GravelBags.it This test is not yet ap proved or cleared by the [place] FDA and  has been authorized for detection and/or diagnosis of SARS-CoV-2 by FDA under an Emergency Use Authorization (EUA). This EUA will remain  in effect (meaning this test can be used) for the duration of the COVID-19 declaration under Section 564(b)(1) of the Act, 21 U.S.C. section 360bbb-3(b)(1), unless the authorization is terminated or revoked sooner.    Influenza A by PCR NEGATIVE NEGATIVE Final   Influenza B by PCR NEGATIVE NEGATIVE Final    Comment: (NOTE) The Xpert Xpress SARS-CoV-2/FLU/RSV assay is intended as an aid in  the diagnosis of influenza from Nasopharyngeal swab specimens and  should not be used as a sole  basis for treatment. Nasal washings and  aspirates are unacceptable for Xpert Xpress SARS-CoV-2/FLU/RSV  testing. Fact Sheet for Patients: PinkCheek.be Fact Sheet for Healthcare Providers: GravelBags.it This test is not yet approved or cleared by the [place] FDA and  has been authorized for detection and/or diagnosis of SARS-CoV-2 by  FDA under an Emergency Use Authorization (EUA). This EUA will remain  in effect (meaning this test can be used) for the duration of the  Covid-19 declaration under Section 564(b)(1) of the Act, 21  U.S.C. section 360bbb-3(b)(1), unless the authorization is  terminated or revoked. Performed at [facility], [address] [place], [place] [address]          Radiology Studies: CT Angio Abd/Pel w/ and/or w/o  Result Date: 10/11/2019 CLINICAL DATA:  Abdominal pain, GI bleed EXAM: CT ANGIOGRAPHY ABDOMEN AND PELVIS WITH CONTRAST AND WITHOUT CONTRAST TECHNIQUE: Multidetector CT imaging of the abdomen  and pelvis was performed using the standard protocol during bolus administration of intravenous contrast. Multiplanar reconstructed images and MIPs were obtained and reviewed to evaluate the vascular anatomy. CONTRAST:  76mL OMNIPAQUE IOHEXOL 350 MG/ML SOLN COMPARISON:  11/04/2016 FINDINGS: VASCULAR Aorta: minor aortic atherosclerosis without aneurysm, dissection, occlusive process, evidence of rupture, or retroperitoneal hematoma. Celiac: Widely patent origin including its branches. No active arterial bleeding in the celiac distribution. SMA: Widely patent origin including its branches. No active arterial bleeding in the SMA distribution. Renals: Atherosclerotic origins with heavy calcification at the left renal ostium. Difficult to exclude left renal artery stenosis. Right renal artery appears patent. No accessory renal artery. IMA: Patent origin off the distal aorta. Distal branches are not  opacified. No active arterial bleeding in the IMA territory appreciated. Inflow: Common, internal and external iliac arteries all remain patent. Minor atherosclerosis and tortuosity without occlusive process or inflow disease. Proximal Outflow: Visualized common femoral, proximal profunda femoral, proximal superficial femoral arteries are all patent. Veins: No veno-occlusive process. Review of the MIP images confirms the above findings. NON-VASCULAR Lower chest: Dependent bibasilar atelectasis. Chronic inferior lingula and right middle lobe scarring. No acute lower chest process. Native coronary atherosclerosis noted. Normal heart size. No pericardial or pleural effusion. Hepatobiliary: Focal fatty infiltration of the liver along the falciform ligament anteriorly in the left hepatic lobe, image 23 series 9. No other hepatic abnormality or intrahepatic biliary dilatation. Hepatic and portal veins are patent. Gallbladder and biliary system unremarkable. Common bile duct nondilated. Pancreas: Unremarkable. No pancreatic ductal dilatation or surrounding inflammatory changes. Spleen: Normal in size without focal abnormality. Adrenals/Urinary Tract: Normal adrenal glands. Kidneys demonstrate no acute process or hydronephrosis. No focal abnormality. Ureters are symmetric and decompressed. No obstructing ureteral calculus. No UVJ abnormality. Bladder unremarkable. Stomach/Bowel: Negative for bowel obstruction, significant dilatation, ileus, or free air. Normal appendix. Colonic diverticulosis most pronounced in the sigmoid without acute inflammatory process. No fluid collection, hemorrhage, hematoma, abscess, ascites or adenopathy. Lymphatic: No bulky adenopathy. Reproductive: Status post hysterectomy. No adnexal masses. Other: No abdominal wall hernia or abnormality. No abdominopelvic ascites. Musculoskeletal: Degenerative changes noted of the spine. Lower lumbar facet arthropathy. No acute osseous finding. IMPRESSION:  VASCULAR Abdominal atherosclerosis as detailed above without active arterial bleeding or other acute vascular finding. NON-VASCULAR No other acute intra-abdominal or pelvic finding by CTA. Colonic diverticulosis without acute inflammatory process Electronically Signed   By: Jerilynn Mages.  Shick M.D.   On: 10/11/2019 09:48        Scheduled Meds: . cyanocobalamin  1,000 mcg Intramuscular Q24H  . gabapentin  300 mg Oral TID  . [START ON 10/14/2019] pantoprazole  40 mg Intravenous Q12H  . pneumococcal 23 valent vaccine  0.5 mL Intramuscular Tomorrow-1000  . pravastatin  40 mg Oral Daily  . topiramate  200 mg Oral BID   Continuous Infusions: . dextrose 5 % and 0.9% NaCl 100 mL/hr at 10/12/19 0600  . pantoprozole (PROTONIX) infusion 8 mg/hr (10/12/19 [id number])    Assessment & Plan:   Principal Problem:   Acute GI bleeding Active Problems:   Hypotension   AKI (acute kidney injury) ([place])   Syncope and collapse   Type 2 diabetes mellitus (HCC)   Acute blood loss anemia   Acute gastric ulcer with hemorrhage   Acute GI bleeding/abdominal pain:  GI consulted-status post upper endoscopy finding with 2 oozing gastric ulcers treated with argon plasma coagulation  Continue IV Protonix   CTA abd/pelvis did not show any acute abnormality. There is evidence of  fatty liver.  Check serial H&H transfuse Transfuse if hemoglobin less than 7 If hemodynamically unstable signs of active rebleeding may need a repeat EGD while inpatient otherwise repeat EGD in 1 month to reevaluate gastric mucosa that was not able to be evaluated thoroughly due to presence of blood during the last exam. Status post 1 unit packed red blood cell in the ED avoid nsaids and asa   Hypotension: Secondary to GI bleed  treat with IV fluids and packed red blood cell . Hold antihypertensives Currently stabilized  S/p syncope: Likely from GI bleeding/hypotension  Acute kidney injury: This is likely prerenal from GI  bleeding. Resolved  Leukocytosis: This is likely reactive.  Monitor CBC  Mild acute asthma exacerbation: Treat with bronchodilators.  Resolved  Mild lactic acidosis: This is likely due to hypovolemia.  Repeat lactate is normal  Type 2 DM: Hold Glipizide Check fingersticks  Chronic neck and low back pain/sciatica: Degenerative changes of lumbar spine noted on CT abd/pelvis. Continue Gabapentin. She said she does not take any NSAIDs.  Seizure disorder: Continue Topamax   DVT prophylaxis: SCD Code Status: Full Family Communication: None at bedside Disposition Plan: Possibly DC in 1 to 2 days if hemodynamically stable and no signs of rebleeding       LOS: 1 day   Time spent: 45 minutes with more than 50% COC    Nolberto Hanlon, MD [facility] Pager [phone]  If 7PM-7AM, please contact night-coverage [email or web address] Password [facility] 10/12/2019, 3:35 PM

## 2019-10-13 ENCOUNTER — Inpatient Hospital Stay: Payer: Medicaid Other | Admitting: Anesthesiology

## 2019-10-13 ENCOUNTER — Encounter: Payer: Self-pay | Admitting: *Deleted

## 2019-10-13 ENCOUNTER — Encounter
Admission: EM | Disposition: A | Discharge status: Home / Self Care | Payer: Self-pay | Source: Home / Self Care | Attending: Internal Medicine

## 2019-10-13 DIAGNOSIS — K253 Acute gastric ulcer without hemorrhage or perforation: Secondary | ICD-10-CM

## 2019-10-13 HISTORY — PX: ESOPHAGOGASTRODUODENOSCOPY: SHX5428

## 2019-10-13 LAB — GLUCOSE, CAPILLARY
Glucose-Capillary: 167 mg/dL — ABNORMAL HIGH (ref 70–99)
Glucose-Capillary: 133 mg/dL — ABNORMAL HIGH (ref 70–99)
Glucose-Capillary: 190 mg/dL — ABNORMAL HIGH (ref 70–99)

## 2019-10-13 LAB — CBC
HCT: 26.4 % — ABNORMAL LOW (ref 36.0–46.0)
Hemoglobin: 8.1 g/dL — ABNORMAL LOW (ref 12.0–15.0)
MCH: 30.7 pg (ref 26.0–34.0)
MCHC: 30.7 g/dL (ref 30.0–36.0)
MCV: 100 fL (ref 80.0–100.0)
Platelets: 134 10*3/uL — ABNORMAL LOW (ref 150–400)
RBC: 2.64 MIL/uL — ABNORMAL LOW (ref 3.87–5.11)
RDW: 18.8 % — ABNORMAL HIGH (ref 11.5–15.5)
WBC: 9.4 10*3/uL (ref 4.0–10.5)
nRBC: 0 % (ref 0.0–0.2)

## 2019-10-13 LAB — HEMOGLOBIN AND HEMATOCRIT, BLOOD
HCT: 27.7 % — ABNORMAL LOW (ref 36.0–46.0)
Hemoglobin: 8.7 g/dL — ABNORMAL LOW (ref 12.0–15.0)

## 2019-10-13 SURGERY — EGD (ESOPHAGOGASTRODUODENOSCOPY)
Anesthesia: General

## 2019-10-13 MED ORDER — INSULIN ASPART 100 UNIT/ML ~~LOC~~ SOLN
0.0000 [IU] | Freq: Three times a day (TID) | SUBCUTANEOUS | Status: DC
  Administered 2019-10-14 (×2): 1 [IU] via SUBCUTANEOUS
  Filled 2019-10-13 (×2): qty 1

## 2019-10-13 MED ORDER — LIDOCAINE HCL (PF) 2 % IJ SOLN
INTRAMUSCULAR | Status: DC | PRN
  Administered 2019-10-13: 100 mg via INTRADERMAL

## 2019-10-13 MED ORDER — PANTOPRAZOLE SODIUM 40 MG IV SOLR
40.0000 mg | Freq: Two times a day (BID) | INTRAVENOUS | Status: DC
  Administered 2019-10-13 – 2019-10-14 (×3): 40 mg via INTRAVENOUS
  Filled 2019-10-13 (×6): qty 40

## 2019-10-13 MED ORDER — PROPOFOL 10 MG/ML IV BOLUS
INTRAVENOUS | Status: DC | PRN
  Administered 2019-10-13: 40 mg via INTRAVENOUS

## 2019-10-13 MED ORDER — SODIUM CHLORIDE 0.9 % IV SOLN
INTRAVENOUS | Status: DC
  Administered 2019-10-13: 1000 mL via INTRAVENOUS

## 2019-10-13 MED ORDER — PROPOFOL 500 MG/50ML IV EMUL
INTRAVENOUS | Status: DC | PRN
  Administered 2019-10-13: 125 ug/kg/min via INTRAVENOUS

## 2019-10-13 MED ORDER — INSULIN ASPART 100 UNIT/ML ~~LOC~~ SOLN: 0.0000 [IU] | Freq: Every day | SUBCUTANEOUS | Status: DC

## 2019-10-13 MED ORDER — SODIUM CHLORIDE 0.9% IV SOLUTION: Freq: Once | INTRAVENOUS | Status: AC

## 2019-10-13 NOTE — Anesthesia Preprocedure Evaluation (Signed)
Anesthesia Evaluation  Patient identified by MRN, date of birth, ID band Patient awake    Reviewed: Allergy & Precautions, NPO status , Patient's Chart, lab work & pertinent test results  History of Anesthesia Complications Negative for: history of anesthetic complications  Airway Mallampati: II  TM Distance: >3 FB Neck ROM: Full    Dental  (+) Edentulous Upper, Edentulous Lower   Pulmonary asthma , neg sleep apnea, Current Smoker and Patient abstained from smoking.,    breath sounds clear to auscultation- rhonchi (-) wheezing      Cardiovascular hypertension, Pt. on medications (-) CAD, (-) Past MI, (-) Cardiac Stents and (-) CABG  Rhythm:Regular Rate:Normal - Systolic murmurs and - Diastolic murmurs    Neuro/Psych Seizures -,  negative psych ROS   GI/Hepatic PUD,   Endo/Other  diabetes, Oral Hypoglycemic Agents  Renal/GU Renal InsufficiencyRenal disease     Musculoskeletal negative musculoskeletal ROS (+)   Abdominal (+) + obese,   Peds  Hematology  (+) anemia ,   Anesthesia Other Findings Past Medical History: No date: Asthma No date: Diabetes mellitus without complication (HCC) No date: Hypertension No date: Seizures ([place])   Reproductive/Obstetrics                             Anesthesia Physical Anesthesia Plan  ASA: III  Anesthesia Plan: General   Post-op Pain Management:    Induction: Intravenous  PONV Risk Score and Plan: 2 and Propofol infusion  Airway Management Planned: Natural Airway  Additional Equipment:   Intra-op Plan:   Post-operative Plan:   Informed Consent: I have reviewed the patients History and Physical, chart, labs and discussed the procedure including the risks, benefits and alternatives for the proposed anesthesia with the patient or authorized representative who has indicated his/her understanding and acceptance.     Dental advisory  given  Plan Discussed with: CRNA and Anesthesiologist  Anesthesia Plan Comments:         Anesthesia Quick Evaluation

## 2019-10-13 NOTE — Progress Notes (Signed)
Pt Hbg level at 6.9. NP messaged regarding verbal orders given in report to notify hospitalist if Hbg is 7 or below. Awaiting orders. Pt has no concerns.

## 2019-10-13 NOTE — Transfer of Care (Signed)
Immediate Anesthesia Transfer of Care Note  Patient: Jaime Fitzgerald  Procedure(s) Performed: ESOPHAGOGASTRODUODENOSCOPY (EGD) (N/A )  Patient Location: PACU  Anesthesia Type:General  Level of Consciousness: awake, alert  and oriented  Airway & Oxygen Therapy: Patient Spontanous Breathing and Patient connected to nasal cannula oxygen  Post-op Assessment: Report given to RN and Post -op Vital signs reviewed and stable  Post vital signs: Reviewed and stable  Last Vitals:  Vitals Value Taken Time  BP 114/99 10/13/19 1144  Temp 36.6 C 10/13/19 1144  Pulse 92 10/13/19 1146  Resp 19 10/13/19 1146  SpO2 95 % 10/13/19 1146  Vitals shown include unvalidated device data.  Last Pain:  Vitals:   10/13/19 1144  TempSrc: Temporal  PainSc: 0-No pain         Complications: No apparent anesthesia complications

## 2019-10-13 NOTE — Progress Notes (Signed)
Jaime Antigua, MD [address] [place], [place], [place], [address] [address], [place], [place], [place], [address] Phone: [phone]  Fax: [phone]   Subjective: No further episodes of bleeding. Hemoglobin decreased from yesterday   Objective: Exam: Vital signs in last 24 hours: Vitals:   10/13/19 0241 10/13/19 0312 10/13/19 0541 10/13/19 0856  BP: 119/73 116/61 122/70 116/76  Pulse: 82 81 78 74  Resp: 16 20 20 18   Temp: 98.2 F (36.8 C) 97.8 F (36.6 C) 98.3 F (36.8 C) 97.9 F (36.6 C)  TempSrc: Oral Oral Oral Oral  SpO2: 100% 100% 99% 100%  Weight:  86.6 kg    Height:       Weight change:   Intake/Output Summary (Last 24 hours) at 10/13/2019 1131 Last data filed at 10/13/2019 0940 Gross per 24 hour  Intake 3817.9 ml  Output 1300 ml  Net 2517.9 ml    General: No acute distress, AAO x3 Abd: Soft, NT/ND, No HSM Skin: Warm, no rashes Neck: Supple, Trachea midline   Lab Results: Lab Results  Component Value Date   WBC 9.4 10/13/2019   HGB 8.1 (L) 10/13/2019   HCT 26.4 (L) 10/13/2019   MCV 100.0 10/13/2019   PLT 134 (L) 10/13/2019   Micro Results: Recent Results (from the past 240 hour(s))  Respiratory Panel by RT PCR (Flu A&B, Covid) - Nasopharyngeal Swab     Status: None   Collection Time: 10/11/19  6:23 AM   Specimen: Nasopharyngeal Swab  Result Value Ref Range Status   SARS Coronavirus 2 by RT PCR NEGATIVE NEGATIVE Final    Comment: (NOTE) SARS-CoV-2 target nucleic acids are NOT DETECTED. The SARS-CoV-2 RNA is generally detectable in upper respiratoy specimens during the acute phase of infection. The lowest concentration of SARS-CoV-2 viral copies this assay can detect is 131 copies/mL. A negative result does not preclude SARS-Cov-2 infection and should not be used as the sole basis for treatment or other patient management decisions. A negative result may occur with  improper specimen collection/handling, submission of specimen  other than nasopharyngeal swab, presence of viral mutation(s) within the areas targeted by this assay, and inadequate number of viral copies (<131 copies/mL). A negative result must be combined with clinical observations, patient history, and epidemiological information. The expected result is Negative. Fact Sheet for Patients:  PinkCheek.be Fact Sheet for Healthcare Providers:  GravelBags.it This test is not yet ap proved or cleared by the [place] FDA and  has been authorized for detection and/or diagnosis of SARS-CoV-2 by FDA under an Emergency Use Authorization (EUA). This EUA will remain  in effect (meaning this test can be used) for the duration of the COVID-19 declaration under Section 564(b)(1) of the Act, 21 U.S.C. section 360bbb-3(b)(1), unless the authorization is terminated or revoked sooner.    Influenza A by PCR NEGATIVE NEGATIVE Final   Influenza B by PCR NEGATIVE NEGATIVE Final    Comment: (NOTE) The Xpert Xpress SARS-CoV-2/FLU/RSV assay is intended as an aid in  the diagnosis of influenza from Nasopharyngeal swab specimens and  should not be used as a sole basis for treatment. Nasal washings and  aspirates are unacceptable for Xpert Xpress SARS-CoV-2/FLU/RSV  testing. Fact Sheet for Patients: PinkCheek.be Fact Sheet for Healthcare Providers: GravelBags.it This test is not yet approved or cleared by the [place] FDA and  has been authorized for detection and/or diagnosis of SARS-CoV-2 by  FDA under an Emergency Use Authorization (EUA). This EUA will remain  in  effect (meaning this test can be used) for the duration of the  Covid-19 declaration under Section 564(b)(1) of the Act, 21  U.S.C. section 360bbb-3(b)(1), unless the authorization is  terminated or revoked. Performed at [facility] [address], [place], [place]  [address]    Studies/Results: No results found. Medications:  Scheduled Meds: . [MAR Hold] cyanocobalamin  1,000 mcg Intramuscular Q24H  . [MAR Hold] gabapentin  300 mg Oral TID  . [MAR Hold] pantoprazole  40 mg Intravenous Q12H  . pneumococcal 23 valent vaccine  0.5 mL Intramuscular Tomorrow-1000  . [MAR Hold] pravastatin  40 mg Oral Daily  . [MAR Hold] topiramate  200 mg Oral BID   Continuous Infusions: . sodium chloride 1,000 mL (10/13/19 1101)  . dextrose 5 % and 0.9% NaCl Stopped (10/13/19 0929)  . pantoprozole (PROTONIX) infusion Stopped (10/13/19 0933)   PRN Meds:.[MAR Hold] acetaminophen **OR** [MAR Hold] acetaminophen, [MAR Hold] albuterol, [MAR Hold] ondansetron **OR** [MAR Hold] ondansetron (ZOFRAN) IV, [MAR Hold] polyethylene glycol   Assessment: Principal Problem:   Acute GI bleeding Active Problems:   Hypotension   AKI (acute kidney injury) ([place])   Syncope and collapse   Type 2 diabetes mellitus ([place])   Acute blood loss anemia   Acute gastric ulcer with hemorrhage    Plan: Due to drop in hemoglobin repeat EGD for reassessment of ulcer sites is indicated  Continue NPO  Patient's abdominal pain is from her known ulcers  See EGD report for further recommendations post procedure  PPI IV twice daily  Continue serial CBCs and transfuse PRN Avoid NSAIDs Maintain 2 large-bore IV lines Please page GI with any acute hemodynamic changes, or signs of active GI bleeding  I have discussed alternative options, risks & benefits,  which include, but are not limited to, bleeding, infection, perforation,respiratory complication & drug reaction.  The patient agrees with this plan & written consent will be obtained.       LOS: 2 days   Jaime Antigua, MD 10/13/2019, 11:31 AM

## 2019-10-13 NOTE — Progress Notes (Signed)
PROGRESS NOTE    Jaime Fitzgerald  [id number] DOB: [birth year] DOA: 10/11/2019 PCP: [facility]    Brief Narrative:  Jaime Fitzgerald an 59 y.o.femalewith medical history significant for hypertension, seizures, diabetes mellitus, asthma, obesity,chronic neck and low back pain with sciatica who presented to the hospital with hematemesis and melena. Also c/o abd pain.The patient was tachycardic, hypotensive in the emergency room. She had elevated creatinine of 1.45, elevated BUN of 50, elevated WBC of 16.9, elevated lactic acid of 3.4 and decreased hemoglobin of 8.6. She was given a bolus of IV Protonix and started on IV Protonix infusion in the emergency room. She was also given 1 L of normal saline and transfusion of 1 unit of packed red blood cells was initiated in the emergency room. GI was consulted, pt was taken for Upper GI endoscopy.   Consultants:   GI  Procedures: EGD  Antimicrobials:   None    Subjective: Pt reports abdominal pain is mildly improved.  No further episodes of bleed this AM.  No new complaints. Objective: Vitals:   10/13/19 1154 10/13/19 1220 10/13/19 1329 10/13/19 1514  BP: (!) 102/52  138/85 (!) 146/69  Pulse:  83 85 91  Resp:  16 18 18   Temp:   98.2 F (36.8 C) 98.2 F (36.8 C)  TempSrc:    Oral  SpO2:  99% 100% 98%  Weight:      Height:        Intake/Output Summary (Last 24 hours) at 10/13/2019 1622 Last data filed at 10/13/2019 1353 Gross per 24 hour  Intake 2556.06 ml  Output 900 ml  Net 1656.06 ml   Filed Weights   10/11/19 0613 10/13/19 0312  Weight: 81.6 kg 86.6 kg    Examination:  General exam: Appears calm and comfortable , nad Respiratory system: Clear to auscultation. Respiratory effort normal.  No wheeze rales rhonchi's Cardiovascular system: S1 & S2 heard, RRR. No JVD, murmurs, rubs, gallops or clicks.  Gastrointestinal system: Abdomen is nondistended, soft and nontender. Normal bowel  sounds heard. Central nervous system: Alert and oriented. No focal neurological deficits. Extremities: No edema Skin: Warm and dry Psychiatry: Judgement and insight appear normal. Mood & affect appropriate.     Data Reviewed: I have personally reviewed following labs and imaging studies  CBC: Recent Labs  Lab 10/11/19 0623 10/11/19 1755 10/12/19 0611 10/12/19 1552 10/12/19 2247 10/13/19 0643  WBC 16.9* 18.2* 13.5*  --   --  9.4  NEUTROABS 13.0*  --   --   --   --   --   HGB 8.6* 9.5* 7.9* 7.4* 6.9* 8.1*  HCT 26.5* 29.7* 24.9* 23.1* 21.4* 26.4*  MCV 105.2* 104.2* 103.8*  --   --  100.0  PLT 210 163 150  --   --  [id number]*   Basic Metabolic Panel: Recent Labs  Lab 10/11/19 0623 10/12/19 0611  NA 140 143  K 3.6 3.9  CL 102 109  CO2 28 27  GLUCOSE 140* 174*  BUN 50* 24*  CREATININE 1.45* 0.99  CALCIUM 8.5* 7.6*   GFR: Estimated Creatinine Clearance: 63.1 mL/min (by C-G formula based on SCr of 0.99 mg/dL). Liver Function Tests: Recent Labs  Lab 10/11/19 0623  AST 28  ALT 14  ALKPHOS 45  BILITOT 0.5  PROT 5.8*  ALBUMIN 3.3*   Recent Labs  Lab 10/11/19 0623  LIPASE 31   No results for input(s): AMMONIA in the last 168 hours. Coagulation Profile: Recent  Labs  Lab 10/11/19 0623  INR 1.1   Cardiac Enzymes: No results for input(s): CKTOTAL, CKMB, CKMBINDEX, TROPONINI in the last 168 hours. BNP (last 3 results) No results for input(s): PROBNP in the last 8760 hours. HbA1C: No results for input(s): HGBA1C in the last 72 hours. CBG: Recent Labs  Lab 10/11/19 1141 10/11/19 1418 10/13/19 1106  GLUCAP 136* 154* 167*   Lipid Profile: No results for input(s): CHOL, HDL, LDLCALC, TRIG, CHOLHDL, LDLDIRECT in the last 72 hours. Thyroid Function Tests: No results for input(s): TSH, T4TOTAL, FREET4, T3FREE, THYROIDAB in the last 72 hours. Anemia Panel: Recent Labs    10/11/19 0925 10/11/19 1105  VITAMINB12 137*  --   FERRITIN  --  50  TIBC  --  210*  IRON   --  92   Sepsis Labs: Recent Labs  Lab 10/11/19 0623 10/11/19 1100  LATICACIDVEN 3.4* 1.5    Recent Results (from the past 240 hour(s))  Respiratory Panel by RT PCR (Flu A&B, Covid) - Nasopharyngeal Swab     Status: None   Collection Time: 10/11/19  6:23 AM   Specimen: Nasopharyngeal Swab  Result Value Ref Range Status   SARS Coronavirus 2 by RT PCR NEGATIVE NEGATIVE Final    Comment: (NOTE) SARS-CoV-2 target nucleic acids are NOT DETECTED. The SARS-CoV-2 RNA is generally detectable in upper respiratoy specimens during the acute phase of infection. The lowest concentration of SARS-CoV-2 viral copies this assay can detect is 131 copies/mL. A negative result does not preclude SARS-Cov-2 infection and should not be used as the sole basis for treatment or other patient management decisions. A negative result may occur with  improper specimen collection/handling, submission of specimen other than nasopharyngeal swab, presence of viral mutation(s) within the areas targeted by this assay, and inadequate number of viral copies (<131 copies/mL). A negative result must be combined with clinical observations, patient history, and epidemiological information. The expected result is Negative. Fact Sheet for Patients:  PinkCheek.be Fact Sheet for Healthcare Providers:  GravelBags.it This test is not yet ap proved or cleared by the [place] FDA and  has been authorized for detection and/or diagnosis of SARS-CoV-2 by FDA under an Emergency Use Authorization (EUA). This EUA will remain  in effect (meaning this test can be used) for the duration of the COVID-19 declaration under Section 564(b)(1) of the Act, 21 U.S.C. section 360bbb-3(b)(1), unless the authorization is terminated or revoked sooner.    Influenza A by PCR NEGATIVE NEGATIVE Final   Influenza B by PCR NEGATIVE NEGATIVE Final    Comment: (NOTE) The Xpert Xpress  SARS-CoV-2/FLU/RSV assay is intended as an aid in  the diagnosis of influenza from Nasopharyngeal swab specimens and  should not be used as a sole basis for treatment. Nasal washings and  aspirates are unacceptable for Xpert Xpress SARS-CoV-2/FLU/RSV  testing. Fact Sheet for Patients: PinkCheek.be Fact Sheet for Healthcare Providers: GravelBags.it This test is not yet approved or cleared by the [place] FDA and  has been authorized for detection and/or diagnosis of SARS-CoV-2 by  FDA under an Emergency Use Authorization (EUA). This EUA will remain  in effect (meaning this test can be used) for the duration of the  Covid-19 declaration under Section 564(b)(1) of the Act, 21  U.S.C. section 360bbb-3(b)(1), unless the authorization is  terminated or revoked. Performed at [facility], [address] [place], [place] [address]          Radiology Studies: No results found.  Scheduled Meds: . cyanocobalamin  1,000 mcg Intramuscular Q24H  . gabapentin  300 mg Oral TID  . pantoprazole (PROTONIX) IV  40 mg Intravenous Q12H  . pravastatin  40 mg Oral Daily  . topiramate  200 mg Oral BID   Continuous Infusions: . dextrose 5 % and 0.9% NaCl 100 mL/hr at 10/13/19 1358    Assessment & Plan:   Principal Problem:   Acute GI bleeding Active Problems:   Hypotension   AKI (acute kidney injury) ([place])   Syncope and collapse   Type 2 diabetes mellitus (HCC)   Acute blood loss anemia   Acute gastric ulcer with hemorrhage   Acute gastric ulcer without hemorrhage or perforation   Acute GI bleeding/abdominal pain:  GI consulted-status post upper endoscopy finding with 2 oozing gastric ulcers treated with argon plasma coagulation  H&H dropped late last night status post 1 unit packed red blood cell (for total of 2 packed red blood cells Since hemoglobin dropped a repeat EGD was done for reassessment of ulcer  sites.  3 nonbleeding gastric ulcers were found in the gastric fundus and in the gastric body.  No further interventions were needed due to absence of bleed. We will continue continue IV Protonix  twice daily Avoid NSAIDs except aspirin if medically indicated per GI CTAabd/pelvis did not show any acute abnormality.There is evidence of fatty liver. Continue to check serial H&H transfuse Transfuse if hemoglobin less than 7   Hypotension: Secondary to GI bleed  treat with IV fluids and packed red blood cell .Hold antihypertensives Currently stabilized  S/p syncope: Likely from GI bleeding/hypotension  Acute kidney injury: This is likely prerenal from GI bleeding. Resolved  Leukocytosis: This is likely reactive. Monitor CBC  Mild acute asthma exacerbation: Treat with bronchodilators.  Resolved  Mild lactic acidosis: This is likely due to hypovolemia. Repeat lactate is normal  Type 2 DM: Hold Glipizide Check fingersticks  Chronic neck and low back pain/sciatica:Degenerative changes of lumbar spine noted on CT abd/pelvis.Continue Gabapentin.She said she does not takeany NSAIDs.  Seizure disorder: Continue Topamax   DVT prophylaxis: SCD Code Status: Full Family Communication: None at bedside Disposition Plan: Possibly DC in 1 if h/h stable, no further bleed, and cleared by GI.  LOS: 2 days   Time spent: 45 minutes with more than 50% on Talent, MD [facility] Pager [phone]  If 7PM-7AM, please contact night-coverage [email or web address] Password [facility] 10/13/2019, 4:22 PM

## 2019-10-13 NOTE — Op Note (Signed)
[facility] Gastroenterology Patient Name: Jaime Fitzgerald Procedure Date: 10/13/2019 11:00 AM MRN: [id number] Account #: [id number] Date of Birth: 1961-01-02 Admit Type: Inpatient Age: 59 Room: [facility] ENDO ROOM 3 Gender: Female Note Status: Finalized Procedure:             Upper GI endoscopy Indications:           Iron deficiency anemia Providers:             Gavino Fouch B. Bonna Gains MD, MD Medicines:             Monitored Anesthesia Care Complications:         No immediate complications. Procedure:             Pre-Anesthesia Assessment:                        - Prior to the procedure, a History and Physical was                         performed, and patient medications, allergies and                         sensitivities were reviewed. The patient's tolerance                         of previous anesthesia was reviewed.                        - The risks and benefits of the procedure and the                         sedation options and risks were discussed with the                         patient. All questions were answered and informed                         consent was obtained.                        - Patient identification and proposed procedure were                         verified prior to the procedure by the physician, the                         nurse, the anesthesiologist, the anesthetist and the                         technician. The procedure was verified in the                         procedure room.                        - ASA Grade Assessment: II - A patient with mild                         systemic disease.  After obtaining informed consent, the endoscope was                         passed under direct vision. Throughout the procedure,                         the patient's blood pressure, pulse, and oxygen                         saturations were monitored continuously. The Endoscope                         was introduced  through the mouth, and advanced to the                         second part of duodenum. The upper GI endoscopy was                         accomplished with ease. The patient tolerated the                         procedure well. Findings:      The examined esophagus was normal.      Three non-bleeding gastric ulcers were found in the gastric fundus and       in the gastric body. The largest lesion was 8 mm in largest dimension. 2       of the ulcers were clean based. 1 one of them was the one previously       treated with APC this week and did not have any further bleeding. These       did not need any further interventions due to the absence of bleeding on       this procedure.      The duodenal bulb, second portion of the duodenum and examined duodenum       were normal. Impression:            - Normal esophagus.                        - Non-bleeding gastric ulcers.                        - Normal duodenal bulb, second portion of the duodenum                         and examined duodenum.                        - No specimens collected. Recommendation:        - Use Protonix (pantoprazole) 40 mg IV BID.                        - Avoid NSAIDs except Aspirin if medically indicated                         by PCP                        - Repeat upper endoscopy in 2 months to check healing.                        -  Continue Serial CBCs and transfuse PRN                        - Soft diet.                        - Continue present medications.                        - Patient has a contact number available for                         emergencies. The signs and symptoms of potential                         delayed complications were discussed with the patient.                         Return to normal activities tomorrow. Written                         discharge instructions were provided to the patient.                        - The findings and recommendations were discussed with                          the patient.                        - Return to GI clinic in 2 months.                        - Return to primary care physician in 2 weeks. Procedure Code(s):     --- Professional ---                        [phone], Esophagogastroduodenoscopy, flexible,                         transoral; diagnostic, including collection of                         specimen(s) by brushing or washing, when performed                         (separate procedure) Diagnosis Code(s):     --- Professional ---                        K25.9, Gastric ulcer, unspecified as acute or chronic,                         without hemorrhage or perforation                        D50.9, Iron deficiency anemia, unspecified CPT copyright 2019 American Medical Association. All rights reserved. The codes documented in this report are preliminary and upon coder review may  be revised to meet current compliance requirements.  Vonda Antigua, MD Margretta Sidle B. Bonna Gains MD, MD 10/13/2019 11:55:31 AM This report has been signed electronically. Number  of Addenda: 0 Note Initiated On: 10/13/2019 11:00 AM Estimated Blood Loss:  Estimated blood loss: none.      [facility]

## 2019-10-13 NOTE — Anesthesia Procedure Notes (Signed)
Date/Time: 10/13/2019 11:40 AM Performed by: Nelda Marseille, CRNA Pre-anesthesia Checklist: Patient identified, Emergency Drugs available, Suction available, Patient being monitored and Timeout performed Oxygen Delivery Method: Nasal cannula

## 2019-10-13 NOTE — Anesthesia Postprocedure Evaluation (Signed)
Anesthesia Post Note  Patient: Jaime Fitzgerald  Procedure(s) Performed: ESOPHAGOGASTRODUODENOSCOPY (EGD) (N/A )  Patient location during evaluation: Endoscopy Anesthesia Type: General Level of consciousness: awake and alert and oriented Pain management: pain level controlled Vital Signs Assessment: post-procedure vital signs reviewed and stable Respiratory status: spontaneous breathing, nonlabored ventilation and respiratory function stable Cardiovascular status: blood pressure returned to baseline and stable Postop Assessment: no signs of nausea or vomiting Anesthetic complications: no     Last Vitals:  Vitals:   10/13/19 1220 10/13/19 1329  BP:  138/85  Pulse: 83 85  Resp: 16 18  Temp:  36.8 C  SpO2: 99% 100%    Last Pain:  Vitals:   10/13/19 1329  TempSrc:   PainSc: 0-No pain                 Bhakti Labella

## 2019-10-14 DIAGNOSIS — D62 Acute posthemorrhagic anemia: Secondary | ICD-10-CM

## 2019-10-14 DIAGNOSIS — K250 Acute gastric ulcer with hemorrhage: Principal | ICD-10-CM

## 2019-10-14 LAB — CBC
HCT: 29.9 % — ABNORMAL LOW (ref 36.0–46.0)
Hemoglobin: 9.5 g/dL — ABNORMAL LOW (ref 12.0–15.0)
MCH: 31.6 pg (ref 26.0–34.0)
MCHC: 31.8 g/dL (ref 30.0–36.0)
MCV: 99.3 fL (ref 80.0–100.0)
Platelets: 170 10*3/uL (ref 150–400)
RBC: 3.01 MIL/uL — ABNORMAL LOW (ref 3.87–5.11)
RDW: 18 % — ABNORMAL HIGH (ref 11.5–15.5)
WBC: 10.2 10*3/uL (ref 4.0–10.5)
nRBC: 0 % (ref 0.0–0.2)

## 2019-10-14 LAB — GLUCOSE, CAPILLARY
Glucose-Capillary: 132 mg/dL — ABNORMAL HIGH (ref 70–99)
Glucose-Capillary: 140 mg/dL — ABNORMAL HIGH (ref 70–99)

## 2019-10-14 LAB — PREPARE RBC (CROSSMATCH)

## 2019-10-14 MED ORDER — MECLIZINE HCL 12.5 MG PO TABS
12.5000 mg | ORAL_TABLET | Freq: Three times a day (TID) | ORAL | Status: DC | PRN
  Administered 2019-10-14: 12.5 mg via ORAL
  Filled 2019-10-14 (×2): qty 1

## 2019-10-14 MED ORDER — ALBUTEROL SULFATE HFA 108 (90 BASE) MCG/ACT IN AERS: 2.0000 | INHALATION_SPRAY | Freq: Four times a day (QID) | RESPIRATORY_TRACT | Status: AC | PRN

## 2019-10-14 MED ORDER — PANTOPRAZOLE SODIUM 40 MG PO TBEC: 40.0000 mg | DELAYED_RELEASE_TABLET | Freq: Two times a day (BID) | ORAL | 0 refills | Status: DC

## 2019-10-14 MED ORDER — PANTOPRAZOLE SODIUM 40 MG PO TBEC: 40.0000 mg | DELAYED_RELEASE_TABLET | Freq: Two times a day (BID) | ORAL | Status: DC

## 2019-10-14 MED ORDER — VITAMIN B-12 1000 MCG PO TABS: 1000.0000 ug | ORAL_TABLET | Freq: Every day | ORAL | 0 refills | Status: DC

## 2019-10-14 MED ORDER — MECLIZINE HCL 12.5 MG PO TABS: 12.5000 mg | ORAL_TABLET | Freq: Three times a day (TID) | ORAL | 0 refills | Status: DC | PRN

## 2019-10-14 NOTE — Progress Notes (Signed)
Went to give patient dc instructions.  Pt states that her head is spinning.  Spoke with nurse - she stated pt has antivert on profile.  Will give that. And nurse will reeval in an hours for possible dc.

## 2019-10-14 NOTE — Discharge Summary (Addendum)
Jaime Fitzgerald [id number] DOB: 27-Dec-1960 DOA: 10/11/2019  PCP: [facility] date: 10/11/2019 Discharge date: 10/14/2019  Admitted From: Home Disposition: Home  Recommendations for Outpatient Follow-up:  1. Follow up with PCP in 1 week 2. Please obtain BMP/CBC in one week 3. Please follow up on the following pending results: None 4. Dr. Bonna Gains gastroenterology in 1 month  Home Health: None   Discharge Condition:Stable CODE STATUS: Full Diet recommendation: Carb Modified  Brief/Interim Summary: Jaime Fitzgerald is an 59 y.o. 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  She said she severe abdominal pain last night and she took Alka-Seltzer to relieve her pain.    On the day of admission she vomited blood and also passed black stools.    And with hemoglobin of 6.9.  GI was consulted.  She did receive 2 units of packed red blood cell.  Serial H&H were monitored.  She underwent for EGD revealing red blood in the entire stomach, oozing gastric ulcers, treated with argon plasma coagulation, normal second portion of duodenum.  She was started on IV Protonix twice daily.  She continued noticing blood in stool and she underwent a second EGD on 10/13/2019 revealing normal esophagus, nonbleeding gastric ulcers.  Normal duodenal bulb, second portion of duodenum and examined duodenum.  She has had no further episodes of bleed.  H&H today is stable with hemoglobin 9.5 and hematocrit of 29.9.  She is feeling better.  She is stable from GI standpoint being discharged home. Given her meclizine for her vertigo as she gets vertigo often. Discharge Diagnoses:  Principal Problem:   Acute GI bleeding Active Problems:   Hypotension   AKI (acute kidney injury) ([place])   Syncope and collapse   Type 2 diabetes mellitus  (HCC)   Acute blood loss anemia   Acute gastric ulcer with hemorrhage   Acute gastric ulcer without hemorrhage or perforation    Discharge Instructions  Discharge Instructions    Call MD for:  difficulty breathing, headache or visual disturbances   Complete by: As directed    Call MD for:  difficulty breathing, headache or visual disturbances   Complete by: As directed    Diet - low sodium heart healthy   Complete by: As directed    Discharge instructions   Complete by: As directed    Follow up with Dr. Bonna Gains GI in 2 months F/u with pcp in 2 weeks   Discharge instructions   Complete by: As directed    Follow-up gastroenterology Dr. Bonna Gains in 4 weeks Follow-up primary care in 1 week Avoid NSAIDs   Increase activity slowly   Complete by: As directed    Increase activity slowly   Complete by: As directed      Allergies as of 10/14/2019      Reactions   Aspirin Nausea Only, Other (See Comments)   GI upset   Penicillins Rash      Medication List    STOP taking these medications   cetirizine 10 MG tablet Commonly known as: ZYRTEC     TAKE these medications   albuterol 108 (90 Base) MCG/ACT inhaler Commonly known as: VENTOLIN HFA Inhale 2 puffs into the lungs every 6 (six) hours as needed for wheezing or shortness of breath.   cloNIDine 0.1 MG tablet Commonly known as: CATAPRES Take 0.1 mg by mouth  2 (two) times daily.   gabapentin 300 MG capsule Commonly known as: NEURONTIN Take 300 mg by mouth 3 (three) times daily.   glipiZIDE 10 MG 24 hr tablet Commonly known as: GLUCOTROL XL Take 10 mg by mouth 2 (two) times daily.   meclizine 12.5 MG tablet Commonly known as: ANTIVERT Take 1 tablet (12.5 mg total) by mouth 3 (three) times daily as needed for dizziness.   pantoprazole 40 MG tablet Commonly known as: PROTONIX Take 1 tablet (40 mg total) by mouth 2 (two) times daily.   pravastatin 40 MG tablet Commonly known as: PRAVACHOL Take 40 mg by mouth  daily.   topiramate 100 MG tablet Commonly known as: TOPAMAX Take 200 mg by mouth 2 (two) times daily.   vitamin B-12 1000 MCG tablet Commonly known as: CYANOCOBALAMIN Take 1 tablet (1,000 mcg total) by mouth daily.      Follow-up Information    Virgel Manifold, MD Follow up in 2 month(s).   Specialty: Gastroenterology Why: Call next week to make appointment Contact information: [place] [address] [place], [place] Follow up in 1 week(s).   Specialty: General Practice Why: Call Monday to make appointment Contact information: [address] [place]. [place] [address] [phone]          Allergies  Allergen Reactions  . Aspirin Nausea Only and Other (See Comments)    GI upset  . Penicillins Rash    Consultations:  GI   Procedures/Studies: CT Angio Abd/Pel w/ and/or w/o  Result Date: 10/11/2019 CLINICAL DATA:  Abdominal pain, GI bleed EXAM: CT ANGIOGRAPHY ABDOMEN AND PELVIS WITH CONTRAST AND WITHOUT CONTRAST TECHNIQUE: Multidetector CT imaging of the abdomen and pelvis was performed using the standard protocol during bolus administration of intravenous contrast. Multiplanar reconstructed images and MIPs were obtained and reviewed to evaluate the vascular anatomy. CONTRAST:  62mL OMNIPAQUE IOHEXOL 350 MG/ML SOLN COMPARISON:  11/04/2016 FINDINGS: VASCULAR Aorta: minor aortic atherosclerosis without aneurysm, dissection, occlusive process, evidence of rupture, or retroperitoneal hematoma. Celiac: Widely patent origin including its branches. No active arterial bleeding in the celiac distribution. SMA: Widely patent origin including its branches. No active arterial bleeding in the SMA distribution. Renals: Atherosclerotic origins with heavy calcification at the left renal ostium. Difficult to exclude left renal artery stenosis. Right renal artery appears patent. No accessory renal artery. IMA:  Patent origin off the distal aorta. Distal branches are not opacified. No active arterial bleeding in the IMA territory appreciated. Inflow: Common, internal and external iliac arteries all remain patent. Minor atherosclerosis and tortuosity without occlusive process or inflow disease. Proximal Outflow: Visualized common femoral, proximal profunda femoral, proximal superficial femoral arteries are all patent. Veins: No veno-occlusive process. Review of the MIP images confirms the above findings. NON-VASCULAR Lower chest: Dependent bibasilar atelectasis. Chronic inferior lingula and right middle lobe scarring. No acute lower chest process. Native coronary atherosclerosis noted. Normal heart size. No pericardial or pleural effusion. Hepatobiliary: Focal fatty infiltration of the liver along the falciform ligament anteriorly in the left hepatic lobe, image 23 series 9. No other hepatic abnormality or intrahepatic biliary dilatation. Hepatic and portal veins are patent. Gallbladder and biliary system unremarkable. Common bile duct nondilated. Pancreas: Unremarkable. No pancreatic ductal dilatation or surrounding inflammatory changes. Spleen: Normal in size without focal abnormality. Adrenals/Urinary Tract: Normal adrenal glands. Kidneys demonstrate no acute process or hydronephrosis. No focal abnormality. Ureters are symmetric and decompressed. No obstructing  ureteral calculus. No UVJ abnormality. Bladder unremarkable. Stomach/Bowel: Negative for bowel obstruction, significant dilatation, ileus, or free air. Normal appendix. Colonic diverticulosis most pronounced in the sigmoid without acute inflammatory process. No fluid collection, hemorrhage, hematoma, abscess, ascites or adenopathy. Lymphatic: No bulky adenopathy. Reproductive: Status post hysterectomy. No adnexal masses. Other: No abdominal wall hernia or abnormality. No abdominopelvic ascites. Musculoskeletal: Degenerative changes noted of the spine. Lower lumbar  facet arthropathy. No acute osseous finding. IMPRESSION: VASCULAR Abdominal atherosclerosis as detailed above without active arterial bleeding or other acute vascular finding. NON-VASCULAR No other acute intra-abdominal or pelvic finding by CTA. Colonic diverticulosis without acute inflammatory process Electronically Signed   By: Jerilynn Mages.  Shick M.D.   On: 10/11/2019 09:48      Subjective: C/o of vertigo as she used to have this in the past.  Nausea, vomiting.  No further episodes of bleeding in the stool.  Abdominal pain has minimized. Discharge Exam: Vitals:   10/14/19 0809 10/14/19 1314  BP: 135/80 (!) 160/72  Pulse: 70 89  Resp: 16 18  Temp: 98.4 F (36.9 C) 98.5 F (36.9 C)  SpO2: 97% 95%   Vitals:   10/13/19 1930 10/14/19 0545 10/14/19 0809 10/14/19 1314  BP: 137/75 (!) 161/79 135/80 (!) 160/72  Pulse: 88 75 70 89  Resp:   16 18  Temp: 98.1 F (36.7 C) 97.9 F (36.6 C) 98.4 F (36.9 C) 98.5 F (36.9 C)  TempSrc: Oral Oral Oral Oral  SpO2: 100% 99% 97% 95%  Weight:      Height:        General: Pt is alert, awake, not in acute distress Cardiovascular: RRR, S1/S2 +, no rubs, no gallops Respiratory: CTA bilaterally, no wheezing, no rhonchi Abdominal: Soft, NT, ND, bowel sounds + Extremities: no edema, no cyanosis    The results of significant diagnostics from this hospitalization (including imaging, microbiology, ancillary and laboratory) are listed below for reference.     Microbiology: Recent Results (from the past 240 hour(s))  Respiratory Panel by RT PCR (Flu A&B, Covid) - Nasopharyngeal Swab     Status: None   Collection Time: 10/11/19  6:23 AM   Specimen: Nasopharyngeal Swab  Result Value Ref Range Status   SARS Coronavirus 2 by RT PCR NEGATIVE NEGATIVE Final    Comment: (NOTE) SARS-CoV-2 target nucleic acids are NOT DETECTED. The SARS-CoV-2 RNA is generally detectable in upper respiratoy specimens during the acute phase of infection. The lowest concentration  of SARS-CoV-2 viral copies this assay can detect is 131 copies/mL. A negative result does not preclude SARS-Cov-2 infection and should not be used as the sole basis for treatment or other patient management decisions. A negative result may occur with  improper specimen collection/handling, submission of specimen other than nasopharyngeal swab, presence of viral mutation(s) within the areas targeted by this assay, and inadequate number of viral copies (<131 copies/mL). A negative result must be combined with clinical observations, patient history, and epidemiological information. The expected result is Negative. Fact Sheet for Patients:  PinkCheek.be Fact Sheet for Healthcare Providers:  GravelBags.it This test is not yet ap proved or cleared by the [place] FDA and  has been authorized for detection and/or diagnosis of SARS-CoV-2 by FDA under an Emergency Use Authorization (EUA). This EUA will remain  in effect (meaning this test can be used) for the duration of the COVID-19 declaration under Section 564(b)(1) of the Act, 21 U.S.C. section 360bbb-3(b)(1), unless the authorization is terminated or revoked sooner.    Influenza  A by PCR NEGATIVE NEGATIVE Final   Influenza B by PCR NEGATIVE NEGATIVE Final    Comment: (NOTE) The Xpert Xpress SARS-CoV-2/FLU/RSV assay is intended as an aid in  the diagnosis of influenza from Nasopharyngeal swab specimens and  should not be used as a sole basis for treatment. Nasal washings and  aspirates are unacceptable for Xpert Xpress SARS-CoV-2/FLU/RSV  testing. Fact Sheet for Patients: PinkCheek.be Fact Sheet for Healthcare Providers: GravelBags.it This test is not yet approved or cleared by the [place] FDA and  has been authorized for detection and/or diagnosis of SARS-CoV-2 by  FDA under an Emergency Use Authorization (EUA).  This EUA will remain  in effect (meaning this test can be used) for the duration of the  Covid-19 declaration under Section 564(b)(1) of the Act, 21  U.S.C. section 360bbb-3(b)(1), unless the authorization is  terminated or revoked. Performed at [facility], [address] [place], [place] [address]      Labs: BNP (last 3 results) No results for input(s): BNP in the last 8760 hours. Basic Metabolic Panel: Recent Labs  Lab 10/11/19 0623 10/12/19 0611  NA 140 143  K 3.6 3.9  CL 102 109  CO2 28 27  GLUCOSE 140* 174*  BUN 50* 24*  CREATININE 1.45* 0.99  CALCIUM 8.5* 7.6*   Liver Function Tests: Recent Labs  Lab 10/11/19 0623  AST 28  ALT 14  ALKPHOS 45  BILITOT 0.5  PROT 5.8*  ALBUMIN 3.3*   Recent Labs  Lab 10/11/19 0623  LIPASE 31   No results for input(s): AMMONIA in the last 168 hours. CBC: Recent Labs  Lab 10/11/19 0623 10/11/19 1755 10/12/19 0611 10/12/19 1552 10/12/19 2247 10/13/19 0643 10/13/19 1942 10/14/19 0938  WBC 16.9* 18.2* 13.5*  --   --  9.4  --  10.2  NEUTROABS 13.0*  --   --   --   --   --   --   --   HGB 8.6* 9.5* 7.9* 7.4* 6.9* 8.1* 8.7* 9.5*  HCT 26.5* 29.7* 24.9* 23.1* 21.4* 26.4* 27.7* 29.9*  MCV 105.2* 104.2* 103.8*  --   --  100.0  --  99.3  PLT 210 163 150  --   --  134*  --  170   Cardiac Enzymes: No results for input(s): CKTOTAL, CKMB, CKMBINDEX, TROPONINI in the last 168 hours. BNP: Invalid input(s): POCBNP CBG: Recent Labs  Lab 10/13/19 1106 10/13/19 1704 10/13/19 2031 10/14/19 0812 10/14/19 1110  GLUCAP 167* 133* 190* 132* 140*   D-Dimer No results for input(s): DDIMER in the last 72 hours. Hgb A1c No results for input(s): HGBA1C in the last 72 hours. Lipid Profile No results for input(s): CHOL, HDL, LDLCALC, TRIG, CHOLHDL, LDLDIRECT in the last 72 hours. Thyroid function studies No results for input(s): TSH, T4TOTAL, T3FREE, THYROIDAB in the last 72 hours.  Invalid input(s): FREET3 Anemia  work up No results for input(s): VITAMINB12, FOLATE, FERRITIN, TIBC, IRON, RETICCTPCT in the last 72 hours. Urinalysis    Component Value Date/Time   COLORURINE YELLOW 10/26/2010 1514   APPEARANCEUR CLOUDY (A) 10/26/2010 1514   LABSPEC 1.020 10/26/2010 1514   PHURINE 5.5 10/26/2010 1514   HGBUR SMALL (A) 10/26/2010 [place] 10/26/2010 1514   KETONESUR NEGATIVE 10/26/2010 1514   PROTEINUR NEGATIVE 10/26/2010 1514   UROBILINOGEN 0.2 10/26/2010 1514   NITRITE NEGATIVE 10/26/2010 1514   LEUKOCYTESUR NEGATIVE 10/26/2010 1514   Sepsis Labs Invalid input(s): PROCALCITONIN,  WBC,  LACTICIDVEN  Microbiology Recent Results (from the past 240 hour(s))  Respiratory Panel by RT PCR (Flu A&B, Covid) - Nasopharyngeal Swab     Status: None   Collection Time: 10/11/19  6:23 AM   Specimen: Nasopharyngeal Swab  Result Value Ref Range Status   SARS Coronavirus 2 by RT PCR NEGATIVE NEGATIVE Final    Comment: (NOTE) SARS-CoV-2 target nucleic acids are NOT DETECTED. The SARS-CoV-2 RNA is generally detectable in upper respiratoy specimens during the acute phase of infection. The lowest concentration of SARS-CoV-2 viral copies this assay can detect is 131 copies/mL. A negative result does not preclude SARS-Cov-2 infection and should not be used as the sole basis for treatment or other patient management decisions. A negative result may occur with  improper specimen collection/handling, submission of specimen other than nasopharyngeal swab, presence of viral mutation(s) within the areas targeted by this assay, and inadequate number of viral copies (<131 copies/mL). A negative result must be combined with clinical observations, patient history, and epidemiological information. The expected result is Negative. Fact Sheet for Patients:  PinkCheek.be Fact Sheet for Healthcare Providers:  GravelBags.it This test is not yet ap  proved or cleared by the [place] FDA and  has been authorized for detection and/or diagnosis of SARS-CoV-2 by FDA under an Emergency Use Authorization (EUA). This EUA will remain  in effect (meaning this test can be used) for the duration of the COVID-19 declaration under Section 564(b)(1) of the Act, 21 U.S.C. section 360bbb-3(b)(1), unless the authorization is terminated or revoked sooner.    Influenza A by PCR NEGATIVE NEGATIVE Final   Influenza B by PCR NEGATIVE NEGATIVE Final    Comment: (NOTE) The Xpert Xpress SARS-CoV-2/FLU/RSV assay is intended as an aid in  the diagnosis of influenza from Nasopharyngeal swab specimens and  should not be used as a sole basis for treatment. Nasal washings and  aspirates are unacceptable for Xpert Xpress SARS-CoV-2/FLU/RSV  testing. Fact Sheet for Patients: PinkCheek.be Fact Sheet for Healthcare Providers: GravelBags.it This test is not yet approved or cleared by the [place] FDA and  has been authorized for detection and/or diagnosis of SARS-CoV-2 by  FDA under an Emergency Use Authorization (EUA). This EUA will remain  in effect (meaning this test can be used) for the duration of the  Covid-19 declaration under Section 564(b)(1) of the Act, 21  U.S.C. section 360bbb-3(b)(1), unless the authorization is  terminated or revoked. Performed at [facility], [address] [place], [place] [address]    Acute GI bleeding/abdominal pain: s/p2 units prbc transfusion. See egd findings above.  Hypotension:Secondary to GI bleed treat with IV fluidsand packed red blood cell Held antihypertensives, now bp up can resume   S/p syncope: Likely from GI bleeding/hypotension  Acute kidney injury: This is likely prerenal from GI bleeding. Resolved  Leukocytosis: This is likely reactive. Monitor CBC  Mild acute asthma exacerbation: Treat with  bronchodilators. Resolved  Mild lactic acidosis: This is likely due to hypovolemia. Repeat lactateis normal  Type 2 DM: Hold Glipizide Check fingersticks  Chronic neck and low back pain/sciatica:Degenerative changes of lumbar spine noted on CT abd/pelvis.Continue Gabapentin.She said she does not takeany NSAIDs.  Seizure disorder: Continue Topamax  Time coordinating discharge: Over 30 minutes  SIGNED:   Nolberto Hanlon, MD  [facility] 10/14/2019, 1:20 PM Pager   If 7PM-7AM, please contact night-coverage [email or web address] Password [email or web address]

## 2019-10-14 NOTE — Progress Notes (Signed)
Notified Dr Kurtis Bushman that antivert given for dizziness with improvement and that pt requested Rx for antivert.  MD to place orders.

## 2019-10-14 NOTE — Progress Notes (Signed)
Called Dr Kurtis Bushman to check if pt needs to f/u with GI in 1xmonth or 2 months. Per MD pt to f/u with GI in 1x month.     Pt is being discharged home.  Discharge papers given and explained to pt.  Pt verbalized understanding. Meds and f/u appointments reviewed with pt. Rx sent electronically to pharmacy.  Pt made aware.  Work note given. Awaiting transportation.

## 2019-10-14 NOTE — Progress Notes (Signed)
Jaime Darby, MD [address]  [place]  [place], [place] [address]  Main: [phone]  Fax: [phone] Pager: [phone]   Subjective:  Hemoglobin is stable.  No episodes of melena, abdominal pain.  Patient decided to be discharged home today.  She has been receiving B12 injections.  She underwent EGD which revealed clean-based gastric ulcers only with no evidence of active GI bleed  Objective: Vital signs in last 24 hours: Vitals:   10/13/19 1930 10/14/19 0545 10/14/19 0809 10/14/19 1314  BP: 137/75 (!) 161/79 135/80 (!) 160/72  Pulse: 88 75 70 89  Resp:   16 18  Temp: 98.1 F (36.7 C) 97.9 F (36.6 C) 98.4 F (36.9 C) 98.5 F (36.9 C)  TempSrc: Oral Oral Oral Oral  SpO2: 100% 99% 97% 95%  Weight:      Height:       Weight change:   Intake/Output Summary (Last 24 hours) at 10/14/2019 1517 Last data filed at 10/14/2019 1254 Gross per 24 hour  Intake 2081.63 ml  Output 2300 ml  Net -218.37 ml     Exam: Heart:: Regular rate and rhythm, S1S2 present or without murmur or extra heart sounds Lungs: normal, clear to auscultation and clear to auscultation and percussion Abdomen: soft, nontender, normal bowel sounds   Lab Results: CBC Latest Ref Rng & Units 10/14/2019 10/13/2019 10/13/2019  WBC 4.0 - 10.5 K/uL 10.2 - 9.4  Hemoglobin 12.0 - 15.0 g/dL 9.5(L) 8.7(L) 8.1(L)  Hematocrit 36.0 - 46.0 % 29.9(L) 27.7(L) 26.4(L)  Platelets 150 - 400 K/uL 170 - 134(L)   CMP Latest Ref Rng & Units 10/12/2019 10/11/2019 04/18/2019  Glucose 70 - 99 mg/dL 174(H) 140(H) 100(H)  BUN 6 - 20 mg/dL 24(H) 50(H) 15  Creatinine 0.44 - 1.00 mg/dL 0.99 1.45(H) 1.04(H)  Sodium 135 - 145 mmol/L 143 140 140  Potassium 3.5 - 5.1 mmol/L 3.9 3.6 4.1  Chloride 98 - 111 mmol/L 109 102 106  CO2 22 - 32 mmol/L 27 28 26   Calcium 8.9 - 10.3 mg/dL 7.6(L) 8.5(L) 10.1  Total Protein 6.5 - 8.1 g/dL - 5.8(L) 7.2  Total Bilirubin 0.3 - 1.2 mg/dL - 0.5 0.4  Alkaline Phos 38 - 126 U/L - 45 63  AST 15  - 41 U/L - 28 20  ALT 0 - 44 U/L - 14 13    Micro Results: Recent Results (from the past 240 hour(s))  Respiratory Panel by RT PCR (Flu A&B, Covid) - Nasopharyngeal Swab     Status: None   Collection Time: 10/11/19  6:23 AM   Specimen: Nasopharyngeal Swab  Result Value Ref Range Status   SARS Coronavirus 2 by RT PCR NEGATIVE NEGATIVE Final    Comment: (NOTE) SARS-CoV-2 target nucleic acids are NOT DETECTED. The SARS-CoV-2 RNA is generally detectable in upper respiratoy specimens during the acute phase of infection. The lowest concentration of SARS-CoV-2 viral copies this assay can detect is 131 copies/mL. A negative result does not preclude SARS-Cov-2 infection and should not be used as the sole basis for treatment or other patient management decisions. A negative result may occur with  improper specimen collection/handling, submission of specimen other than nasopharyngeal swab, presence of viral mutation(s) within the areas targeted by this assay, and inadequate number of viral copies (<131 copies/mL). A negative result must be combined with clinical observations, patient history, and epidemiological information. The expected result is Negative. Fact Sheet for Patients:  PinkCheek.be Fact Sheet for Healthcare Providers:  GravelBags.it This  test is not yet ap proved or cleared by the [place] and  has been authorized for detection and/or diagnosis of SARS-CoV-2 by FDA under an Emergency Use Authorization (EUA). This EUA will remain  in effect (meaning this test can be used) for the duration of the COVID-19 declaration under Section 564(b)(1) of the Act, 21 U.S.C. section 360bbb-3(b)(1), unless the authorization is terminated or revoked sooner.    Influenza A by PCR NEGATIVE NEGATIVE Final   Influenza B by PCR NEGATIVE NEGATIVE Final    Comment: (NOTE) The Xpert Xpress SARS-CoV-2/FLU/RSV assay is intended as an  aid in  the diagnosis of influenza from Nasopharyngeal swab specimens and  should not be used as a sole basis for treatment. Nasal washings and  aspirates are unacceptable for Xpert Xpress SARS-CoV-2/FLU/RSV  testing. Fact Sheet for Patients: PinkCheek.be Fact Sheet for Healthcare Providers: GravelBags.it This test is not yet approved or cleared by the [place] FDA and  has been authorized for detection and/or diagnosis of SARS-CoV-2 by  FDA under an Emergency Use Authorization (EUA). This EUA will remain  in effect (meaning this test can be used) for the duration of the  Covid-19 declaration under Section 564(b)(1) of the Act, 21  U.S.C. section 360bbb-3(b)(1), unless the authorization is  terminated or revoked. Performed at [facility], [address], [place], [place] [address]    Studies/Results: No results found. Medications:  I have reviewed the patient's current medications. Prior to Admission:  No medications prior to admission.   Scheduled: . cyanocobalamin  1,000 mcg Intramuscular Q24H  . gabapentin  300 mg Oral TID  . insulin aspart  0-5 Units Subcutaneous QHS  . insulin aspart  0-9 Units Subcutaneous TID WC  . pantoprazole  40 mg Oral BID  . pravastatin  40 mg Oral Daily  . topiramate  200 mg Oral BID   Continuous:  [id number] **OR** acetaminophen, albuterol, meclizine, ondansetron **OR** ondansetron (ZOFRAN) IV, polyethylene glycol Anti-infectives (From admission, onward)   None     Scheduled Meds: . cyanocobalamin  1,000 mcg Intramuscular Q24H  . gabapentin  300 mg Oral TID  . insulin aspart  0-5 Units Subcutaneous QHS  . insulin aspart  0-9 Units Subcutaneous TID WC  . pantoprazole  40 mg Oral BID  . pravastatin  40 mg Oral Daily  . topiramate  200 mg Oral BID   Continuous Infusions: PRN Meds:.acetaminophen **OR** acetaminophen, albuterol, meclizine, ondansetron **OR**  ondansetron (ZOFRAN) IV, polyethylene glycol   Assessment: Principal Problem:   Acute GI bleeding Active Problems:   Hypotension   AKI (acute kidney injury) ([place])   Syncope and collapse   Type 2 diabetes mellitus (HCC)   Acute blood loss anemia   Acute gastric ulcer with hemorrhage   Acute gastric ulcer without hemorrhage or perforation    Plan: Continue pantoprazole 40 mg p.o. twice daily before meals at least for 3 months Check H. pylori IgG and treat if positive Recommend repeat EGD in 3 months to confirm healing of the gastric ulcers Follow-up with GI in 1 month Continue oral iron and B12 as outpatient Okay to discharge home today from GI standpoint   LOS: 3 days   Meenakshi Sazama 10/14/2019, 3:17 PM

## 2019-10-18 ENCOUNTER — Telehealth: Payer: Self-pay | Admitting: Gastroenterology

## 2019-10-18 LAB — TYPE AND SCREEN
ABO/RH(D): O POS
Antibody Screen: NEGATIVE
Unit division: 0
Unit division: 0
Unit division: 0

## 2019-10-18 LAB — BPAM RBC
Blood Product Expiration Date: 202102022359
Blood Product Expiration Date: 202102082359
Blood Product Expiration Date: 202102082359
ISSUE DATE / TIME: 202101060646
ISSUE DATE / TIME: 202101080244
ISSUE DATE / TIME: 202101091601
Unit Type and Rh: 5100
Unit Type and Rh: 5100
Unit Type and Rh: 5100

## 2019-10-18 NOTE — Telephone Encounter (Signed)
Left vm to offer f/u apt with Dr. Marius Ditch

## 2019-10-18 NOTE — Telephone Encounter (Signed)
-----   Message from Lin Landsman, MD sent at 10/14/2019  1:01 PM EST ----- Regarding: Hospital f/u, gastric ulcer Follow up with anyone in 62month  RV

## 2019-10-23 ENCOUNTER — Telehealth: Payer: Self-pay | Admitting: Gastroenterology

## 2019-10-23 ENCOUNTER — Encounter: Payer: Self-pay | Admitting: Gastroenterology

## 2019-10-23 NOTE — Telephone Encounter (Signed)
Left  vm to offer ed f/u apt. Letter sent

## 2019-10-30 ENCOUNTER — Telehealth: Payer: Self-pay | Admitting: Gastroenterology

## 2019-10-30 NOTE — Telephone Encounter (Signed)
Pt is scheduled for  Ed f/u apt with Dr. Marius Ditch for 12/11/19 she states she is still having  Acid build up and is having pain please call pt  She needs some advice

## 2019-10-30 NOTE — Telephone Encounter (Signed)
Pt has been schedule for 10/31/2019 for televisit for abd. pain

## 2019-10-31 ENCOUNTER — Encounter: Payer: Self-pay | Admitting: Gastroenterology

## 2019-10-31 ENCOUNTER — Ambulatory Visit (INDEPENDENT_AMBULATORY_CARE_PROVIDER_SITE_OTHER): Payer: Medicaid Other | Admitting: Gastroenterology

## 2019-10-31 DIAGNOSIS — M65849 Other synovitis and tenosynovitis, unspecified hand: Secondary | ICD-10-CM | POA: Insufficient documentation

## 2019-10-31 DIAGNOSIS — M546 Pain in thoracic spine: Secondary | ICD-10-CM | POA: Insufficient documentation

## 2019-10-31 DIAGNOSIS — E8881 Metabolic syndrome: Secondary | ICD-10-CM

## 2019-10-31 DIAGNOSIS — G43909 Migraine, unspecified, not intractable, without status migrainosus: Secondary | ICD-10-CM | POA: Insufficient documentation

## 2019-10-31 DIAGNOSIS — D500 Iron deficiency anemia secondary to blood loss (chronic): Secondary | ICD-10-CM

## 2019-10-31 DIAGNOSIS — K250 Acute gastric ulcer with hemorrhage: Secondary | ICD-10-CM

## 2019-10-31 DIAGNOSIS — R569 Unspecified convulsions: Secondary | ICD-10-CM | POA: Insufficient documentation

## 2019-10-31 DIAGNOSIS — K5790 Diverticulosis of intestine, part unspecified, without perforation or abscess without bleeding: Secondary | ICD-10-CM | POA: Insufficient documentation

## 2019-10-31 DIAGNOSIS — N83209 Unspecified ovarian cyst, unspecified side: Secondary | ICD-10-CM | POA: Insufficient documentation

## 2019-10-31 DIAGNOSIS — K5909 Other constipation: Secondary | ICD-10-CM

## 2019-10-31 DIAGNOSIS — D518 Other vitamin B12 deficiency anemias: Secondary | ICD-10-CM

## 2019-10-31 MED ORDER — VITAMIN B-12 1000 MCG PO TABS: 1000.0000 ug | ORAL_TABLET | Freq: Every day | ORAL | 1 refills | Status: DC

## 2019-10-31 MED ORDER — POLYETHYLENE GLYCOL 3350 17 GM/SCOOP PO POWD: 17.0000 g | Freq: Two times a day (BID) | ORAL | 2 refills | Status: DC

## 2019-10-31 MED ORDER — OMEPRAZOLE 40 MG PO CPDR: 40.0000 mg | DELAYED_RELEASE_CAPSULE | Freq: Two times a day (BID) | ORAL | 2 refills | Status: DC

## 2019-10-31 NOTE — Progress Notes (Signed)
Sherri Sear, MD [address]  [place]  [place], [place] [address]  Main: [phone]  Fax: [phone]    Gastroenterology Consultation Tele Visit  Referring Provider:     [facility] [place] Physician:  [facility], [place] Primary Gastroenterologist:  Dr. Cephas Darby Reason for Consultation: History of peptic ulcer disease, hospital follow-up        HPI:   Jaime Fitzgerald is a 59 y.o. female referred by Dr. Domingo Madeira, [place]  for consultation & management of peptic ulcer disease resulting in iron deficiency anemia and GI bleed.  Virtual Visit via Telephone Note  I connected with Jaime Fitzgerald on 10/31/19 at 10:30 AM EST by telephone and verified that I am speaking with the correct person using two identifiers.   I discussed the limitations, risks, security and privacy concerns of performing an evaluation and management service by telephone and the availability of in person appointments. I also discussed with the patient that there may be a patient responsible charge related to this service. The patient expressed understanding and agreed to proceed.  Location of the Patient: Home  Location of the provider: Office  Persons participating in the visit: Patient and provider, patient's nurse  History of Present Illness: Jaime Fitzgerald was recently admitted to [facility] on 10/11/2019 secondary to severe upper GI bleed with drop in hemoglobin from 14.5 in 7/20 to 6.9 requiring blood transfusion.  She underwent CT angio which revealed mild atherosclerosis, widely patent celiac, SMA and other major arteries.  She underwent EGD which revealed bleeding gastric ulcers that were treated with cautery and APC.  Patient had a repeat EGD 48 hours later which did not show any active bleeding from the previously treated gastric ulcers.  She was found to have severe iron deficiency as well as B12 deficiency she was discharged home on Protonix  40 mg twice daily as well as oral B12 Since discharge, patient denies rectal bleeding, black stools, hematemesis or coffee-ground emesis.  She does report abdominal cramping and has not had a bowel movement for several days.  She acknowledges taking Protonix 40 mg 2 times a day along with B12 daily   She did not come for an in office appointment today due to lack of transportation  NSAIDs: None  Antiplts/Anticoagulants/Anti thrombotics: None  GI Procedures: EGD 10/13/2019 - Normal esophagus. - Non-bleeding gastric ulcers. - Normal duodenal bulb, second portion of the duodenum and examined duodenum. - No specimens collected.  Denies having had a colonoscopy in the past  Past Medical History:  Diagnosis Date  . Asthma   . Diabetes mellitus without complication ([place])   . Hypertension   . Seizures ([place])     Past Surgical History:  Procedure Laterality Date  . CARPAL TUNNEL RELEASE    . ESOPHAGOGASTRODUODENOSCOPY N/A 10/13/2019   Procedure: ESOPHAGOGASTRODUODENOSCOPY (EGD);  Surgeon: Virgel Manifold, MD;  [facility] END[facility]COPY;  Service: Endoscopy;  Laterality: N/A;  . ESOPHAGOGASTRODUODENOSCOPY (EGD) WITH PROPOFOL N/A 10/11/2019   Procedure: ESOPHAGOGASTRODUODENOSCOPY (EGD) WITH PROPOFOL;  Surgeon: Virgel Manifold, MD;  Location: [facility] ENDOSCOPY;  Service: Endoscopy;  Laterality: N/A;  . TONSILLECTOMY      Current Outpatient Medications:  .  albuterol (VENTOLIN HFA) 108 (90 Base) MCG/ACT inhaler, Inhale 2 puffs into the lungs every 6 (six) hours as needed for wheezing or shortness of breath., Disp: , Rfl:  .  cloNIDine (CATAPRES) 0.1 MG tablet, Take 0.1 mg by mouth 2 (  two) times daily., Disp: , Rfl:  .  gabapentin (NEURONTIN) 300 MG capsule, Take 300 mg by mouth 3 (three) times daily., Disp: , Rfl:  .  glipiZIDE (GLUCOTROL XL) 10 MG 24 hr tablet, Take 10 mg by mouth 2 (two) times daily., Disp: , Rfl:  .  meclizine (ANTIVERT) 12.5 MG tablet, Take 1 tablet (12.5 mg total) by  mouth 3 (three) times daily as needed for dizziness., Disp: 14 tablet, Rfl: 0 .  pantoprazole (PROTONIX) 40 MG tablet, Take 1 tablet (40 mg total) by mouth 2 (two) times daily., Disp: 60 tablet, Rfl: 0 .  pravastatin (PRAVACHOL) 40 MG tablet, Take 40 mg by mouth daily., Disp: , Rfl:  .  topiramate (TOPAMAX) 100 MG tablet, Take 200 mg by mouth 2 (two) times daily., Disp: , Rfl:  .  vitamin B-12 (CYANOCOBALAMIN) 1000 MCG tablet, Take 1 tablet (1,000 mcg total) by mouth daily., Disp: 30 tablet, Rfl: 1 .  omeprazole (PRILOSEC) 40 MG capsule, Take 1 capsule (40 mg total) by mouth 2 (two) times daily before a meal., Disp: 60 capsule, Rfl: 2 .  polyethylene glycol powder (GLYCOLAX/MIRALAX) 17 GM/SCOOP powder, Take 17 g by mouth 2 (two) times daily., Disp: 255 g, Rfl: 2  No family history on file.   Social History   Tobacco Use  . Smoking status: Never Smoker  . Smokeless tobacco: Never Used  Substance Use Topics  . Alcohol use: Yes  . Drug use: Not on file    Allergies as of 10/31/2019 - Review Complete 10/31/2019  Allergen Reaction Noted  . Aspirin Nausea Only and Other (See Comments) 05/08/2014  . Penicillins Rash 12/24/2015     Imaging Studies: Reviewed  Assessment and Plan:   Jaime Fitzgerald is a 59 y.o. female with metabolic syndrome, is followed up after hospital discharge for upper GI bleed from gastric ulcer via telephone visit Patient denies any symptoms of active GI bleed today Continue Protonix 40 mg p.o. twice daily, refills sent Continue vitamin B12, refill sent She informed me that she had blood work done on Friday last week at [facility] her she needs to undergo labs to recheck iron studies, B12, H. pylori IgG and she prefers these labs to be done at [facility] as they are free Also, discussed with her about upper endoscopy to confirm healing of the gastric ulcers and take biopsies as well as colonoscopy for colon cancer screening and she is  agreeable Advised her to try over-the-counter MiraLAX or magnesium citrate, drink plenty of water to treat constipation   Follow Up Instructions:   I discussed the assessment and treatment plan with the patient. The patient was provided an opportunity to ask questions and all were answered. The patient agreed with the plan and demonstrated an understanding of the instructions.   The patient was advised to call back or seek an in-person evaluation if the symptoms worsen or if the condition fails to improve as anticipated.  I provided 22 minutes of non-face-to-face time during this encounter.   Follow up in 4 to 6 weeks   Cephas Darby, MD

## 2019-11-01 ENCOUNTER — Encounter: Payer: Self-pay | Admitting: Family Medicine

## 2019-11-01 ENCOUNTER — Other Ambulatory Visit: Payer: Self-pay

## 2019-11-01 DIAGNOSIS — R1084 Generalized abdominal pain: Secondary | ICD-10-CM

## 2019-11-01 DIAGNOSIS — C169 Malignant neoplasm of stomach, unspecified: Secondary | ICD-10-CM

## 2019-11-01 DIAGNOSIS — D500 Iron deficiency anemia secondary to blood loss (chronic): Secondary | ICD-10-CM

## 2019-11-01 DIAGNOSIS — Z1211 Encounter for screening for malignant neoplasm of colon: Secondary | ICD-10-CM

## 2019-11-08 ENCOUNTER — Telehealth: Payer: Self-pay

## 2019-11-08 ENCOUNTER — Other Ambulatory Visit: Admission: RE | Admit: 2019-11-08 | Payer: Medicaid Other | Source: Ambulatory Visit

## 2019-11-08 NOTE — Telephone Encounter (Signed)
Contacted patient in regard to having her COVID test done today, in order to keep her procedures as scheduled for Friday 11/10/19.  Patient states she did not have any transportation to get to have her COVID test, and had no one to stay with her for her procedures.  I asked her to check with friends, or family member to arrange transportation and call us back to reschedule as soon as she could especially since blood loss.  Thanks, [place], [place]

## 2019-11-10 ENCOUNTER — Ambulatory Visit: Admission: RE | Admit: 2019-11-10 | Payer: Medicaid Other | Source: Ambulatory Visit | Admitting: Gastroenterology

## 2019-11-10 ENCOUNTER — Encounter: Admission: RE | Payer: Self-pay | Source: Ambulatory Visit

## 2019-11-10 SURGERY — COLONOSCOPY WITH PROPOFOL
Anesthesia: General

## 2019-11-20 ENCOUNTER — Telehealth: Payer: Self-pay | Admitting: Gastroenterology

## 2019-11-20 ENCOUNTER — Other Ambulatory Visit: Payer: Self-pay

## 2019-11-20 DIAGNOSIS — K250 Acute gastric ulcer with hemorrhage: Secondary | ICD-10-CM

## 2019-11-20 MED ORDER — OMEPRAZOLE 40 MG PO CPDR: 40.0000 mg | DELAYED_RELEASE_CAPSULE | Freq: Two times a day (BID) | ORAL | 0 refills | Status: DC

## 2019-11-20 NOTE — Telephone Encounter (Signed)
Medication has been refilled and sent to pharmacy  

## 2019-11-20 NOTE — Telephone Encounter (Signed)
Pt is calling she states she is out of her medicine for her ulcers rx pantoprozole and needs a refill please call pt

## 2019-12-08 ENCOUNTER — Other Ambulatory Visit: Payer: Self-pay

## 2019-12-08 DIAGNOSIS — K5909 Other constipation: Secondary | ICD-10-CM

## 2019-12-08 DIAGNOSIS — K250 Acute gastric ulcer with hemorrhage: Secondary | ICD-10-CM

## 2019-12-08 MED ORDER — POLYETHYLENE GLYCOL 3350 17 GM/SCOOP PO POWD: 17.0000 g | Freq: Two times a day (BID) | ORAL | 2 refills | Status: DC

## 2019-12-08 MED ORDER — OMEPRAZOLE 40 MG PO CPDR: 40.0000 mg | DELAYED_RELEASE_CAPSULE | Freq: Two times a day (BID) | ORAL | 0 refills | Status: DC

## 2019-12-08 NOTE — Telephone Encounter (Signed)
Last office visit 10/31/2019 Acute gastric ulcer  Last refill Miralax 10/31/2019 2 refills  Omeprazole 11/20/2019 0 refills  Has appointment 12/11/2019

## 2019-12-11 ENCOUNTER — Other Ambulatory Visit: Payer: Self-pay

## 2019-12-11 ENCOUNTER — Ambulatory Visit (INDEPENDENT_AMBULATORY_CARE_PROVIDER_SITE_OTHER): Payer: Medicaid Other | Admitting: Gastroenterology

## 2019-12-11 ENCOUNTER — Encounter: Payer: Self-pay | Admitting: Gastroenterology

## 2019-12-11 DIAGNOSIS — R1013 Epigastric pain: Secondary | ICD-10-CM | POA: Diagnosis not present

## 2019-12-11 DIAGNOSIS — Z1211 Encounter for screening for malignant neoplasm of colon: Secondary | ICD-10-CM

## 2019-12-11 DIAGNOSIS — K250 Acute gastric ulcer with hemorrhage: Secondary | ICD-10-CM

## 2019-12-11 MED ORDER — MECLIZINE HCL 12.5 MG PO TABS: 12.5000 mg | ORAL_TABLET | Freq: Three times a day (TID) | ORAL | 0 refills | Status: DC | PRN

## 2019-12-11 MED ORDER — PANTOPRAZOLE SODIUM 40 MG PO TBEC: 40.0000 mg | DELAYED_RELEASE_TABLET | Freq: Two times a day (BID) | ORAL | 0 refills | Status: DC

## 2019-12-11 MED ORDER — POLYETHYLENE GLYCOL 3350 17 GM/SCOOP PO POWD: 17.0000 g | Freq: Two times a day (BID) | ORAL | 2 refills | Status: DC

## 2019-12-11 MED ORDER — GOLYTELY 236 G PO SOLR: 4000.0000 mL | Freq: Once | ORAL | 0 refills | Status: AC

## 2019-12-11 NOTE — Progress Notes (Signed)
Sherri Sear, MD [address]  [place]  [place], [place] [address]  Main: [phone]  Fax: [phone]    Gastroenterology Consultation Tele Visit  Referring Provider:     [facility] [place] Physician:  [facility], [place] Primary Gastroenterologist:  Dr. Cephas Darby Reason for Consultation: History of peptic ulcer disease, hospital follow-up        HPI:   Jaime Fitzgerald is a 59 y.o. female referred by Dr. Domingo Madeira, [place]  for consultation & management of peptic ulcer disease resulting in iron deficiency anemia and GI bleed.  Virtual Visit via Telephone Note  I connected with Jaime Fitzgerald on 12/11/19 at 10:30 AM EST by telephone and verified that I am speaking with the correct person using two identifiers.   I discussed the limitations, risks, security and privacy concerns of performing an evaluation and management service by telephone and the availability of in person appointments. I also discussed with the patient that there may be a patient responsible charge related to this service. The patient expressed understanding and agreed to proceed.  Location of the Patient: Home  Location of the provider: Office  Persons participating in the visit: Patient and provider, patient's nurse  History of Present Illness: Jaime Fitzgerald was recently admitted to [facility] on 10/11/2019 secondary to severe upper GI bleed with drop in hemoglobin from 14.5 in 7/20 to 6.9 requiring blood transfusion.  She underwent CT angio which revealed mild atherosclerosis, widely patent celiac, SMA and other major arteries.  She underwent EGD which revealed bleeding gastric ulcers that were treated with cautery and APC.  Patient had a repeat EGD 48 hours later which did not show any active bleeding from the previously treated gastric ulcers.  She was found to have severe iron deficiency as well as B12 deficiency she was discharged home on Protonix  40 mg twice daily as well as oral B12 Since discharge, patient denies rectal bleeding, black stools, hematemesis or coffee-ground emesis.  She does report abdominal cramping and has not had a bowel movement for several days.  She acknowledges taking Protonix 40 mg 2 times a day along with B12 daily   She did not come for an in office appointment today due to lack of transportation  Follow-up visit 821 Patient did not undergo EGD and colonoscopy as recommended due to lack of transportation.  She said she ran out of pantoprazole.  She denies any epigastric pain, nausea or vomiting, black stools.  She does feel dizzy, requesting to refill meclizine.  Her last hemoglobin was normal in 10/2019, hemoglobin 12  NSAIDs: None  Antiplts/Anticoagulants/Anti thrombotics: None  GI Procedures: EGD 10/13/2019 - Normal esophagus. - Non-bleeding gastric ulcers. - Normal duodenal bulb, second portion of the duodenum and examined duodenum. - No specimens collected.  Denies having had a colonoscopy in the past  Past Medical History:  Diagnosis Date  . Acute blood loss anemia 10/11/2019  . Acute GI bleeding 10/11/2019  . Asthma   . Diabetes mellitus without complication ([place])   . Hypertension   . Seizures ([place])     Past Surgical History:  Procedure Laterality Date  . CARPAL TUNNEL RELEASE    . ESOPHAGOGASTRODUODENOSCOPY N/A 10/13/2019   Procedure: ESOPHAGOGASTRODUODENOSCOPY (EGD);  Surgeon: Virgel Manifold, MD;  Location: [facility] ENDOSCOPY;  Service: Endoscopy;  Laterality: N/A;  . ESOPHAGOGASTRODUODENOSCOPY (EGD) WITH PROPOFOL N/A 10/11/2019   Procedure: ESOPHAGOGASTRODUODENOSCOPY (EGD) WITH PROPOFOL;  Surgeon: Vonda Antigua  B, MD;  Location: [facility] ENDOSCOPY;  Service: Endoscopy;  Laterality: N/A;  . TONSILLECTOMY      Current Outpatient Medications:  .  albuterol (VENTOLIN HFA) 108 (90 Base) MCG/ACT inhaler, Inhale 2 puffs into the lungs every 6 (six) hours as needed for wheezing or shortness of  breath., Disp: , Rfl:  .  cloNIDine (CATAPRES) 0.1 MG tablet, Take 0.1 mg by mouth 2 (two) times daily., Disp: , Rfl:  .  gabapentin (NEURONTIN) 300 MG capsule, Take 300 mg by mouth 3 (three) times daily., Disp: , Rfl:  .  glipiZIDE (GLUCOTROL XL) 10 MG 24 hr tablet, Take 10 mg by mouth 2 (two) times daily., Disp: , Rfl:  .  meclizine (ANTIVERT) 12.5 MG tablet, Take 1 tablet (12.5 mg total) by mouth 3 (three) times daily as needed for up to 30 doses for dizziness., Disp: 30 tablet, Rfl: 0 .  pravastatin (PRAVACHOL) 40 MG tablet, Take 40 mg by mouth daily., Disp: , Rfl:  .  pantoprazole (PROTONIX) 40 MG tablet, Take 1 tablet (40 mg total) by mouth 2 (two) times daily before a meal., Disp: 180 tablet, Rfl: 0 .  polyethylene glycol (GOLYTELY) 236 g solution, Take 4,000 mLs by mouth once for 1 dose., Disp: 4000 mL, Rfl: 0 .  topiramate (TOPAMAX) 100 MG tablet, Take 200 mg by mouth 2 (two) times daily., Disp: , Rfl:   History reviewed. No pertinent family history.   Social History   Tobacco Use  . Smoking status: Never Smoker  . Smokeless tobacco: Never Used  Substance Use Topics  . Alcohol use: Yes  . Drug use: Not on file    Allergies as of 12/11/2019 - Review Complete 12/11/2019  Allergen Reaction Noted  . Aspirin Nausea Only and Other (See Comments) 05/08/2014  . Penicillins Rash 12/24/2015     Imaging Studies: Reviewed  Assessment and Plan:   Jaime Fitzgerald is a 59 y.o. female with metabolic syndrome, is followed up after hospital discharge for upper GI bleed from gastric ulcer via telephone visit Patient denies any symptoms of active GI bleed today Continue Protonix 40 mg p.o. twice daily, refills sent Advised her she needs to undergo labs to recheck iron studies, B12, H. pylori IgG and she prefers these labs to be done at [facility] as they are free, will be mailed to patient Also, discussed with her about upper endoscopy to confirm healing of the gastric ulcers and take  biopsies as well as colonoscopy for colon cancer screening and she is agreeable   Follow Up Instructions:   I discussed the assessment and treatment plan with the patient. The patient was provided an opportunity to ask questions and all were answered. The patient agreed with the plan and demonstrated an understanding of the instructions.   The patient was advised to call back or seek an in-person evaluation if the symptoms worsen or if the condition fails to improve as anticipated.  I provided 14 minutes of non-face-to-face time during this encounter.   Follow up in 4 to 6 weeks   Cephas Darby, MD

## 2019-12-13 ENCOUNTER — Telehealth: Payer: Self-pay | Admitting: Gastroenterology

## 2019-12-13 NOTE — Telephone Encounter (Signed)
[facility] left vm the [place] is out of order and on back order with no release date  please call pharmacy to let them know if they can do the Miralax cb [phone]

## 2019-12-15 ENCOUNTER — Telehealth: Payer: Self-pay

## 2019-12-15 NOTE — Telephone Encounter (Signed)
Patient has been asked to come by the office to pick up a sample of Plenvu bowel prep-to avoid having to cancel her colonoscopy.  Thanks,  [place], [place]

## 2020-01-15 ENCOUNTER — Other Ambulatory Visit: Payer: Self-pay

## 2020-01-15 ENCOUNTER — Other Ambulatory Visit
Admission: RE | Admit: 2020-01-15 | Discharge: 2020-01-15 | Disposition: A | Discharge status: Home / Self Care | Payer: Medicaid Other | Source: Ambulatory Visit | Attending: Gastroenterology | Admitting: Gastroenterology

## 2020-01-15 DIAGNOSIS — Z01812 Encounter for preprocedural laboratory examination: Secondary | ICD-10-CM | POA: Insufficient documentation

## 2020-01-15 DIAGNOSIS — Z20822 Contact with and (suspected) exposure to covid-19: Secondary | ICD-10-CM | POA: Diagnosis not present

## 2020-01-15 LAB — SARS CORONAVIRUS 2 (TAT 6-24 HRS): SARS Coronavirus 2: NEGATIVE

## 2020-01-17 ENCOUNTER — Encounter: Payer: Self-pay | Admitting: Gastroenterology

## 2020-01-17 ENCOUNTER — Other Ambulatory Visit: Payer: Self-pay

## 2020-01-17 ENCOUNTER — Encounter
Admission: RE | Disposition: A | Discharge status: Home / Self Care | Payer: Self-pay | Source: Home / Self Care | Attending: Gastroenterology

## 2020-01-17 ENCOUNTER — Ambulatory Visit: Payer: Medicaid Other | Admitting: Anesthesiology

## 2020-01-17 ENCOUNTER — Ambulatory Visit
Admission: RE | Admit: 2020-01-17 | Discharge: 2020-01-17 | Disposition: A | Discharge status: Home / Self Care | Payer: Medicaid Other | Attending: Gastroenterology | Admitting: Gastroenterology

## 2020-01-17 DIAGNOSIS — R519 Headache, unspecified: Secondary | ICD-10-CM | POA: Insufficient documentation

## 2020-01-17 DIAGNOSIS — R569 Unspecified convulsions: Secondary | ICD-10-CM | POA: Diagnosis not present

## 2020-01-17 DIAGNOSIS — K253 Acute gastric ulcer without hemorrhage or perforation: Secondary | ICD-10-CM | POA: Diagnosis not present

## 2020-01-17 DIAGNOSIS — J45909 Unspecified asthma, uncomplicated: Secondary | ICD-10-CM | POA: Insufficient documentation

## 2020-01-17 DIAGNOSIS — I10 Essential (primary) hypertension: Secondary | ICD-10-CM | POA: Insufficient documentation

## 2020-01-17 DIAGNOSIS — K635 Polyp of colon: Secondary | ICD-10-CM | POA: Diagnosis not present

## 2020-01-17 DIAGNOSIS — E119 Type 2 diabetes mellitus without complications: Secondary | ICD-10-CM | POA: Insufficient documentation

## 2020-01-17 DIAGNOSIS — Z1211 Encounter for screening for malignant neoplasm of colon: Secondary | ICD-10-CM | POA: Diagnosis not present

## 2020-01-17 DIAGNOSIS — D122 Benign neoplasm of ascending colon: Secondary | ICD-10-CM | POA: Diagnosis not present

## 2020-01-17 DIAGNOSIS — Z880 Allergy status to penicillin: Secondary | ICD-10-CM | POA: Diagnosis not present

## 2020-01-17 DIAGNOSIS — Z7982 Long term (current) use of aspirin: Secondary | ICD-10-CM | POA: Diagnosis not present

## 2020-01-17 DIAGNOSIS — Z7984 Long term (current) use of oral hypoglycemic drugs: Secondary | ICD-10-CM | POA: Diagnosis not present

## 2020-01-17 DIAGNOSIS — K250 Acute gastric ulcer with hemorrhage: Secondary | ICD-10-CM

## 2020-01-17 DIAGNOSIS — K5730 Diverticulosis of large intestine without perforation or abscess without bleeding: Secondary | ICD-10-CM | POA: Diagnosis not present

## 2020-01-17 DIAGNOSIS — F17200 Nicotine dependence, unspecified, uncomplicated: Secondary | ICD-10-CM | POA: Insufficient documentation

## 2020-01-17 DIAGNOSIS — Z79899 Other long term (current) drug therapy: Secondary | ICD-10-CM | POA: Insufficient documentation

## 2020-01-17 HISTORY — PX: ESOPHAGOGASTRODUODENOSCOPY (EGD) WITH PROPOFOL: SHX5813

## 2020-01-17 HISTORY — PX: COLONOSCOPY WITH PROPOFOL: SHX5780

## 2020-01-17 LAB — CBC WITH DIFFERENTIAL/PLATELET
Abs Immature Granulocytes: 0.02 10*3/uL (ref 0.00–0.07)
Basophils Absolute: 0 10*3/uL (ref 0.0–0.1)
Basophils Relative: 0 %
Eosinophils Absolute: 0.1 10*3/uL (ref 0.0–0.5)
Eosinophils Relative: 2 %
HCT: 43.1 % (ref 36.0–46.0)
Hemoglobin: 13.4 g/dL (ref 12.0–15.0)
Immature Granulocytes: 0 %
Lymphocytes Relative: 31 %
Lymphs Abs: 1.8 10*3/uL (ref 0.7–4.0)
MCH: 32.9 pg (ref 26.0–34.0)
MCHC: 31.1 g/dL (ref 30.0–36.0)
MCV: 105.9 fL — ABNORMAL HIGH (ref 80.0–100.0)
Monocytes Absolute: 0.3 10*3/uL (ref 0.1–1.0)
Monocytes Relative: 5 %
Neutro Abs: 3.7 10*3/uL (ref 1.7–7.7)
Neutrophils Relative %: 62 %
Platelets: 226 10*3/uL (ref 150–400)
RBC: 4.07 MIL/uL (ref 3.87–5.11)
RDW: 14.6 % (ref 11.5–15.5)
WBC: 6 10*3/uL (ref 4.0–10.5)
nRBC: 0 % (ref 0.0–0.2)

## 2020-01-17 LAB — FOLATE: Folate: 16.1 ng/mL (ref >5.9)

## 2020-01-17 LAB — IRON AND TIBC
Iron: 46 ug/dL (ref 28–170)
Saturation Ratios: 15 % (ref 10.4–31.8)
TIBC: 312 ug/dL (ref 250–450)
UIBC: 266 ug/dL

## 2020-01-17 LAB — GLUCOSE, CAPILLARY: Glucose-Capillary: 165 mg/dL — ABNORMAL HIGH (ref 70–99)

## 2020-01-17 LAB — VITAMIN B12: Vitamin B-12: 189 pg/mL (ref 180–914)

## 2020-01-17 SURGERY — COLONOSCOPY WITH PROPOFOL
Anesthesia: General

## 2020-01-17 MED ORDER — MIDAZOLAM HCL 2 MG/2ML IJ SOLN
INTRAMUSCULAR | Status: AC
  Filled 2020-01-17: qty 2

## 2020-01-17 MED ORDER — MIDAZOLAM HCL 2 MG/2ML IJ SOLN
INTRAMUSCULAR | Status: DC | PRN
  Administered 2020-01-17 (×2): 1 mg via INTRAVENOUS

## 2020-01-17 MED ORDER — FENTANYL CITRATE (PF) 100 MCG/2ML IJ SOLN
INTRAMUSCULAR | Status: DC | PRN
  Administered 2020-01-17 (×2): 25 ug via INTRAVENOUS
  Administered 2020-01-17: 50 ug via INTRAVENOUS

## 2020-01-17 MED ORDER — BUTAMBEN-TETRACAINE-BENZOCAINE 2-2-14 % EX AERO
INHALATION_SPRAY | CUTANEOUS | Status: AC
  Filled 2020-01-17: qty 5

## 2020-01-17 MED ORDER — SODIUM CHLORIDE 0.9 % IV SOLN: INTRAVENOUS | Status: DC

## 2020-01-17 MED ORDER — LIDOCAINE HCL (CARDIAC) PF 100 MG/5ML IV SOSY
PREFILLED_SYRINGE | INTRAVENOUS | Status: DC | PRN
  Administered 2020-01-17: 50 mg via INTRAVENOUS

## 2020-01-17 MED ORDER — LABETALOL HCL 5 MG/ML IV SOLN
10.0000 mg | Freq: Once | INTRAVENOUS | Status: DC
  Filled 2020-01-17: qty 4

## 2020-01-17 MED ORDER — PROPOFOL 500 MG/50ML IV EMUL
INTRAVENOUS | Status: DC | PRN
  Administered 2020-01-17: 100 ug/kg/min via INTRAVENOUS

## 2020-01-17 MED ORDER — PROPOFOL 500 MG/50ML IV EMUL
INTRAVENOUS | Status: AC
  Filled 2020-01-17: qty 50

## 2020-01-17 MED ORDER — FENTANYL CITRATE (PF) 100 MCG/2ML IJ SOLN
INTRAMUSCULAR | Status: AC
  Filled 2020-01-17: qty 2

## 2020-01-17 MED ORDER — LIDOCAINE HCL (PF) 2 % IJ SOLN
INTRAMUSCULAR | Status: AC
  Filled 2020-01-17: qty 5

## 2020-01-17 NOTE — H&P (Signed)
Jaime Darby, MD [address]  [place]  [place], Jaime [place] [address]  Main: [phone]  Fax: [phone] Pager: [phone]  Primary Care Physician:  [facility] [place] Primary Gastroenterologist:  Dr. Cephas Fitzgerald  Pre-Procedure History & Physical: HPI:  Jaime Fitzgerald is a 59 y.o. female is here for an endoscopy and colonoscopy.   Past Medical History:  Diagnosis Date  . Acute blood loss anemia 10/11/2019  . Acute GI bleeding 10/11/2019  . Asthma   . Diabetes mellitus without complication ([place])   . Hypertension   . Seizures ([place])     Past Surgical History:  Procedure Laterality Date  . CARPAL TUNNEL RELEASE    . ESOPHAGOGASTRODUODENOSCOPY N/A 10/13/2019   Procedure: ESOPHAGOGASTRODUODENOSCOPY (EGD);  Surgeon: Jaime Manifold, MD;  Location: [facility] ENDOSCOPY;  Service: Endoscopy;  Laterality: N/A;  . ESOPHAGOGASTRODUODENOSCOPY (EGD) WITH PROPOFOL N/A 10/11/2019   Procedure: ESOPHAGOGASTRODUODENOSCOPY (EGD) WITH PROPOFOL;  Surgeon: Jaime Manifold, MD;  Location: [facility] ENDOSCOPY;  Service: Endoscopy;  Laterality: N/A;  . TONSILLECTOMY      Prior to Admission medications   Medication Sig Start Date End Date Taking? Authorizing Provider  albuterol (VENTOLIN HFA) 108 (90 Base) MCG/ACT inhaler Inhale 2 puffs into the lungs every 6 (six) hours as needed for wheezing or shortness of breath. 10/14/19  Yes Nolberto Hanlon, MD  cloNIDine (CATAPRES) 0.1 MG tablet Take 0.1 mg by mouth 2 (two) times daily.   Yes [provider]  gabapentin (NEURONTIN) 300 MG capsule Take 300 mg by mouth 3 (three) times daily. 08/14/13  Yes [provider]  glipiZIDE (GLUCOTROL XL) 10 MG 24 hr tablet Take 10 mg by mouth 2 (two) times daily.   Yes [provider]  meclizine (ANTIVERT) 12.5 MG tablet Take 1 tablet (12.5 mg total) by mouth 3 (three) times daily as needed for up to 30 doses for dizziness. 12/11/19  Yes Ollivander See, Tally Due, MD  pantoprazole  (PROTONIX) 40 MG tablet Take 1 tablet (40 mg total) by mouth 2 (two) times daily before a meal. 12/11/19 03/10/20 Yes Jaime Fitzgerald, Tally Due, MD  polyethylene glycol powder (GLYCOLAX/MIRALAX) 17 GM/SCOOP powder Take 17 g by mouth 2 (two) times daily. 12/11/19  Yes Jaime Fitzgerald, Tally Due, MD  pravastatin (PRAVACHOL) 40 MG tablet Take 40 mg by mouth daily.   Yes [provider]  topiramate (TOPAMAX) 100 MG tablet Take 200 mg by mouth 2 (two) times daily. 06/14/13  Yes [provider]    Allergies as of 12/11/2019 - Review Complete 12/11/2019  Allergen Reaction Noted  . Aspirin Nausea Only and Other (See Comments) 05/08/2014  . Penicillins Rash 12/24/2015    History reviewed. No pertinent family history.  Social History   Socioeconomic History  . Marital status: Legally Separated    Spouse name: Not on file  . Number of children: Not on file  . Years of education: Not on file  . Highest education level: Not on file  Occupational History  . Not on file  Tobacco Use  . Smoking status: Never Smoker  . Smokeless tobacco: Never Used  Substance and Sexual Activity  . Alcohol use: Yes  . Drug use: Not on file  . Sexual activity: Not on file  Other Topics Concern  . Not on file  Social History Narrative  . Not on file   Social Determinants of Health   Financial Resource Strain:   . Difficulty of Paying Living Expenses:   Food Insecurity:   .  Worried About Charity fundraiser in the Last Year:   . Arboriculturist in the Last Year:   Transportation Needs:   . Film/video editor (Medical):   Jaime Fitzgerald Lack of Transportation (Non-Medical):   Physical Activity:   . Days of Exercise per Week:   . Minutes of Exercise per Session:   Stress:   . Feeling of Stress :   Social Connections:   . Frequency of Communication with Friends and Family:   . Frequency of Social Gatherings with Friends and Family:   . Attends Religious Services:   . Active Member of Clubs or Organizations:   .  Attends Archivist Meetings:   Jaime Fitzgerald Marital Status:   Intimate Partner Violence:   . Fear of Current or Ex-Partner:   . Emotionally Abused:   Jaime Fitzgerald Physically Abused:   . Sexually Abused:     Review of Systems: See HPI, otherwise negative ROS  Physical Exam: BP (!) 164/81   Pulse 82   Temp (!) 96.7 F (35.9 C) (Temporal)   Resp 17   Ht 5\' 3"  (1.6 m)   Wt 81.6 kg   SpO2 100%   BMI 31.89 kg/m  General:   Alert,  pleasant and cooperative in NAD Head:  Normocephalic and atraumatic. Neck:  Supple; no masses or thyromegaly. Lungs:  Clear throughout to auscultation.    Heart:  Regular rate and rhythm. Abdomen:  Soft, nontender and nondistended. Normal bowel sounds, without guarding, and without rebound.   Neurologic:  Alert and  oriented x4;  grossly normal neurologically.  Impression/Plan: Jaime Fitzgerald is here for an endoscopy and colonoscopy to be performed for follow up of gastric ulcers, colon cancer screening  Risks, benefits, limitations, and alternatives regarding  endoscopy and colonoscopy have been reviewed with the patient.  Questions have been answered.  All parties agreeable.   Jaime Sear, MD  01/17/2020, 9:14 AM

## 2020-01-17 NOTE — Anesthesia Preprocedure Evaluation (Signed)
Anesthesia Evaluation  Patient identified by MRN, date of birth, ID band Patient awake    Reviewed: Allergy & Precautions, NPO status , Patient's Chart, lab work & pertinent test results  History of Anesthesia Complications Negative for: history of anesthetic complications  Airway Mallampati: II  TM Distance: >3 FB Neck ROM: Full    Dental  (+) Edentulous Upper, Edentulous Lower   Pulmonary asthma , neg sleep apnea, Current Smoker and Patient abstained from smoking.,    breath sounds clear to auscultation- rhonchi (-) wheezing      Cardiovascular hypertension, Pt. on medications (-) CAD, (-) Past MI, (-) Cardiac Stents and (-) CABG  Rhythm:Regular Rate:Normal - Systolic murmurs and - Diastolic murmurs    Neuro/Psych  Headaches, Seizures -,  negative psych ROS   GI/Hepatic PUD,   Endo/Other  diabetes, Oral Hypoglycemic Agents  Renal/GU Renal InsufficiencyRenal disease     Musculoskeletal  (+) Arthritis ,   Abdominal (+) + obese,   Peds negative pediatric ROS (+)  Hematology  (+) anemia ,   Anesthesia Other Findings Past Medical History: No date: Asthma No date: Diabetes mellitus without complication ([facility]) No date: Hypertension No date: Seizures ([place])   Reproductive/Obstetrics                             Anesthesia Physical  Anesthesia Plan  ASA: III  Anesthesia Plan: General   Post-op Pain Management:    Induction: Intravenous  PONV Risk Score and Plan: 2 and Propofol infusion  Airway Management Planned: Natural Airway  Additional Equipment:   Intra-op Plan:   Post-operative Plan:   Informed Consent: I have reviewed the patients History and Physical, chart, labs and discussed the procedure including the risks, benefits and alternatives for the proposed anesthesia with the patient or authorized representative who has indicated his/her understanding and acceptance.      Dental advisory given  Plan Discussed with: CRNA and Anesthesiologist  Anesthesia Plan Comments:         Anesthesia Quick Evaluation

## 2020-01-17 NOTE — Op Note (Signed)
[facility] Gastroenterology Patient Name: Jaime Fitzgerald Procedure Date: 01/17/2020 9:13 AM MRN: [id number] Account #: [id number] Date of Birth: [birth year] Admit Type: Outpatient Age: 59 Room: [facility] ENDO ROOM 4 Gender: Female Note Status: Finalized Procedure:             Upper GI endoscopy Indications:           Follow-up of acute gastric ulcer Providers:             Lin Landsman MD, MD Medicines:             Monitored Anesthesia Care Complications:         No immediate complications. Estimated blood loss: None. Procedure:             Pre-Anesthesia Assessment:                        - Prior to the procedure, a History and Physical was                         performed, and patient medications and allergies were                         reviewed. The patient is competent. The risks and                         benefits of the procedure and the sedation options and                         risks were discussed with the patient. All questions                         were answered and informed consent was obtained.                         Patient identification and proposed procedure were                         verified by the physician, the nurse, the                         anesthesiologist, the anesthetist and the technician                         in the pre-procedure area in the procedure room in the                         endoscopy suite. Mental Status Examination: alert and                         oriented. Airway Examination: normal oropharyngeal                         airway and neck mobility. Respiratory Examination:                         clear to auscultation. CV Examination: normal.                         Prophylactic Antibiotics: The patient  does not require                         prophylactic antibiotics. Prior Anticoagulants: The                         patient has taken no previous anticoagulant or                         antiplatelet agents.  ASA Grade Assessment: III - A                         patient with severe systemic disease. After reviewing                         the risks and benefits, the patient was deemed in                         satisfactory condition to undergo the procedure. The                         anesthesia plan was to use monitored anesthesia care                         (MAC). Immediately prior to administration of                         medications, the patient was re-assessed for adequacy                         to receive sedatives. The heart rate, respiratory                         rate, oxygen saturations, blood pressure, adequacy of                         pulmonary ventilation, and response to care were                         monitored throughout the procedure. The physical                         status of the patient was re-assessed after the                         procedure.                        After obtaining informed consent, the endoscope was                         passed under direct vision. Throughout the procedure,                         the patient's blood pressure, pulse, and oxygen                         saturations were monitored continuously. The Endoscope  was introduced through the mouth, and advanced to the                         second part of duodenum. The upper GI endoscopy was                         accomplished without difficulty. The patient tolerated                         the procedure poorly due to the patient's respiratory                         instability (hypoxia). Findings:      The duodenal bulb and second portion of the duodenum were normal.      A large amount of food (residue) was found in the gastric body.      The incisura and gastric antrum were normal. No evidence of ulcers seen.       Biopsies were not performed due to patient's hypoxia      The examined esophagus was normal. Impression:            - Normal duodenal  bulb and second portion of the                         duodenum.                        - A large amount of food (residue) in the stomach.                        - Normal incisura and antrum.                        - Normal esophagus.                        - No specimens collected. Recommendation:        - Proceed with colonoscopy as scheduled                        See colonoscopy report Procedure Code(s):     --- Professional ---                        [phone], Esophagogastroduodenoscopy, flexible,                         transoral; diagnostic, including collection of                         specimen(s) by brushing or washing, when performed                         (separate procedure) Diagnosis Code(s):     --- Professional ---                        K25.3, Acute gastric ulcer without hemorrhage or                         perforation CPT copyright 2019 American Medical Association. All rights reserved.  The codes documented in this report are preliminary and upon coder review may  be revised to meet current compliance requirements. Dr. Ulyess Mort Lin Landsman MD, MD 01/17/2020 9:35:41 AM This report has been signed electronically. Number of Addenda: 0 Note Initiated On: 01/17/2020 9:13 AM Estimated Blood Loss:  Estimated blood loss: none.      [facility]

## 2020-01-17 NOTE — Op Note (Signed)
[facility] Gastroenterology Patient Name: Forrest Demuro Procedure Date: 01/17/2020 9:13 AM MRN: [id number] Account #: [id number] Date of Birth: Mar 05, 1961 Admit Type: Outpatient Age: 59 Room: [facility] ENDO ROOM 4 Gender: Female Note Status: Finalized Procedure:             Colonoscopy Indications:           Screening for colorectal malignant neoplasm, Last                         colonoscopy: September 2010 Providers:             Lin Landsman MD, MD Medicines:             Monitored Anesthesia Care Complications:         No immediate complications. Estimated blood loss: None. Procedure:             Pre-Anesthesia Assessment:                        - Prior to the procedure, a History and Physical was                         performed, and patient medications and allergies were                         reviewed. The patient is competent. The risks and                         benefits of the procedure and the sedation options and                         risks were discussed with the patient. All questions                         were answered and informed consent was obtained.                         Patient identification and proposed procedure were                         verified by the physician, the nurse, the                         anesthesiologist, the anesthetist and the technician                         in the pre-procedure area in the procedure room in the                         endoscopy suite. Mental Status Examination: alert and                         oriented. Airway Examination: normal oropharyngeal                         airway and neck mobility. Respiratory Examination:                         clear to auscultation. CV Examination: normal.  Prophylactic Antibiotics: The patient does not require                         prophylactic antibiotics. Prior Anticoagulants: The                         patient has taken no previous  anticoagulant or                         antiplatelet agents. ASA Grade Assessment: III - A                         patient with severe systemic disease. After reviewing                         the risks and benefits, the patient was deemed in                         satisfactory condition to undergo the procedure. The                         anesthesia plan was to use monitored anesthesia care                         (MAC). Immediately prior to administration of                         medications, the patient was re-assessed for adequacy                         to receive sedatives. The heart rate, respiratory                         rate, oxygen saturations, blood pressure, adequacy of                         pulmonary ventilation, and response to care were                         monitored throughout the procedure. The physical                         status of the patient was re-assessed after the                         procedure.                        After obtaining informed consent, the colonoscope was                         passed under direct vision. Throughout the procedure,                         the patient's blood pressure, pulse, and oxygen                         saturations were monitored continuously. The  Colonoscope was introduced through the anus and                         advanced to the the cecum, identified by appendiceal                         orifice and ileocecal valve. The colonoscopy was                         performed without difficulty. The patient tolerated                         the procedure well. The quality of the bowel                         preparation was evaluated using the BBPS [facility] Bowel                         Preparation Scale) with scores of: Right Colon = 3,                         Transverse Colon = 3 and Left Colon = 3 (entire mucosa                         seen well with no residual staining, small  fragments                         of stool or opaque liquid). The total BBPS score                         equals 9. Findings:      Multiple diverticula were found in the sigmoid colon and descending       colon.      A diminutive polyp was found in the ascending colon. The polyp was       sessile. The polyp was removed with a cold biopsy forceps. Resection and       retrieval were complete.      The retroflexed view of the distal rectum and anal verge was normal and       showed no anal or rectal abnormalities. Impression:            - Diverticulosis in the sigmoid colon and in the                         descending colon.                        - One diminutive polyp in the ascending colon, removed                         with a cold biopsy forceps. Resected and retrieved.                        - The distal rectum and anal verge are normal on                         retroflexion view. Recommendation:        -  Discharge patient to home (with escort).                        - Resume previous diet today.                        - Continue present medications.                        - Await pathology results.                        - Repeat colonoscopy in 7-10 years for surveillance                         based on pathology results. Procedure Code(s):     --- Professional ---                        [phone], Colonoscopy, flexible; with biopsy, single or                         multiple Diagnosis Code(s):     --- Professional ---                        Z12.11, Encounter for screening for malignant neoplasm                         of colon                        K63.5, Polyp of colon                        K57.30, Diverticulosis of large intestine without                         perforation or abscess without bleeding CPT copyright 2019 American Medical Association. All rights reserved. The codes documented in this report are preliminary and upon coder review may  be revised to meet  current compliance requirements. Dr. Ulyess Mort Lin Landsman MD, MD 01/17/2020 10:01:59 AM This report has been signed electronically. Number of Addenda: 0 Note Initiated On: 01/17/2020 9:13 AM Scope Withdrawal Time: 0 hours 13 minutes 14 seconds  Total Procedure Duration: 0 hours 17 minutes 16 seconds  Estimated Blood Loss:  Estimated blood loss: none.      [facility]

## 2020-01-17 NOTE — Transfer of Care (Signed)
Immediate Anesthesia Transfer of Care Note  Patient: Jaime Fitzgerald  Procedure(s) Performed: COLONOSCOPY WITH PROPOFOL (N/A ) ESOPHAGOGASTRODUODENOSCOPY (EGD) WITH PROPOFOL (N/A )  Patient Location: PACU  Anesthesia Type:MAC  Level of Consciousness: awake and alert   Airway & Oxygen Therapy: Patient Spontanous Breathing and Patient connected to nasal cannula oxygen  Post-op Assessment: Report given to RN and Post -op Vital signs reviewed and stable  Post vital signs: Reviewed and stable  Last Vitals:  Vitals Value Taken Time  BP    Temp    Pulse    Resp    SpO2      Last Pain:  Vitals:   01/17/20 0843  TempSrc: Temporal  PainSc: 0-No pain         Complications: No apparent anesthesia complications

## 2020-01-17 NOTE — Anesthesia Postprocedure Evaluation (Signed)
Anesthesia Post Note  Patient: MATTESON PETRICH  Procedure(s) Performed: COLONOSCOPY WITH PROPOFOL (N/A ) ESOPHAGOGASTRODUODENOSCOPY (EGD) WITH PROPOFOL (N/A )  Patient location during evaluation: Endoscopy Anesthesia Type: General Level of consciousness: awake and alert and oriented Pain management: pain level controlled Vital Signs Assessment: post-procedure vital signs reviewed and stable Respiratory status: spontaneous breathing Cardiovascular status: blood pressure returned to baseline Anesthetic complications: no     Last Vitals:  Vitals:   01/17/20 1056 01/17/20 1116  BP: (!) 190/92 (!) 189/93  Pulse: 69 66  Resp: 16 16  Temp:    SpO2: 98% 96%    Last Pain:  Vitals:   01/17/20 1056  TempSrc:   PainSc: 0-No pain                 Rigel Filsinger

## 2020-01-18 ENCOUNTER — Encounter: Payer: Self-pay | Admitting: *Deleted

## 2020-01-18 LAB — SURGICAL PATHOLOGY

## 2020-01-19 ENCOUNTER — Encounter: Payer: Self-pay | Admitting: Gastroenterology

## 2020-01-22 ENCOUNTER — Telehealth: Payer: Self-pay

## 2020-01-22 DIAGNOSIS — E538 Deficiency of other specified B group vitamins: Secondary | ICD-10-CM

## 2020-01-22 MED ORDER — VITAMIN B-12 1000 MCG PO TABS: 1000.0000 ug | ORAL_TABLET | Freq: Every day | ORAL | 1 refills | Status: DC

## 2020-01-22 NOTE — Telephone Encounter (Signed)
Called and left a message for call back  

## 2020-01-22 NOTE — Telephone Encounter (Signed)
Patient verbalized understanding of results. She wanted [place] to send medication to her mail order pharmacy. Sent medication and order labs for 1 month

## 2020-01-22 NOTE — Telephone Encounter (Signed)
-----   Message from Lin Landsman, MD sent at 01/19/2020  1:29 PM EDT ----- She has B12 deficiency.  Recommend oral vitamin B12 1000MCG daily for 1 month, then recheck levels  Rohini Vanga

## 2020-01-22 NOTE — Addendum Note (Signed)
Addended by: Ulyess Blossom L on: 01/22/2020 03:04 PM   Modules accepted: Orders

## 2020-01-22 NOTE — Telephone Encounter (Signed)
Called patient on primary number and left a message for call back. Called patient also on home number and left a message for call back

## 2020-02-23 ENCOUNTER — Other Ambulatory Visit: Payer: Self-pay | Admitting: Gastroenterology

## 2020-02-23 DIAGNOSIS — K250 Acute gastric ulcer with hemorrhage: Secondary | ICD-10-CM

## 2020-02-23 DIAGNOSIS — R1013 Epigastric pain: Secondary | ICD-10-CM

## 2020-05-22 ENCOUNTER — Other Ambulatory Visit: Payer: Self-pay | Admitting: Gastroenterology

## 2020-05-22 DIAGNOSIS — K250 Acute gastric ulcer with hemorrhage: Secondary | ICD-10-CM

## 2020-05-22 DIAGNOSIS — R1013 Epigastric pain: Secondary | ICD-10-CM

## 2020-07-23 ENCOUNTER — Other Ambulatory Visit: Payer: Self-pay | Admitting: Gastroenterology

## 2020-07-23 NOTE — Telephone Encounter (Signed)
Last office visit 12/11/2019 dyspepsia  Last refill 01/22/2020 1 refills  Last lab 01/17/2020 No appointment is scheduled

## 2020-09-02 ENCOUNTER — Other Ambulatory Visit: Payer: Self-pay | Admitting: Gastroenterology

## 2020-09-17 ENCOUNTER — Emergency Department: Payer: Medicaid Other

## 2020-09-17 ENCOUNTER — Other Ambulatory Visit: Payer: Self-pay

## 2020-09-17 ENCOUNTER — Inpatient Hospital Stay
Admission: EM | Admit: 2020-09-17 | Discharge: 2020-09-20 | DRG: 372 | Disposition: A | Discharge status: Home / Self Care | Payer: Medicaid Other | Attending: Internal Medicine | Admitting: Internal Medicine

## 2020-09-17 DIAGNOSIS — K449 Diaphragmatic hernia without obstruction or gangrene: Secondary | ICD-10-CM | POA: Diagnosis present

## 2020-09-17 DIAGNOSIS — E860 Dehydration: Secondary | ICD-10-CM | POA: Diagnosis present

## 2020-09-17 DIAGNOSIS — E669 Obesity, unspecified: Secondary | ICD-10-CM | POA: Diagnosis present

## 2020-09-17 DIAGNOSIS — K920 Hematemesis: Secondary | ICD-10-CM

## 2020-09-17 DIAGNOSIS — R1013 Epigastric pain: Secondary | ICD-10-CM

## 2020-09-17 DIAGNOSIS — N179 Acute kidney failure, unspecified: Secondary | ICD-10-CM | POA: Diagnosis present

## 2020-09-17 DIAGNOSIS — Z8711 Personal history of peptic ulcer disease: Secondary | ICD-10-CM

## 2020-09-17 DIAGNOSIS — R109 Unspecified abdominal pain: Secondary | ICD-10-CM

## 2020-09-17 DIAGNOSIS — E119 Type 2 diabetes mellitus without complications: Secondary | ICD-10-CM | POA: Diagnosis present

## 2020-09-17 DIAGNOSIS — A09 Infectious gastroenteritis and colitis, unspecified: Secondary | ICD-10-CM

## 2020-09-17 DIAGNOSIS — R140 Abdominal distension (gaseous): Secondary | ICD-10-CM

## 2020-09-17 DIAGNOSIS — J45909 Unspecified asthma, uncomplicated: Secondary | ICD-10-CM | POA: Diagnosis present

## 2020-09-17 DIAGNOSIS — Z79899 Other long term (current) drug therapy: Secondary | ICD-10-CM

## 2020-09-17 DIAGNOSIS — Z880 Allergy status to penicillin: Secondary | ICD-10-CM

## 2020-09-17 DIAGNOSIS — Z6833 Body mass index (BMI) 33.0-33.9, adult: Secondary | ICD-10-CM

## 2020-09-17 DIAGNOSIS — I10 Essential (primary) hypertension: Secondary | ICD-10-CM | POA: Diagnosis present

## 2020-09-17 DIAGNOSIS — K250 Acute gastric ulcer with hemorrhage: Secondary | ICD-10-CM

## 2020-09-17 DIAGNOSIS — Z23 Encounter for immunization: Secondary | ICD-10-CM

## 2020-09-17 DIAGNOSIS — R1084 Generalized abdominal pain: Secondary | ICD-10-CM

## 2020-09-17 DIAGNOSIS — A040 Enteropathogenic Escherichia coli infection: Principal | ICD-10-CM

## 2020-09-17 DIAGNOSIS — A044 Other intestinal Escherichia coli infections: Secondary | ICD-10-CM

## 2020-09-17 DIAGNOSIS — Z20822 Contact with and (suspected) exposure to covid-19: Secondary | ICD-10-CM | POA: Diagnosis present

## 2020-09-17 DIAGNOSIS — A045 Campylobacter enteritis: Secondary | ICD-10-CM

## 2020-09-17 DIAGNOSIS — K921 Melena: Secondary | ICD-10-CM | POA: Insufficient documentation

## 2020-09-17 DIAGNOSIS — K529 Noninfective gastroenteritis and colitis, unspecified: Secondary | ICD-10-CM

## 2020-09-17 HISTORY — DX: Sleep apnea, unspecified: G47.30

## 2020-09-17 HISTORY — DX: Dyspnea, unspecified: R06.00

## 2020-09-17 HISTORY — DX: Unspecified osteoarthritis, unspecified site: M19.90

## 2020-09-17 HISTORY — DX: Angina pectoris, unspecified: I20.9

## 2020-09-17 LAB — CBC
HCT: 38.8 % (ref 36.0–46.0)
Hemoglobin: 12.9 g/dL (ref 12.0–15.0)
MCH: 34.4 pg — ABNORMAL HIGH (ref 26.0–34.0)
MCHC: 33.2 g/dL (ref 30.0–36.0)
MCV: 103.5 fL — ABNORMAL HIGH (ref 80.0–100.0)
Platelets: 217 10*3/uL (ref 150–400)
RBC: 3.75 MIL/uL — ABNORMAL LOW (ref 3.87–5.11)
RDW: 13.7 % (ref 11.5–15.5)
WBC: 7.3 10*3/uL (ref 4.0–10.5)
nRBC: 0 % (ref 0.0–0.2)

## 2020-09-17 LAB — GASTROINTESTINAL PANEL BY PCR, STOOL (REPLACES STOOL CULTURE)

## 2020-09-17 LAB — COMPREHENSIVE METABOLIC PANEL
ALT: 9 U/L (ref 0–44)
AST: 16 U/L (ref 15–41)
Albumin: 3.6 g/dL (ref 3.5–5.0)
Alkaline Phosphatase: 55 U/L (ref 38–126)
Anion gap: 10 (ref 5–15)
BUN: 12 mg/dL (ref 8–23)
CO2: 25 mmol/L (ref 22–32)
Calcium: 8.5 mg/dL — ABNORMAL LOW (ref 8.9–10.3)
Chloride: 103 mmol/L (ref 98–111)
Creatinine, Ser: 1.15 mg/dL — ABNORMAL HIGH (ref 0.44–1.00)
GFR, Estimated: 54 mL/min — ABNORMAL LOW (ref >60)
Glucose, Bld: 157 mg/dL — ABNORMAL HIGH (ref 70–99)
Potassium: 3.6 mmol/L (ref 3.5–5.1)
Sodium: 138 mmol/L (ref 135–145)
Total Bilirubin: 0.5 mg/dL (ref 0.3–1.2)
Total Protein: 7.5 g/dL (ref 6.5–8.1)

## 2020-09-17 LAB — TYPE AND SCREEN
ABO/RH(D): O POS
Antibody Screen: NEGATIVE

## 2020-09-17 LAB — C DIFFICILE QUICK SCREEN W PCR REFLEX
C Diff antigen: NEGATIVE
C Diff interpretation: NOT DETECTED
C Diff toxin: NEGATIVE

## 2020-09-17 LAB — RESP PANEL BY RT-PCR (FLU A&B, COVID) ARPGX2
Influenza A by PCR: NEGATIVE
Influenza B by PCR: NEGATIVE
SARS Coronavirus 2 by RT PCR: NEGATIVE

## 2020-09-17 MED ORDER — ONDANSETRON HCL 4 MG PO TABS: 4.0000 mg | ORAL_TABLET | Freq: Four times a day (QID) | ORAL | Status: DC | PRN

## 2020-09-17 MED ORDER — ONDANSETRON HCL 4 MG/2ML IJ SOLN
4.0000 mg | Freq: Four times a day (QID) | INTRAMUSCULAR | Status: DC | PRN
  Administered 2020-09-18 – 2020-09-19 (×2): 4 mg via INTRAVENOUS
  Filled 2020-09-17 (×2): qty 2

## 2020-09-17 MED ORDER — IOHEXOL 300 MG/ML  SOLN
100.0000 mL | Freq: Once | INTRAMUSCULAR | Status: AC | PRN
  Administered 2020-09-17: 100 mL via INTRAVENOUS

## 2020-09-17 MED ORDER — MORPHINE SULFATE (PF) 2 MG/ML IV SOLN
2.0000 mg | INTRAVENOUS | Status: DC | PRN
Start: 2020-09-17 — End: 2020-09-20
  Administered 2020-09-17 – 2020-09-19 (×5): 2 mg via INTRAVENOUS
  Filled 2020-09-17 (×6): qty 1

## 2020-09-17 MED ORDER — SODIUM CHLORIDE 0.9% FLUSH: 10.0000 mL | INTRAVENOUS | Status: DC | PRN

## 2020-09-17 MED ORDER — IOHEXOL 9 MG/ML PO SOLN
500.0000 mL | ORAL | Status: AC
  Administered 2020-09-17 (×2): 500 mL via ORAL

## 2020-09-17 MED ORDER — CHLORHEXIDINE GLUCONATE CLOTH 2 % EX PADS
6.0000 | MEDICATED_PAD | Freq: Every day | CUTANEOUS | Status: DC
  Administered 2020-09-19: 14:00:00 6 via TOPICAL
  Filled 2020-09-17: qty 6

## 2020-09-17 MED ORDER — MORPHINE SULFATE (PF) 2 MG/ML IV SOLN
2.0000 mg | Freq: Once | INTRAVENOUS | Status: AC
  Administered 2020-09-17: 2 mg via INTRAVENOUS
  Filled 2020-09-17: qty 1

## 2020-09-17 MED ORDER — ACETAMINOPHEN 650 MG RE SUPP: 650.0000 mg | Freq: Four times a day (QID) | RECTAL | Status: DC | PRN

## 2020-09-17 MED ORDER — PANTOPRAZOLE SODIUM 40 MG IV SOLR
40.0000 mg | Freq: Two times a day (BID) | INTRAVENOUS | Status: DC
  Administered 2020-09-18 – 2020-09-19 (×4): 40 mg via INTRAVENOUS
  Filled 2020-09-17 (×4): qty 40

## 2020-09-17 MED ORDER — SODIUM CHLORIDE 0.9 % IV BOLUS
1000.0000 mL | Freq: Once | INTRAVENOUS | Status: AC
  Administered 2020-09-17: 22:00:00 1000 mL via INTRAVENOUS

## 2020-09-17 MED ORDER — PANTOPRAZOLE SODIUM 40 MG IV SOLR: 40.0000 mg | Freq: Two times a day (BID) | INTRAVENOUS | Status: DC

## 2020-09-17 MED ORDER — ONDANSETRON HCL 4 MG/2ML IJ SOLN
4.0000 mg | Freq: Once | INTRAMUSCULAR | Status: AC
  Administered 2020-09-17: 4 mg via INTRAVENOUS
  Filled 2020-09-17: qty 2

## 2020-09-17 MED ORDER — INSULIN ASPART 100 UNIT/ML ~~LOC~~ SOLN
0.0000 [IU] | SUBCUTANEOUS | Status: DC
  Administered 2020-09-20 (×2): 2 [IU] via SUBCUTANEOUS
  Filled 2020-09-17 (×2): qty 1

## 2020-09-17 MED ORDER — PANTOPRAZOLE SODIUM 40 MG IV SOLR
40.0000 mg | Freq: Once | INTRAVENOUS | Status: AC
  Administered 2020-09-17: 40 mg via INTRAVENOUS
  Filled 2020-09-17: qty 40

## 2020-09-17 MED ORDER — SODIUM CHLORIDE 0.9 % IV SOLN: INTRAVENOUS | Status: DC

## 2020-09-17 MED ORDER — ACETAMINOPHEN 325 MG PO TABS: 650.0000 mg | ORAL_TABLET | Freq: Four times a day (QID) | ORAL | Status: DC | PRN

## 2020-09-17 NOTE — ED Notes (Signed)
IV team nurse at bedside attempting to place a midline -- NS 1L bolus infusing to 20g L AC via gravity-- pt asking for sleep aid and reports she is tired of having to get up frequently for toileting d/t frequent loose stools  -will ask for sleep aid and flexi-seal ?

## 2020-09-17 NOTE — ED Notes (Signed)
Pt has returned from CT dept via stretcher -- now back on in room commode --2nd episode of liquid stool (awaiting initial results from stool sample sent to lab earlier).  No acute changes otherwise.

## 2020-09-17 NOTE — H&P (Signed)
History and Physical    Jaime Fitzgerald QAS:[id number] DOB: 1961/04/25 DOA: 09/17/2020  PCP: [facility] [place]   Patient coming from: Home  I have personally briefly reviewed patient's old medical records in [place]  Chief Complaint: Vomiting blood, diarrhea, abdominal pain  HPI: Jaime Fitzgerald is a 59 y.o. female with medical history significant for DM, HTN, history of bleeding gastric ulcers on EGD in January 2021 requiring blood transfusions, who presents to the ER with a 3-week history of abdominal pain, vomiting and diarrhea with report of hematemesis x5 on the day prior to arrival, associated with black tarry stool .  Patient reports generalized sharp abdominal pain radiating to her back of severe intensity, unrelieved by Pepto-Bismol.  It is associated with vomiting, up to 3 times daily with inability to hold anything down.  Vomitus was initially gastric contents but was blood on the day prior to arrival.  She has been having several episodes of loose stool daily and states her stool is now watery and not helped by Pepto-Bismol.  States for the past week her stool has been dark and tarry like the last time she had bleeding back in January.  She denies fever or chills, denies cough or shortness of breath and denies dysuria. ED Course: On arrival, she was afebrile, BP 152/89, pulse 102, O2 sat 93% on room air.  Blood work significant for hemoglobin of 12.9, down from her baseline of 13.4.  Creatinine 1.15 up from baseline of 0.99.  Blood work otherwise unremarkable.  Rectal exam by ER provider yielded greenish-black stool on glove but stool guaiac negative.  GI panel revealed positive enteropathogenic E. coli and Campylobacter species EKG as reviewed by me : NSR at 95 with no acute ST-T wave changes Imaging: CT abdomen and pelvis with focal inflammation ascending colon likely inflammatory or infectious colitis  Patient was given IV Protonix.  Typed and crossed.   Hospitalist consulted for admission.   Review of Systems: As per HPI otherwise all other systems on review of systems negative.    Past Medical History:  Diagnosis Date   Acute blood loss anemia 10/11/2019   Acute GI bleeding 10/11/2019   Asthma    Diabetes mellitus without complication (HCC)    Hypertension    Seizures ([place])     Past Surgical History:  Procedure Laterality Date   CARPAL TUNNEL RELEASE     COLONOSCOPY WITH PROPOFOL N/A 01/17/2020   Procedure: COLONOSCOPY WITH PROPOFOL;  Surgeon: Lin Landsman, MD;  Location: [facility] ENDOSCOPY;  Service: Gastroenterology;  Laterality: N/A;   ESOPHAGOGASTRODUODENOSCOPY N/A 10/13/2019   Procedure: ESOPHAGOGASTRODUODENOSCOPY (EGD);  Surgeon: Virgel Manifold, MD;  Location: [facility] ENDOSCOPY;  Service: Endoscopy;  Laterality: N/A;   ESOPHAGOGASTRODUODENOSCOPY (EGD) WITH PROPOFOL N/A 10/11/2019   Procedure: ESOPHAGOGASTRODUODENOSCOPY (EGD) WITH PROPOFOL;  Surgeon: Virgel Manifold, MD;  Location: [facility] ENDOSCOPY;  Service: Endoscopy;  Laterality: N/A;   ESOPHAGOGASTRODUODENOSCOPY (EGD) WITH PROPOFOL N/A 01/17/2020   Procedure: ESOPHAGOGASTRODUODENOSCOPY (EGD) WITH PROPOFOL;  Surgeon: Lin Landsman, MD;  Location: [facility] ENDOSCOPY;  Service: Gastroenterology;  Laterality: N/A;   TONSILLECTOMY       reports that she has never smoked. She has never used smokeless tobacco. She reports current alcohol use. She reports previous drug use.  Allergies  Allergen Reactions   Aspirin Nausea Only and Other (See Comments)    GI upset   Penicillins Rash    History reviewed. No pertinent family history.    Prior to Admission  medications   Medication Sig Start Date End Date Taking? Authorizing Provider  albuterol (VENTOLIN HFA) 108 (90 Base) MCG/ACT inhaler Inhale 2 puffs into the lungs every 6 (six) hours as needed for wheezing or shortness of breath. 10/14/19   Nolberto Hanlon, MD  cloNIDine (CATAPRES) 0.1 MG tablet Take 0.1 mg by  mouth 2 (two) times daily.    [provider]  gabapentin (NEURONTIN) 300 MG capsule Take 300 mg by mouth 3 (three) times daily. 08/14/13   [provider]  glipiZIDE (GLUCOTROL XL) 10 MG 24 hr tablet Take 10 mg by mouth 2 (two) times daily.    [provider]  meclizine (ANTIVERT) 12.5 MG tablet Take 1 tablet (12.5 mg total) by mouth 3 (three) times daily as needed for up to 30 doses for dizziness. 12/11/19   Lin Landsman, MD  pantoprazole (PROTONIX) 40 MG tablet TAKE ONE TABLET BY MOUTH TWO TIMES A DAY BEFORE A MEAL 05/22/20   Vanga, Tally Due, MD  polyethylene glycol powder (GLYCOLAX/MIRALAX) 17 GM/SCOOP powder Take 17 g by mouth 2 (two) times daily. 12/11/19   Lin Landsman, MD  pravastatin (PRAVACHOL) 40 MG tablet Take 40 mg by mouth daily.    [provider]  topiramate (TOPAMAX) 100 MG tablet Take 200 mg by mouth 2 (two) times daily. 06/14/13   [provider]  vitamin B-12 (CYANOCOBALAMIN) 1000 MCG tablet TAKE ONE TABLET BY MOUTH EVERY DAY 07/23/20   Lin Landsman, MD    Physical Exam: Vitals:   09/17/20 1617 09/17/20 1620 09/17/20 1623 09/17/20 1755  BP: 136/79 140/75 (!) 152/89 (!) 152/83  Pulse: 92 97 (!) 102 95  Resp:   20 (!) 22  Temp:   98 F (36.7 C)   TempSrc:   Oral   SpO2:  93% 93% 99%  Weight:      Height:         Vitals:   09/17/20 1617 09/17/20 1620 09/17/20 1623 09/17/20 1755  BP: 136/79 140/75 (!) 152/89 (!) 152/83  Pulse: 92 97 (!) 102 95  Resp:   20 (!) 22  Temp:   98 F (36.7 C)   TempSrc:   Oral   SpO2:  93% 93% 99%  Weight:      Height:          Constitutional:  Appears unwell, lethargic, oriented x 3 .  In pain discomfort HEENT:      Head: Normocephalic and atraumatic.         Eyes: PERLA, EOMI, Conjunctivae are normal. Sclera is non-icteric.       Mouth/Throat: Mucous membranes are moist.       Neck: Supple with no signs of meningismus. Cardiovascular: Regular rate and rhythm. No  murmurs, gallops, or rubs. 2+ symmetrical distal pulses are present . No JVD. No LE edema Respiratory: Respiratory effort normal .Lungs sounds clear bilaterally. No wheezes, crackles, or rhonchi.  Gastrointestinal:  Mild tenderness on palpation in the epigastrium, and non distended with positive bowel sounds.  Genitourinary: No CVA tenderness. Musculoskeletal: Nontender with normal range of motion in all extremities. No cyanosis, or erythema of extremities. Neurologic:  Face is symmetric. Moving all extremities. No gross focal neurologic deficits . Skin: Skin is warm, dry.  No rash or ulcers Psychiatric: Mood and affect are normal    Labs on Admission: I have personally reviewed following labs and imaging studies  CBC: Recent Labs  Lab 09/17/20 1314  WBC 7.3  HGB 12.9  HCT  38.8  MCV 103.5*  PLT 297   Basic Metabolic Panel: Recent Labs  Lab 09/17/20 1314  NA 138  K 3.6  CL 103  CO2 25  GLUCOSE 157*  BUN 12  CREATININE 1.15*  CALCIUM 8.5*   GFR: Estimated Creatinine Clearance: 53.4 mL/min (A) (by C-G formula based on SCr of 1.15 mg/dL (H)). Liver Function Tests: Recent Labs  Lab 09/17/20 1314  AST 16  ALT 9  ALKPHOS 55  BILITOT 0.5  PROT 7.5  ALBUMIN 3.6   No results for input(s): LIPASE, AMYLASE in the last 168 hours. No results for input(s): AMMONIA in the last 168 hours. Coagulation Profile: No results for input(s): INR, PROTIME in the last 168 hours. Cardiac Enzymes: No results for input(s): CKTOTAL, CKMB, CKMBINDEX, TROPONINI in the last 168 hours. BNP (last 3 results) No results for input(s): PROBNP in the last 8760 hours. HbA1C: No results for input(s): HGBA1C in the last 72 hours. CBG: No results for input(s): GLUCAP in the last 168 hours. Lipid Profile: No results for input(s): CHOL, HDL, LDLCALC, TRIG, CHOLHDL, LDLDIRECT in the last 72 hours. Thyroid Function Tests: No results for input(s): TSH, T4TOTAL, FREET4, T3FREE, THYROIDAB in the last 72  hours. Anemia Panel: No results for input(s): VITAMINB12, FOLATE, FERRITIN, TIBC, IRON, RETICCTPCT in the last 72 hours. Urine analysis:    Component Value Date/Time   COLORURINE YELLOW 10/26/2010 1514   APPEARANCEUR CLOUDY (A) 10/26/2010 1514   LABSPEC 1.020 10/26/2010 1514   PHURINE 5.5 10/26/2010 1514   HGBUR SMALL (A) 10/26/2010 1514   BILIRUBINUR NEGATIVE 10/26/2010 1514   KETONESUR NEGATIVE 10/26/2010 1514   PROTEINUR NEGATIVE 10/26/2010 1514   UROBILINOGEN 0.2 10/26/2010 1514   NITRITE NEGATIVE 10/26/2010 1514   LEUKOCYTESUR NEGATIVE 10/26/2010 1514    Radiological Exams on Admission: CT ABDOMEN PELVIS W CONTRAST  Result Date: 09/17/2020 CLINICAL DATA:  Left-sided abdominal pain for 1 week. EXAM: CT ABDOMEN AND PELVIS WITH CONTRAST TECHNIQUE: Multidetector CT imaging of the abdomen and pelvis was performed using the standard protocol following bolus administration of intravenous contrast. CONTRAST:  13mL OMNIPAQUE IOHEXOL 300 MG/ML  SOLN COMPARISON:  CT scan 10/11/2019 FINDINGS: Lower chest: The lung bases are clear of an acute process. The heart is normal in size. Stable age advanced coronary artery and aortic calcifications. Hepatobiliary: No hepatic lesions or intrahepatic biliary dilatation. The gallbladder is grossly normal. No common bile duct dilatation. Pancreas: No mass, inflammation or ductal dilatation. Spleen: Normal size.  No focal lesions. Adrenals/Urinary Tract: The adrenal glands are. No renal lesions, calculi or hydronephrosis. The bladder is unremarkable. Stomach/Bowel: The stomach, duodenum and small bowel are unremarkable. No acute inflammatory changes, mass lesions or obstructive findings. The terminal ileum is unremarkable. The appendix is unremarkable. Descending colon and sigmoid colon diverticulosis without definite findings for acute diverticulitis. Focal inflammation involving the ascending colon above the ileocecal valve likely inflammatory or infectious  colitis. No mass or findings for obstruction. Vascular/Lymphatic: The aorta is normal in caliber. No dissection. Moderate age advanced atherosclerotic calcifications. The major venous structures are patent. No mesenteric or retroperitoneal mass or adenopathy. Reproductive: The uterus is surgically absent. Both ovaries are still present and appear normal. Other: No pelvic mass or adenopathy. No free pelvic fluid collections. No inguinal mass or adenopathy. No abdominal wall hernia or subcutaneous lesions. Musculoskeletal: No significant bony findings. IMPRESSION: 1. Focal inflammation involving the ascending colon above the ileocecal valve likely inflammatory or infectious colitis. 2. Diffuse descending colon and sigmoid colon diverticulosis but  no definite findings for acute diverticulitis. 3. No other significant abdominal/pelvic findings, mass lesions or adenopathy. 4. Age advanced atherosclerotic calcifications involving the aorta and coronary arteries. 5. Aortic atherosclerosis. Aortic Atherosclerosis (ICD10-I70.0). Electronically Signed   By: Marijo Sanes M.D.   On: 09/17/2020 19:02     Assessment/Plan 59 year old female with history of DM, HTN, history of bleeding gastric ulcers on EGD in January 2021 requiring blood transfusions, who presents to the ER with a 3-week history of vomiting diarrhea and abdominal pain with hematemesis x5 on 12/13 associated with black stool .    Hematemesis/?  Melena   History of gastric ulcer with hemorrhage -History of bleeding gastric ulcer in January 2019 on EGD -Hematemesis was preceded by several days of vomiting so bleeding could be related to Mallory-Weiss tear versus recurrence of bleeding gastric ulcer -5 episodes of hematemesis on 12/13 and none since. -Patient now hemodynamically stable and does not appear to be actively bleeding -Hemoglobin still robust at 12.9.  Last hemoglobin on file 13.4 -Continue serial H&H and transfuse if necessary -IV Protonix  twice daily -GI consult -iv hydration and keep n.p.o. should GI decide on procedure in the a.m.    Acute colitis/acute gastroenteritis   Enteropathogenic Escherichia coli infection   Campylobacter intestinal infection -Patient with diarrhea -GI panel with enteropathogenic E. coli and Campylobacter species -IV hydration, IV pain management, IV antiemetics -Supportive care for now    Hypertension -Labetalol as needed BP over 160/90    AKI (acute kidney injury) ([place]) -Creatinine 1.15 above baseline of 0.94 -Secondary to dehydration from acute gastroenteritis -Continue IV hydration    Type 2 diabetes mellitus (HCC) -Sliding scale insulin coverage      DVT prophylaxis: SCDs given recent hematemesis Code Status: full code  Family Communication:  none  Disposition Plan: Back to previous home environment Consults called: GI Status: At the time of admission, it appears that the appropriate admission status for this patient is INPATIENT. This is judged to be reasonable and necessary in order to provide the required intensity of service to ensure the patient's safety given the presenting symptoms, physical exam findings, and initial radiographic and laboratory data in the context of their  Comorbid conditions.   Patient requires inpatient status due to high intensity of service, high risk for further deterioration and high frequency of surveillance required.   I certify that at the point of admission it is my clinical judgment that the patient will require inpatient hospital care spanning beyond [place] MD [facility]     09/17/2020, 9:09 PM

## 2020-09-17 NOTE — ED Notes (Signed)
Late entry-- Pt awake alert and oriented x4.  Reports hematemesis with dark black stools -- pt states symptom onset approx 2 weeks pta with vomiting blood then progressed to frequent dark black stools -- pt also c/o HA and generalized abd pain rated 10/10 described as stabbing- abdomen rounded and soft.  No active vomiting since arrival however pt does continue to report nausea.  RR even and unlabored on RA.  SBP 130s-150s (pt negative for orthostatics).  Skin warm dry and intact.  20G R AC (placed by ems pta) -- reinforced with tape.  Call bell in reach -- will monitor for acute changes and maintain plan of care as pt awaits CT abd/pelvis, stool sample and type and screen.

## 2020-09-17 NOTE — ED Notes (Signed)
Dr Wendall Stade notified of positive stool results for Enteropathogenic E. Coli and Campylobacter species

## 2020-09-17 NOTE — ED Notes (Signed)
Late entry - Pt awake alert and oriented x4.  Reports generalized abd pain x2 weeks with loose dark stools and episodes of bloody emesis - liquid stool collected and sent to lab is loose but dark brown in color and rectal occult stool was negative.  Pt does report h/o GI bleeds and HgB 7.3 this visit.  RR even and unlabored on RA; CTA.  S1 and S2 regular upon auscultation.  Abdomen round but soft.  Skin warm dry and intact.  20G to R AC placed by ems -- dressing remains reinforced with tape after arrival to due to tape lifting.  Will monitor for acute changes and maintain plan of care

## 2020-09-17 NOTE — ED Notes (Signed)
IV team at bedside 

## 2020-09-17 NOTE — ED Notes (Signed)
Pt updated on plan of care for admission-- COVID swab collected and sent - results pending

## 2020-09-17 NOTE — ED Notes (Signed)
ED Provider at bedside.- nurse remains at bedside for rectal exam

## 2020-09-17 NOTE — ED Notes (Signed)
Contacted lab at this time for A1C to be added to labs collected earlier in afternoon

## 2020-09-17 NOTE — ED Provider Notes (Signed)
[facility] Emergency Department Provider Note   ____________________________________________   Event Date/Time   First MD Initiated Contact with Patient 09/17/20 1620     (approximate)  I have reviewed the triage vital signs and the nursing notes.   HISTORY  Chief Complaint GI Bleeding    HPI Jaime Fitzgerald is a 59 y.o. female who comes in complaining of upper abdominal pain and vomiting bright red blood 5 times last night.  Then she drank a whole bottle of Pepto-Bismol in an effort to feel better and has since been having black stools.  She has a history of bleeding ulcer in the past.  Currently she is having left upper abdominal pain.  She told the first nurse that she had had black tarry stools for a week.  The pain is across her whole abdomen and both sides of her belly and into the back with nausea and vomiting and diarrhea.  Pain is moderately severe.  Seems to be somewhat crampy.  Worse with palpation or sudden movements         Past Medical History:  Diagnosis Date  . Acute blood loss anemia 10/11/2019  . Acute GI bleeding 10/11/2019  . Asthma   . Diabetes mellitus without complication ([place] [address])   . Hypertension   . Seizures [facility])     Patient Active Problem List   Diagnosis Date Noted  . History of gastric ulcer with hemorrhage 09/17/2020  . Hematemesis 09/17/2020  . Enteropathogenic Escherichia coli infection 09/17/2020  . Campylobacter intestinal infection 09/17/2020  . Melena 09/17/2020  . Acute infective gastroenteritis 09/17/2020  . Acute colitis 09/17/2020  . Screening for colon cancer   . Other synovitis and tenosynovitis, unspecified hand 10/31/2019  . Diverticulosis 10/31/2019  . Migraine headache 10/31/2019  . Ovarian cyst 10/31/2019  . Pain in thoracic spine 10/31/2019  . Seizures ([place]) 10/31/2019  . Acute GI bleeding 10/11/2019  . Hypertension 10/11/2019  . AKI (acute kidney injury) ([place]) 10/11/2019  . Syncope and collapse  10/11/2019  . Type 2 diabetes mellitus ([place]) 10/11/2019  . Acute blood loss anemia 10/11/2019  . Acute gastric ulcer with hemorrhage     Past Surgical History:  Procedure Laterality Date  . CARPAL TUNNEL RELEASE    . COLONOSCOPY WITH PROPOFOL N/A 01/17/2020   Procedure: COLONOSCOPY WITH PROPOFOL;  Surgeon: Lin Landsman, MD;  Location: [facility] ENDOSCOPY;  Service: Gastroenterology;  Laterality: N/A;  . ESOPHAGOGASTRODUODENOSCOPY N/A 10/13/2019   Procedure: ESOPHAGOGASTRODUODENOSCOPY (EGD);  Surgeon: Virgel Manifold, MD;  Location: [facility] ENDOSCOPY;  Service: Endoscopy;  Laterality: N/A;  . ESOPHAGOGASTRODUODENOSCOPY (EGD) WITH PROPOFOL N/A 10/11/2019   Procedure: ESOPHAGOGASTRODUODENOSCOPY (EGD) WITH PROPOFOL;  Surgeon: Virgel Manifold, MD;  Location: [facility] ENDOSCOPY;  Service: Endoscopy;  Laterality: N/A;  . ESOPHAGOGASTRODUODENOSCOPY (EGD) WITH PROPOFOL N/A 01/17/2020   Procedure: ESOPHAGOGASTRODUODENOSCOPY (EGD) WITH PROPOFOL;  Surgeon: Lin Landsman, MD;  Location: [facility] ENDOSCOPY;  Service: Gastroenterology;  Laterality: N/A;  . TONSILLECTOMY      Prior to Admission medications   Medication Sig Start Date End Date Taking? Authorizing Provider  albuterol (VENTOLIN HFA) 108 (90 Base) MCG/ACT inhaler Inhale 2 puffs into the lungs every 6 (six) hours as needed for wheezing or shortness of breath. 10/14/19   Nolberto Hanlon, MD  cloNIDine (CATAPRES) 0.1 MG tablet Take 0.1 mg by mouth 2 (two) times daily.    [provider]  gabapentin (NEURONTIN) 300 MG capsule Take 300 mg by mouth 3 (three) times daily. 08/14/13  [provider]  glipiZIDE (GLUCOTROL XL) 10 MG 24 hr tablet Take 10 mg by mouth 2 (two) times daily.    [provider]  meclizine (ANTIVERT) 12.5 MG tablet Take 1 tablet (12.5 mg total) by mouth 3 (three) times daily as needed for up to 30 doses for dizziness. 12/11/19   Lin Landsman, MD  pantoprazole (PROTONIX) 40 MG tablet TAKE ONE TABLET  BY MOUTH TWO TIMES A DAY BEFORE A MEAL 05/22/20   Vanga, Tally Due, MD  polyethylene glycol powder (GLYCOLAX/MIRALAX) 17 GM/SCOOP powder Take 17 g by mouth 2 (two) times daily. 12/11/19   Lin Landsman, MD  pravastatin (PRAVACHOL) 40 MG tablet Take 40 mg by mouth daily.    [provider]  topiramate (TOPAMAX) 100 MG tablet Take 200 mg by mouth 2 (two) times daily. 06/14/13   [provider]  vitamin B-12 (CYANOCOBALAMIN) 1000 MCG tablet TAKE ONE TABLET BY MOUTH EVERY DAY 07/23/20   Lin Landsman, MD    Allergies Aspirin and Penicillins  History reviewed. No pertinent family history.  Social History Social History   Tobacco Use  . Smoking status: Never Smoker  . Smokeless tobacco: Never Used  Substance Use Topics  . Alcohol use: Yes  . Drug use: Not Currently    Review of Systems  Constitutional: No fever/chills Eyes: No visual changes. ENT: No sore throat. Cardiovascular: Denies chest pain. Respiratory: Denies shortness of breath. Gastrointestinal:  abdominal pain.   nausea,  vomiting.   diarrhea.  No constipation. Genitourinary: Negative for dysuria. Musculoskeletal: Negative for back pain. Skin: Negative for rash. Neurological: Negative for headaches, focal weakness   ____________________________________________   PHYSICAL EXAM:  VITAL SIGNS: ED Triage Vitals  Enc Vitals Group     BP 09/17/20 1312 129/75     Pulse Rate 09/17/20 1312 99     Resp 09/17/20 1312 19     Temp 09/17/20 1312 98.9 F (37.2 C)     Temp Source 09/17/20 1312 Oral     SpO2 09/17/20 1312 99 %     Weight 09/17/20 1312 190 lb (86.2 kg)     Height 09/17/20 1312 5\' 3"  (1.6 m)     Head Circumference --      Peak Flow --      Pain Score 09/17/20 1317 10     Pain Loc --      Pain Edu? --      Excl. in [place]? --     Constitutional: Alert and oriented.  Looks uncomfortable Eyes: Conjunctivae are normal.  Head: Atraumatic. Nose: No  congestion/rhinnorhea. Mouth/Throat: Mucous membranes are moist.  Oropharynx non-erythematous. Neck: No stridor. Cardiovascular: Normal rate, regular rhythm. Grossly normal heart sounds.  Good peripheral circulation. Respiratory: Normal respiratory effort.  No retractions. Lungs CTAB. Gastrointestinal: Soft , slightly distended diffusely tender but with the worst tenderness in the left upper quadrant and is tender to percussion there.. No abdominal bruits. No  Musculoskeletal: No lower extremity tenderness nor edema.   Neurologic:  Normal speech and language. No gross focal neurologic deficits are appreciated. . Skin:  Skin is warm, dry and intact. No rash noted. Rectal: Stool is dark but not black and not Hemoccult positive.  ____________________________________________   LABS (all labs ordered are listed, but only abnormal results are displayed)  Labs Reviewed  GASTROINTESTINAL PANEL BY PCR, STOOL (REPLACES STOOL CULTURE) - Abnormal; Notable for the following components:      Result Value   Campylobacter species DETECTED (*)  Enteropathogenic E coli (EPEC) DETECTED (*)    All other components within normal limits  COMPREHENSIVE METABOLIC PANEL - Abnormal; Notable for the following components:   Glucose, Bld 157 (*)    Creatinine, Ser 1.15 (*)    Calcium 8.5 (*)    GFR, Estimated 54 (*)    All other components within normal limits  CBC - Abnormal; Notable for the following components:   RBC 3.75 (*)    MCV 103.5 (*)    MCH 34.4 (*)    All other components within normal limits  CBG MONITORING, ED - Abnormal; Notable for the following components:   Glucose-Capillary 103 (*)    All other components within normal limits  C DIFFICILE QUICK SCREEN W PCR REFLEX  RESP PANEL BY RT-PCR (FLU A&B, COVID) ARPGX2  HEMOGLOBIN P3X  BASIC METABOLIC PANEL  CBC  TYPE AND SCREEN    ____________________________________________  EKG   ____________________________________________  RADIOLOGY Gertha Calkin, personally viewed and evaluated these images (plain radiographs) as part of my medical decision making, as well as reviewing the written report by the radiologist.  ED MD interpretation:    Official radiology report(s): CT ABDOMEN PELVIS W CONTRAST  Result Date: 09/17/2020 CLINICAL DATA:  Left-sided abdominal pain for 1 week. EXAM: CT ABDOMEN AND PELVIS WITH CONTRAST TECHNIQUE: Multidetector CT imaging of the abdomen and pelvis was performed using the standard protocol following bolus administration of intravenous contrast. CONTRAST:  176mL OMNIPAQUE IOHEXOL 300 MG/ML  SOLN COMPARISON:  CT scan 10/11/2019 FINDINGS: Lower chest: The lung bases are clear of an acute process. The heart is normal in size. Stable age advanced coronary artery and aortic calcifications. Hepatobiliary: No hepatic lesions or intrahepatic biliary dilatation. The gallbladder is grossly normal. No common bile duct dilatation. Pancreas: No mass, inflammation or ductal dilatation. Spleen: Normal size.  No focal lesions. Adrenals/Urinary Tract: The adrenal glands are. No renal lesions, calculi or hydronephrosis. The bladder is unremarkable. Stomach/Bowel: The stomach, duodenum and small bowel are unremarkable. No acute inflammatory changes, mass lesions or obstructive findings. The terminal ileum is unremarkable. The appendix is unremarkable. Descending colon and sigmoid colon diverticulosis without definite findings for acute diverticulitis. Focal inflammation involving the ascending colon above the ileocecal valve likely inflammatory or infectious colitis. No mass or findings for obstruction. Vascular/Lymphatic: The aorta is normal in caliber. No dissection. Moderate age advanced atherosclerotic calcifications. The major venous structures are patent. No mesenteric or retroperitoneal mass or  adenopathy. Reproductive: The uterus is surgically absent. Both ovaries are still present and appear normal. Other: No pelvic mass or adenopathy. No free pelvic fluid collections. No inguinal mass or adenopathy. No abdominal wall hernia or subcutaneous lesions. Musculoskeletal: No significant bony findings. IMPRESSION: 1. Focal inflammation involving the ascending colon above the ileocecal valve likely inflammatory or infectious colitis. 2. Diffuse descending colon and sigmoid colon diverticulosis but no definite findings for acute diverticulitis. 3. No other significant abdominal/pelvic findings, mass lesions or adenopathy. 4. Age advanced atherosclerotic calcifications involving the aorta and coronary arteries. 5. Aortic atherosclerosis. Aortic Atherosclerosis (ICD10-I70.0). Electronically Signed   By: Marijo Sanes M.D.   On: 09/17/2020 19:02    ____________________________________________   PROCEDURES  Procedure(s) performed (including Critical Care):  Procedures   ____________________________________________   INITIAL IMPRESSION / ASSESSMENT AND PLAN / ED COURSE    Patient is an older lady with fairly severe abdominal pain.  Additionally she has had hematemesis she reports 5 times.  Do not believe that her black stools are  due to anything by her Pepto-Bismol but she also has colitis on CT and 2 different bacterial infections.  I would like to at the very least observe her until we can get her pain under control and make sure she does not have anything more serious like mesenteric ischemia.          ____________________________________________   FINAL CLINICAL IMPRESSION(S) / ED DIAGNOSES  Final diagnoses:  Generalized abdominal pain  Colitis  Campylobacter enteritis  E. coli gastroenteritis     ED Discharge Orders    None      *Please note:  Jaime Fitzgerald was evaluated in Emergency Department on 09/18/2020 for the symptoms described in the history of present illness.  She was evaluated in the context of the global COVID-19 pandemic, which necessitated consideration that the patient might be at risk for infection with the SARS-CoV-2 virus that causes COVID-19. Institutional protocols and algorithms that pertain to the evaluation of patients at risk for COVID-19 are in a state of rapid change based on information released by regulatory bodies including the CDC and federal and state organizations. These policies and algorithms were followed during the patient's care in the ED.  Some ED evaluations and interventions may be delayed as a result of limited staffing during and the pandemic.*   Note:  This document was prepared using Dragon voice recognition software and may include unintentional dictation errors.    Nena Polio, MD 09/18/20 [phone]

## 2020-09-17 NOTE — ED Notes (Signed)
Pt now to CT dept via stretcher escorted by CT tech

## 2020-09-17 NOTE — ED Notes (Signed)
Pt reports ongoing abd pain rated 9/10-- Dr Cinda Quest made aware.  This nurse has made 2 attempts to place saline lock --unsuccessful  Attempts (FIRST ATTEMPT L UPPER ARM; 2ND ATTEMPT L HAND)

## 2020-09-17 NOTE — ED Triage Notes (Signed)
First Nurse Note:  Arrives via [facility] for ED evaluation of left sided abdominal pain and black tarry stools x 1 week.  Describes pain as sharp and radiates to shoulder blades.  VS wnl.  CBG:  152.  Also c/o N/V/D.  zofran given PTA.  20 g RAC by EMS.

## 2020-09-17 NOTE — ED Triage Notes (Signed)
Pt comes into the ED via EMS from home with c/o epigastric pain with bright red emesis last night x5 and throughout the night had multiple episode of black stools. States she has a hx of bleeding ulcer with intervention in the past.

## 2020-09-18 ENCOUNTER — Observation Stay: Payer: Medicaid Other | Admitting: Registered Nurse

## 2020-09-18 ENCOUNTER — Encounter
Admission: EM | Disposition: A | Discharge status: Home / Self Care | Payer: Self-pay | Source: Home / Self Care | Attending: Internal Medicine

## 2020-09-18 ENCOUNTER — Encounter: Payer: Self-pay | Admitting: Internal Medicine

## 2020-09-18 DIAGNOSIS — Z6833 Body mass index (BMI) 33.0-33.9, adult: Secondary | ICD-10-CM | POA: Diagnosis not present

## 2020-09-18 DIAGNOSIS — Z880 Allergy status to penicillin: Secondary | ICD-10-CM | POA: Diagnosis not present

## 2020-09-18 DIAGNOSIS — J45909 Unspecified asthma, uncomplicated: Secondary | ICD-10-CM | POA: Diagnosis present

## 2020-09-18 DIAGNOSIS — A040 Enteropathogenic Escherichia coli infection: Secondary | ICD-10-CM | POA: Diagnosis present

## 2020-09-18 DIAGNOSIS — K529 Noninfective gastroenteritis and colitis, unspecified: Secondary | ICD-10-CM

## 2020-09-18 DIAGNOSIS — I10 Essential (primary) hypertension: Secondary | ICD-10-CM | POA: Diagnosis present

## 2020-09-18 DIAGNOSIS — Z79899 Other long term (current) drug therapy: Secondary | ICD-10-CM | POA: Diagnosis not present

## 2020-09-18 DIAGNOSIS — Z20822 Contact with and (suspected) exposure to covid-19: Secondary | ICD-10-CM | POA: Diagnosis present

## 2020-09-18 DIAGNOSIS — K449 Diaphragmatic hernia without obstruction or gangrene: Secondary | ICD-10-CM | POA: Diagnosis present

## 2020-09-18 DIAGNOSIS — A09 Infectious gastroenteritis and colitis, unspecified: Secondary | ICD-10-CM | POA: Diagnosis not present

## 2020-09-18 DIAGNOSIS — K920 Hematemesis: Secondary | ICD-10-CM

## 2020-09-18 DIAGNOSIS — E669 Obesity, unspecified: Secondary | ICD-10-CM | POA: Diagnosis present

## 2020-09-18 DIAGNOSIS — Z23 Encounter for immunization: Secondary | ICD-10-CM | POA: Diagnosis not present

## 2020-09-18 DIAGNOSIS — K921 Melena: Secondary | ICD-10-CM | POA: Diagnosis present

## 2020-09-18 DIAGNOSIS — A044 Other intestinal Escherichia coli infections: Secondary | ICD-10-CM | POA: Diagnosis present

## 2020-09-18 DIAGNOSIS — A045 Campylobacter enteritis: Secondary | ICD-10-CM

## 2020-09-18 DIAGNOSIS — E860 Dehydration: Secondary | ICD-10-CM | POA: Diagnosis present

## 2020-09-18 DIAGNOSIS — E119 Type 2 diabetes mellitus without complications: Secondary | ICD-10-CM | POA: Diagnosis present

## 2020-09-18 HISTORY — PX: ESOPHAGOGASTRODUODENOSCOPY: SHX5428

## 2020-09-18 LAB — BASIC METABOLIC PANEL
Anion gap: 8 (ref 5–15)
BUN: 10 mg/dL (ref 8–23)
CO2: 25 mmol/L (ref 22–32)
Calcium: 8 mg/dL — ABNORMAL LOW (ref 8.9–10.3)
Chloride: 105 mmol/L (ref 98–111)
Creatinine, Ser: 1.02 mg/dL — ABNORMAL HIGH (ref 0.44–1.00)
GFR, Estimated: >60 mL/min (ref >60)
Glucose, Bld: 101 mg/dL — ABNORMAL HIGH (ref 70–99)
Potassium: 3.4 mmol/L — ABNORMAL LOW (ref 3.5–5.1)
Sodium: 138 mmol/L (ref 135–145)

## 2020-09-18 LAB — GLUCOSE, CAPILLARY: Glucose-Capillary: 119 mg/dL — ABNORMAL HIGH (ref 70–99)

## 2020-09-18 LAB — CBC
HCT: 34 % — ABNORMAL LOW (ref 36.0–46.0)
Hemoglobin: 11.1 g/dL — ABNORMAL LOW (ref 12.0–15.0)
MCH: 34.2 pg — ABNORMAL HIGH (ref 26.0–34.0)
MCHC: 32.6 g/dL (ref 30.0–36.0)
MCV: 104.6 fL — ABNORMAL HIGH (ref 80.0–100.0)
Platelets: 200 10*3/uL (ref 150–400)
RBC: 3.25 MIL/uL — ABNORMAL LOW (ref 3.87–5.11)
RDW: 13.7 % (ref 11.5–15.5)
WBC: 7.7 10*3/uL (ref 4.0–10.5)
nRBC: 0 % (ref 0.0–0.2)

## 2020-09-18 LAB — CBG MONITORING, ED
Glucose-Capillary: 103 mg/dL — ABNORMAL HIGH (ref 70–99)
Glucose-Capillary: 104 mg/dL — ABNORMAL HIGH (ref 70–99)
Glucose-Capillary: 113 mg/dL — ABNORMAL HIGH (ref 70–99)
Glucose-Capillary: 82 mg/dL (ref 70–99)
Glucose-Capillary: 96 mg/dL (ref 70–99)

## 2020-09-18 LAB — HEMOGLOBIN A1C
Hgb A1c MFr Bld: 6.4 % — ABNORMAL HIGH (ref 4.8–5.6)
Mean Plasma Glucose: 136.98 mg/dL

## 2020-09-18 SURGERY — EGD (ESOPHAGOGASTRODUODENOSCOPY)
Anesthesia: General

## 2020-09-18 MED ORDER — PROPOFOL 10 MG/ML IV BOLUS
INTRAVENOUS | Status: AC
  Filled 2020-09-18: qty 20

## 2020-09-18 MED ORDER — SUCCINYLCHOLINE CHLORIDE 20 MG/ML IJ SOLN
INTRAMUSCULAR | Status: DC | PRN
  Administered 2020-09-18: 100 mg via INTRAVENOUS

## 2020-09-18 MED ORDER — LIDOCAINE HCL (CARDIAC) PF 100 MG/5ML IV SOSY
PREFILLED_SYRINGE | INTRAVENOUS | Status: DC | PRN
  Administered 2020-09-18: 100 mg via INTRAVENOUS

## 2020-09-18 MED ORDER — PROPOFOL 500 MG/50ML IV EMUL
INTRAVENOUS | Status: AC
  Filled 2020-09-18: qty 50

## 2020-09-18 MED ORDER — SUCCINYLCHOLINE CHLORIDE 200 MG/10ML IV SOSY
PREFILLED_SYRINGE | INTRAVENOUS | Status: AC
  Filled 2020-09-18: qty 10

## 2020-09-18 MED ORDER — INFLUENZA VAC SPLIT QUAD 0.5 ML IM SUSY
0.5000 mL | PREFILLED_SYRINGE | INTRAMUSCULAR | Status: AC
  Administered 2020-09-19: 09:00:00 0.5 mL via INTRAMUSCULAR
  Filled 2020-09-18: qty 0.5

## 2020-09-18 MED ORDER — PROPOFOL 10 MG/ML IV BOLUS
INTRAVENOUS | Status: DC | PRN
  Administered 2020-09-18: 100 mg via INTRAVENOUS

## 2020-09-18 MED ORDER — ONDANSETRON HCL 4 MG/2ML IJ SOLN
INTRAMUSCULAR | Status: AC
  Filled 2020-09-18: qty 2

## 2020-09-18 MED ORDER — FENTANYL CITRATE (PF) 100 MCG/2ML IJ SOLN: 25.0000 ug | INTRAMUSCULAR | Status: DC | PRN

## 2020-09-18 MED ORDER — MELATONIN 5 MG PO TABS
5.0000 mg | ORAL_TABLET | Freq: Every evening | ORAL | Status: DC | PRN
  Administered 2020-09-19: 21:00:00 5 mg via ORAL
  Filled 2020-09-18: qty 1

## 2020-09-18 MED ORDER — HYDROCODONE-ACETAMINOPHEN 5-325 MG PO TABS
1.0000 | ORAL_TABLET | Freq: Four times a day (QID) | ORAL | Status: DC | PRN
  Administered 2020-09-18 – 2020-09-20 (×4): 2 via ORAL
  Filled 2020-09-18 (×4): qty 2

## 2020-09-18 MED ORDER — SODIUM CHLORIDE 0.9 % IV SOLN
INTRAVENOUS | Status: DC
  Administered 2020-09-18: 15:00:00 1000 mL via INTRAVENOUS

## 2020-09-18 MED ORDER — LIDOCAINE HCL (PF) 2 % IJ SOLN
INTRAMUSCULAR | Status: AC
  Filled 2020-09-18: qty 5

## 2020-09-18 MED ORDER — ONDANSETRON HCL 4 MG/2ML IJ SOLN: 4.0000 mg | Freq: Once | INTRAMUSCULAR | Status: DC | PRN

## 2020-09-18 NOTE — ED Notes (Signed)
Taken for EGD

## 2020-09-18 NOTE — Transfer of Care (Signed)
Immediate Anesthesia Transfer of Care Note  Patient: CAM HARNDEN  Procedure(s) Performed: ESOPHAGOGASTRODUODENOSCOPY (EGD) (N/A )  Patient Location: PACU  Anesthesia Type:General  Level of Consciousness: sedated  Airway & Oxygen Therapy: Patient Spontanous Breathing and Patient connected to nasal cannula oxygen  Post-op Assessment: Report given to RN and Post -op Vital signs reviewed and stable  Post vital signs: Reviewed and stable  Last Vitals:  Vitals Value Taken Time  BP 125/63 09/18/20 1548  Temp 36.1 C 09/18/20 1555  Pulse 86 09/18/20 1556  Resp 10 09/18/20 1556  SpO2 99 % 09/18/20 1556  Vitals shown include unvalidated device data.  Last Pain:  Vitals:   09/18/20 1555  TempSrc:   PainSc: 0-No pain      Patients Stated Pain Goal: 0 (72/89/79 1504)  Complications: No complications documented.

## 2020-09-18 NOTE — Consult Note (Signed)
GI Inpatient Consult Note  Reason for Consult: Hematemesis, melena, epigastric abd pain   Attending Requesting Consult: Dr. Judd Gaudier   History of Present Illness: Jaime Fitzgerald is a 59 y.o. female seen for evaluation of melena, hematemesis, and acute gastroenteritis  at the request of Dr. Judd Gaudier. Pt has a PMH of HTN, diabetes mellitus, and Hx of bleeding gastric ulcers seen on EGD 10/2019 requiring blood transfusions. She presented to the [facility] ED last night for 3-week history of progressive nausea, vomiting, and diarrhea with hematemesis x5 two days ago and associated black, tarry stool. She reports these symptoms are almost exactly like what she experienced in January of this year where she was admitted for acute blood loss anemia 2/2 bleeding gastric ulcers requiring blood transfusion. She reports over the past week she has been dealing with black, tarry stools. She has struggled to keep any food or liquid down. She reports she had five episodes of coffee-ground emesis the day before presenting to the ED. She reports that she was having numerous liquid bowel movements with a black, tarry consistency and sometimes just like water was gushing out of her. She has noticed abdominal bloating and epigastric pain described as sharp and dull. Pain is centralized in her epigastrium and reports it cuts through to her back. She has taken Pepto-Bismol for her symptoms with no relief. She reports she noticed the black, tarry stool prior to starting Pepto-Bismol. She got really scared and decided to come to the ED. Upon presentation to the ED, hemoglobin was 12.9 which was down from her baseline of 13.4 earlier this year. Per ED physician, rectal exam showed greenish-black stool which was guaiac negative. CT abd/pelvis showed focal inflammation in ascending colon, likely inflammatory of infectious colitis. She had stool studies done which showed positive EPEC and positive Campylobacter. She was started on  Protonix IV BID and given IV fluid hydration. She was admitted January 2021 for hematemesis where she had drop in hemoglobin from 14.09 April 2019 to 6.9 requiring blood transfusion. She was seen by Dr. Bonna Gains as consult where she underwent EGDs x2 for UGI bleed. She had bleeding gastric ulcers that were treated with cautery and APC. Repeat EGD 48 hours later showed no active bleeding. She followed up with Dr. Marius Ditch and had EGD and colonoscopy performed 01/17/2020 with confirmed healing of gastric ulcers. She denies any frequent NSAID use. Hemoglobin this morning 11.1. She reports her last BM was 1-2 hours ago and was "jet black and tarry."   Last Colonoscopy:  01/17/2020 (Dr. Marius Ditch) - Diverticulosis in the sigmoid colon and in the descending colon. - One diminutive polyp in the ascending colon, removed with a cold biopsy forceps. Resected and retrieved. Path showing TA. - The distal rectum and anal verge are normal on retroflexion view.  Last Endoscopy:   10/11/2019: (Dr. Bonna Gains) - Normal esophagus. - Red blood in the entire stomach. - Oozing gastric ulcers treated with APC - Blood in the duodenal bulb. - Normal second portion of the duodenum. - No specimens collected.  10/13/2019: (Dr. Bonna Gains) - Normal esophagus. - Three non-bleeding gastric ulcers were found in the gastric fundus and in the gastric body. The largest lesion was 8 mm in largest dimension. 2 of the ulcers were clean based. 1 one of them was the one previously treated with APC this week and did not have any further bleeding. - Normal duodenal bulb, second portion of the duodenum and examined duodenum. - No specimens collected.  01/17/2020 (  Dr. Marius Ditch) - Normal duodenal bulb and second portion of the duodenum. - A large amount of food (residue) in the stomach. - Normal incisura and antrum. - Normal esophagus. - No specimens collected.   Past Medical History:  Past Medical History:  Diagnosis Date  . Acute  blood loss anemia 10/11/2019  . Acute GI bleeding 10/11/2019  . Asthma   . Diabetes mellitus without complication ([place])   . Hypertension   . Seizures ([place])     Problem List: Patient Active Problem List   Diagnosis Date Noted  . History of gastric ulcer with hemorrhage 09/17/2020  . Hematemesis 09/17/2020  . Enteropathogenic Escherichia coli infection 09/17/2020  . Campylobacter intestinal infection 09/17/2020  . Melena 09/17/2020  . Acute infective gastroenteritis 09/17/2020  . Acute colitis 09/17/2020  . Screening for colon cancer   . Other synovitis and tenosynovitis, unspecified hand 10/31/2019  . Diverticulosis 10/31/2019  . Migraine headache 10/31/2019  . Ovarian cyst 10/31/2019  . Pain in thoracic spine 10/31/2019  . Seizures ([place]) 10/31/2019  . Acute GI bleeding 10/11/2019  . Hypertension 10/11/2019  . AKI (acute kidney injury) ([place]) 10/11/2019  . Syncope and collapse 10/11/2019  . Type 2 diabetes mellitus ([place]) 10/11/2019  . Acute blood loss anemia 10/11/2019  . Acute gastric ulcer with hemorrhage     Past Surgical History: Past Surgical History:  Procedure Laterality Date  . CARPAL TUNNEL RELEASE    . COLONOSCOPY WITH PROPOFOL N/A 01/17/2020   Procedure: COLONOSCOPY WITH PROPOFOL;  Surgeon: Lin Landsman, MD;  Location: [facility] ENDOSCOPY;  Service: Gastroenterology;  Laterality: N/A;  . ESOPHAGOGASTRODUODENOSCOPY N/A 10/13/2019   Procedure: ESOPHAGOGASTRODUODENOSCOPY (EGD);  Surgeon: Virgel Manifold, MD;  Location: [facility] ENDOSCOPY;  Service: Endoscopy;  Laterality: N/A;  . ESOPHAGOGASTRODUODENOSCOPY (EGD) WITH PROPOFOL N/A 10/11/2019   Procedure: ESOPHAGOGASTRODUODENOSCOPY (EGD) WITH PROPOFOL;  Surgeon: Virgel Manifold, MD;  Location: [facility] ENDOSCOPY;  Service: Endoscopy;  Laterality: N/A;  . ESOPHAGOGASTRODUODENOSCOPY (EGD) WITH PROPOFOL N/A 01/17/2020   Procedure: ESOPHAGOGASTRODUODENOSCOPY (EGD) WITH PROPOFOL;  Surgeon: Lin Landsman, MD;  Location: [facility]  ENDOSCOPY;  Service: Gastroenterology;  Laterality: N/A;  . TONSILLECTOMY      Allergies: Allergies  Allergen Reactions  . Aspirin Nausea Only and Other (See Comments)    GI upset  . Penicillins Rash    Home Medications: (Not in a hospital admission)  Home medication reconciliation was completed with the patient.   Scheduled Inpatient Medications:   . Chlorhexidine Gluconate Cloth  6 each Topical Daily  . insulin aspart  0-15 Units Subcutaneous Q4H  . pantoprazole (PROTONIX) IV  40 mg Intravenous Q12H    Continuous Inpatient Infusions:   . sodium chloride 100 mL/hr at 09/18/20 0820    PRN Inpatient Medications:  acetaminophen **OR** acetaminophen, melatonin, morphine injection, ondansetron **OR** ondansetron (ZOFRAN) IV, sodium chloride flush  Family History: family history is not on file.  The patient's family history is negative for inflammatory bowel disorders, GI malignancy, or solid organ transplantation.  Social History:   reports that she has never smoked. She has never used smokeless tobacco. She reports current alcohol use. She reports previous drug use. The patient denies ETOH, tobacco, or drug use.   Review of Systems: Constitutional: Weight is stable.  Eyes: No changes in vision. ENT: No oral lesions, sore throat.  GI: see HPI.  Heme/Lymph: No easy bruising.  CV: No chest pain.  GU: No hematuria.  Integumentary: No rashes.  Neuro: No headaches.  Psych: No depression/anxiety.  Endocrine: No  heat/cold intolerance.  Allergic/Immunologic: No urticaria.  Resp: No cough, SOB.  Musculoskeletal: No joint swelling.    Physical Examination: BP 120/74 (BP Location: Right Wrist)   Pulse 89   Temp 98.9 F (37.2 C) (Oral)   Resp 20   Ht 5\' 3"  (1.6 m)   Wt 86.2 kg   SpO2 96%   BMI 33.66 kg/m  Gen: NAD, alert and oriented x 4 HEENT: PEERLA, EOMI, Neck: supple, no JVD or thyromegaly Chest: CTA bilaterally, no wheezes, crackles, or other adventitious  sounds CV: RRR, no m/g/c/r Abd: soft, mildly distended, hypoactive BS in all four quadrants; tenderness to light and deep palpation in bilateral upper abdomen worse in epigastrium, no HSM, guarding, ridigity, or rebound tenderness, no tenderness out of proportion to the exam Ext: no edema, well perfused with 2+ pulses, Skin: no rash or lesions noted Lymph: no LAD  Data: Lab Results  Component Value Date   WBC 7.7 09/18/2020   HGB 11.1 (L) 09/18/2020   HCT 34.0 (L) 09/18/2020   MCV 104.6 (H) 09/18/2020   PLT 200 09/18/2020   Recent Labs  Lab 09/17/20 1314 09/18/20 0008  HGB 12.9 11.1*   Lab Results  Component Value Date   NA 138 09/18/2020   K 3.4 (L) 09/18/2020   CL 105 09/18/2020   CO2 25 09/18/2020   BUN 10 09/18/2020   CREATININE 1.02 (H) 09/18/2020   Lab Results  Component Value Date   ALT 9 09/17/2020   AST 16 09/17/2020   ALKPHOS 55 09/17/2020   BILITOT 0.5 09/17/2020   No results for input(s): APTT, INR, PTT in the last 168 hours. Assessment/Plan:  59 y/o AA female with a PMH of HTN, diabetes mellitus, Hx of bleeding gastric ulcers on EGD 10/2019 presented to the [facility] ED for 3-week history of abdominal pain, vomiting with hematemesis, and diarrhea  1. Epigastric abdominal pain with nausea/vomiting  2. Hematemesis - described as coffee-ground emesis, x5 episodes on 12/13 prior to ED evaluation  3. Melena - concern for UGI bleed given history and clinical presentation. Melena and coffee-ground emesis along with epigastric abdominal pain are consistent with peptic etiologies such as peptic ulcer disease +/- H pylori, gastritis, duodenitis, etc. Other DDx includes Mallory-Weiss tear, erosive esophagitis, PHG, AVMs, or small bowel bleeding. H&H stable. Hgb 11.1 this morning decreased from baseline 13.4.   4. Hx of bleeding gastric ulcers - s/p hospitalization for acute blood loss anemia requiring blood transfusion and EGD for endoscopic hemostasis with cautery and  APC  5. Acute gastroenteritis - GI PCR positive for EPEC and Campylobacter  6. Acute Kidney Injury - creatinine 1.15 above baseline of 0.94, likely 2/2 dehydration from #5 versus UGIB  Recommendations:  - Hemodynamically stable with no precipitous drop in hemoglobin which is reassuring. Continue to monitor H&H closely and transfuse for hemoglobin <7.0. - Continue IV acid suppression - Agree with supportive care for treatment of acute bacterial gastroenteritis with anti-emetics, IV pain pain control, and IV fluid hydration - She is reporting ongoing melena and epigastric abd pain. Along with her reported recent history of coffee-ground emesis, there is concern for upper GI bleeding, most likely peptic ulcer disease - Advise EGD for endoluminal evaluation and potential hemostasis - Keep NPO - Plan for EGD this afternoon with Dr. Alice Reichert - See procedure note for findings and further recommendations   I reviewed the risks (including bleeding, perforation, infection, anesthesia complications, cardiac/respiratory complications), benefits and alternatives of EGD. Patient consents to proceed.  Thank you for the consult. Please call with questions or concerns.  Reeves Forth [facility] [phone] (Cell)

## 2020-09-18 NOTE — Hospital Course (Signed)
From H&P by Dr. Damita Dunnings: "Jaime Fitzgerald is a 59 y.o. female with medical history significant for DM, HTN, history of bleeding gastric ulcers on EGD in January 2021 requiring blood transfusions, who presents to the ER with a 3-week history of abdominal pain, vomiting and diarrhea with report of hematemesis x5 on the day prior to arrival, associated with black tarry stool .  Patient reports generalized sharp abdominal pain radiating to her back of severe intensity, unrelieved by Pepto-Bismol.  It is associated with vomiting, up to 3 times daily with inability to hold anything down.  Vomitus was initially gastric contents but was blood on the day prior to arrival.  She has been having several episodes of loose stool daily and states her stool is now watery and not helped by Pepto-Bismol.  States for the past week her stool has been dark and tarry like the last time she had bleeding back in January.  She denies fever or chills, denies cough or shortness of breath and denies dysuria. ED Course: On arrival, she was afebrile, BP 152/89, pulse 102, O2 sat 93% on room air.  Blood work significant for hemoglobin of 12.9, down from her baseline of 13.4.  Creatinine 1.15 up from baseline of 0.99.  Blood work otherwise unremarkable.  Rectal exam by ER provider yielded greenish-black stool on glove but stool guaiac negative.  GI panel revealed positive enteropathogenic E. coli and Campylobacter species EKG as reviewed by me : NSR at 95 with no acute ST-T wave changes Imaging: CT abdomen and pelvis with focal inflammation ascending colon likely inflammatory or infectious colitis   Patient was given IV Protonix.  Typed and crossed.  Hospitalist consulted for admission."

## 2020-09-18 NOTE — ED Notes (Addendum)
Pt assisted to toilet in rm. Pt had a BM. Pt stated "it was tar black." Pt c/o abd pain and is asking for pain meds. Will inform RN. Pt placed back on cardiac monitor. Call light within reach. Pt has no further needs at this time.

## 2020-09-18 NOTE — Anesthesia Preprocedure Evaluation (Addendum)
Anesthesia Evaluation  Patient identified by MRN, date of birth, ID band Patient awake    Reviewed: Allergy & Precautions, H&P , NPO status , Patient's Chart, lab work & pertinent test results, reviewed documented beta blocker date and time   Airway Mallampati: II   Neck ROM: full    Dental  (+) Poor Dentition   Pulmonary shortness of breath, asthma , sleep apnea ,    Pulmonary exam normal        Cardiovascular Exercise Tolerance: Poor hypertension, On Medications negative cardio ROS Normal cardiovascular exam Rhythm:regular Rate:Normal     Neuro/Psych  Headaches, Seizures -,  negative psych ROS   GI/Hepatic Neg liver ROS, PUD,   Endo/Other  negative endocrine ROSdiabetes  Renal/GU Renal disease  negative genitourinary   Musculoskeletal   Abdominal   Peds  Hematology  (+) Blood dyscrasia, anemia ,   Anesthesia Other Findings Hematemesis   Past Medical History: 10/11/2019: Acute blood loss anemia 10/11/2019: Acute GI bleeding No date: Asthma No date: Diabetes mellitus without complication ([place]) No date: Hypertension No date: Seizures [facility]) Past Surgical History: No date: CARPAL TUNNEL RELEASE 01/17/2020: COLONOSCOPY WITH PROPOFOL; N/A     Comment:  Procedure: COLONOSCOPY WITH PROPOFOL;  Surgeon: Lin Landsman, MD;  Location: [facility] ENDOSCOPY;  Service:               Gastroenterology;  Laterality: N/A; 10/13/2019: ESOPHAGOGASTRODUODENOSCOPY; N/A     Comment:  Procedure: ESOPHAGOGASTRODUODENOSCOPY (EGD);  Surgeon:               Virgel Manifold, MD;  Location: [facility] ENDOSCOPY;                Service: Endoscopy;  Laterality: N/A; 10/11/2019: ESOPHAGOGASTRODUODENOSCOPY (EGD) WITH PROPOFOL; N/A     Comment:  Procedure: ESOPHAGOGASTRODUODENOSCOPY (EGD) WITH               PROPOFOL;  Surgeon: Virgel Manifold, MD;  Location:               [facility] ENDOSCOPY;  Service: Endoscopy;  Laterality:  N/A; 01/17/2020: ESOPHAGOGASTRODUODENOSCOPY (EGD) WITH PROPOFOL; N/A     Comment:  Procedure: ESOPHAGOGASTRODUODENOSCOPY (EGD) WITH               PROPOFOL;  Surgeon: Lin Landsman, MD;  Location:               [facility] ENDOSCOPY;  Service: Gastroenterology;  Laterality:               N/A; No date: TONSILLECTOMY BMI    Body Mass Index: 33.66 kg/m     Reproductive/Obstetrics negative OB ROS                            Anesthesia Physical Anesthesia Plan  ASA: III and emergent  Anesthesia Plan: General ETT and Rapid Sequence   Post-op Pain Management:    Induction:   PONV Risk Score and Plan:   Airway Management Planned:   Additional Equipment:   Intra-op Plan:   Post-operative Plan:   Informed Consent: I have reviewed the patients History and Physical, chart, labs and discussed the procedure including the risks, benefits and alternatives for the proposed anesthesia with the patient or authorized representative who has indicated his/her understanding and acceptance.     Dental Advisory Given  Plan Discussed with: CRNA and Anesthesiologist  Anesthesia  Plan Comments: (Emergency procedure per Dr. Alice Reichert)       Anesthesia Quick Evaluation

## 2020-09-18 NOTE — Progress Notes (Signed)
Discussed EGD with patient who understands the nature of procedure, indications and risks and potential complications and wishes to proceed. She is somewhat somnolent, but wants to "find out where the blood is coming from and stop it".  Procedure is being done EMERGENTLY and therefore will need to be done regardless of morphine being given to the patient less than one hour ago.Delay in care may portend a bad outcome and is therefore not advised.   Robet Leu, M.D. ABIM Diplomate in Gastroenterology [place]

## 2020-09-18 NOTE — H&P (View-Only) (Signed)
GI Inpatient Consult Note  Reason for Consult: Hematemesis, melena, epigastric abd pain   Attending Requesting Consult: Dr. Judd Gaudier   History of Present Illness: Jaime Fitzgerald is a 59 y.o. female seen for evaluation of melena, hematemesis, and acute gastroenteritis  at the request of Dr. Judd Gaudier. Pt has a PMH of HTN, diabetes mellitus, and Hx of bleeding gastric ulcers seen on EGD 10/2019 requiring blood transfusions. She presented to the [facility] ED last night for 3-week history of progressive nausea, vomiting, and diarrhea with hematemesis x5 two days ago and associated black, tarry stool. She reports these symptoms are almost exactly like what she experienced in January of this year where she was admitted for acute blood loss anemia 2/2 bleeding gastric ulcers requiring blood transfusion. She reports over the past week she has been dealing with black, tarry stools. She has struggled to keep any food or liquid down. She reports she had five episodes of coffee-ground emesis the day before presenting to the ED. She reports that she was having numerous liquid bowel movements with a black, tarry consistency and sometimes just like water was gushing out of her. She has noticed abdominal bloating and epigastric pain described as sharp and dull. Pain is centralized in her epigastrium and reports it cuts through to her back. She has taken Pepto-Bismol for her symptoms with no relief. She reports she noticed the black, tarry stool prior to starting Pepto-Bismol. She got really scared and decided to come to the ED. Upon presentation to the ED, hemoglobin was 12.9 which was down from her baseline of 13.4 earlier this year. Per ED physician, rectal exam showed greenish-black stool which was guaiac negative. CT abd/pelvis showed focal inflammation in ascending colon, likely inflammatory of infectious colitis. She had stool studies done which showed positive EPEC and positive Campylobacter. She was started on  Protonix IV BID and given IV fluid hydration. She was admitted January 2021 for hematemesis where she had drop in hemoglobin from 14.09 April 2019 to 6.9 requiring blood transfusion. She was seen by Dr. Bonna Gains as consult where she underwent EGDs x2 for UGI bleed. She had bleeding gastric ulcers that were treated with cautery and APC. Repeat EGD 48 hours later showed no active bleeding. She followed up with Dr. Marius Ditch and had EGD and colonoscopy performed 01/17/2020 with confirmed healing of gastric ulcers. She denies any frequent NSAID use. Hemoglobin this morning 11.1. She reports her last BM was 1-2 hours ago and was "jet black and tarry."   Last Colonoscopy:  01/17/2020 (Dr. Marius Ditch) - Diverticulosis in the sigmoid colon and in the descending colon. - One diminutive polyp in the ascending colon, removed with a cold biopsy forceps. Resected and retrieved. Path showing TA. - The distal rectum and anal verge are normal on retroflexion view.  Last Endoscopy:   10/11/2019: (Dr. Bonna Gains) - Normal esophagus. - Red blood in the entire stomach. - Oozing gastric ulcers treated with APC - Blood in the duodenal bulb. - Normal second portion of the duodenum. - No specimens collected.  10/13/2019: (Dr. Bonna Gains) - Normal esophagus. - Three non-bleeding gastric ulcers were found in the gastric fundus and in the gastric body. The largest lesion was 8 mm in largest dimension. 2 of the ulcers were clean based. 1 one of them was the one previously treated with APC this week and did not have any further bleeding. - Normal duodenal bulb, second portion of the duodenum and examined duodenum. - No specimens collected.  01/17/2020 (  Dr. Marius Ditch) - Normal duodenal bulb and second portion of the duodenum. - A large amount of food (residue) in the stomach. - Normal incisura and antrum. - Normal esophagus. - No specimens collected.   Past Medical History:  Past Medical History:  Diagnosis Date  . Acute  blood loss anemia 10/11/2019  . Acute GI bleeding 10/11/2019  . Asthma   . Diabetes mellitus without complication ([place])   . Hypertension   . Seizures ([place])     Problem List: Patient Active Problem List   Diagnosis Date Noted  . History of gastric ulcer with hemorrhage 09/17/2020  . Hematemesis 09/17/2020  . Enteropathogenic Escherichia coli infection 09/17/2020  . Campylobacter intestinal infection 09/17/2020  . Melena 09/17/2020  . Acute infective gastroenteritis 09/17/2020  . Acute colitis 09/17/2020  . Screening for colon cancer   . Other synovitis and tenosynovitis, unspecified hand 10/31/2019  . Diverticulosis 10/31/2019  . Migraine headache 10/31/2019  . Ovarian cyst 10/31/2019  . Pain in thoracic spine 10/31/2019  . Seizures ([place]) 10/31/2019  . Acute GI bleeding 10/11/2019  . Hypertension 10/11/2019  . AKI (acute kidney injury) ([place]) 10/11/2019  . Syncope and collapse 10/11/2019  . Type 2 diabetes mellitus ([place]) 10/11/2019  . Acute blood loss anemia 10/11/2019  . Acute gastric ulcer with hemorrhage     Past Surgical History: Past Surgical History:  Procedure Laterality Date  . CARPAL TUNNEL RELEASE    . COLONOSCOPY WITH PROPOFOL N/A 01/17/2020   Procedure: COLONOSCOPY WITH PROPOFOL;  Surgeon: Lin Landsman, MD;  Location: [facility] ENDOSCOPY;  Service: Gastroenterology;  Laterality: N/A;  . ESOPHAGOGASTRODUODENOSCOPY N/A 10/13/2019   Procedure: ESOPHAGOGASTRODUODENOSCOPY (EGD);  Surgeon: Virgel Manifold, MD;  Location: [facility] ENDOSCOPY;  Service: Endoscopy;  Laterality: N/A;  . ESOPHAGOGASTRODUODENOSCOPY (EGD) WITH PROPOFOL N/A 10/11/2019   Procedure: ESOPHAGOGASTRODUODENOSCOPY (EGD) WITH PROPOFOL;  Surgeon: Virgel Manifold, MD;  Location: [facility] ENDOSCOPY;  Service: Endoscopy;  Laterality: N/A;  . ESOPHAGOGASTRODUODENOSCOPY (EGD) WITH PROPOFOL N/A 01/17/2020   Procedure: ESOPHAGOGASTRODUODENOSCOPY (EGD) WITH PROPOFOL;  Surgeon: Lin Landsman, MD;  Location: [facility]  ENDOSCOPY;  Service: Gastroenterology;  Laterality: N/A;  . TONSILLECTOMY      Allergies: Allergies  Allergen Reactions  . Aspirin Nausea Only and Other (See Comments)    GI upset  . Penicillins Rash    Home Medications: (Not in a hospital admission)  Home medication reconciliation was completed with the patient.   Scheduled Inpatient Medications:   . Chlorhexidine Gluconate Cloth  6 each Topical Daily  . insulin aspart  0-15 Units Subcutaneous Q4H  . pantoprazole (PROTONIX) IV  40 mg Intravenous Q12H    Continuous Inpatient Infusions:   . sodium chloride 100 mL/hr at 09/18/20 0820    PRN Inpatient Medications:  acetaminophen **OR** acetaminophen, melatonin, morphine injection, ondansetron **OR** ondansetron (ZOFRAN) IV, sodium chloride flush  Family History: family history is not on file.  The patient's family history is negative for inflammatory bowel disorders, GI malignancy, or solid organ transplantation.  Social History:   reports that she has never smoked. She has never used smokeless tobacco. She reports current alcohol use. She reports previous drug use. The patient denies ETOH, tobacco, or drug use.   Review of Systems: Constitutional: Weight is stable.  Eyes: No changes in vision. ENT: No oral lesions, sore throat.  GI: see HPI.  Heme/Lymph: No easy bruising.  CV: No chest pain.  GU: No hematuria.  Integumentary: No rashes.  Neuro: No headaches.  Psych: No depression/anxiety.  Endocrine: No  heat/cold intolerance.  Allergic/Immunologic: No urticaria.  Resp: No cough, SOB.  Musculoskeletal: No joint swelling.    Physical Examination: BP 120/74 (BP Location: Right Wrist)   Pulse 89   Temp 98.9 F (37.2 C) (Oral)   Resp 20   Ht 5\' 3"  (1.6 m)   Wt 86.2 kg   SpO2 96%   BMI 33.66 kg/m  Gen: NAD, alert and oriented x 4 HEENT: PEERLA, EOMI, Neck: supple, no JVD or thyromegaly Chest: CTA bilaterally, no wheezes, crackles, or other adventitious  sounds CV: RRR, no m/g/c/r Abd: soft, mildly distended, hypoactive BS in all four quadrants; tenderness to light and deep palpation in bilateral upper abdomen worse in epigastrium, no HSM, guarding, ridigity, or rebound tenderness, no tenderness out of proportion to the exam Ext: no edema, well perfused with 2+ pulses, Skin: no rash or lesions noted Lymph: no LAD  Data: Lab Results  Component Value Date   WBC 7.7 09/18/2020   HGB 11.1 (L) 09/18/2020   HCT 34.0 (L) 09/18/2020   MCV 104.6 (H) 09/18/2020   PLT 200 09/18/2020   Recent Labs  Lab 09/17/20 1314 09/18/20 0008  HGB 12.9 11.1*   Lab Results  Component Value Date   NA 138 09/18/2020   K 3.4 (L) 09/18/2020   CL 105 09/18/2020   CO2 25 09/18/2020   BUN 10 09/18/2020   CREATININE 1.02 (H) 09/18/2020   Lab Results  Component Value Date   ALT 9 09/17/2020   AST 16 09/17/2020   ALKPHOS 55 09/17/2020   BILITOT 0.5 09/17/2020   No results for input(s): APTT, INR, PTT in the last 168 hours. Assessment/Plan:  59 y/o AA female with a PMH of HTN, diabetes mellitus, Hx of bleeding gastric ulcers on EGD 10/2019 presented to the [facility] ED for 3-week history of abdominal pain, vomiting with hematemesis, and diarrhea  1. Epigastric abdominal pain with nausea/vomiting  2. Hematemesis - described as coffee-ground emesis, x5 episodes on 12/13 prior to ED evaluation  3. Melena - concern for UGI bleed given history and clinical presentation. Melena and coffee-ground emesis along with epigastric abdominal pain are consistent with peptic etiologies such as peptic ulcer disease +/- H pylori, gastritis, duodenitis, etc. Other DDx includes Mallory-Weiss tear, erosive esophagitis, PHG, AVMs, or small bowel bleeding. H&H stable. Hgb 11.1 this morning decreased from baseline 13.4.   4. Hx of bleeding gastric ulcers - s/p hospitalization for acute blood loss anemia requiring blood transfusion and EGD for endoscopic hemostasis with cautery and  APC  5. Acute gastroenteritis - GI PCR positive for EPEC and Campylobacter  6. Acute Kidney Injury - creatinine 1.15 above baseline of 0.94, likely 2/2 dehydration from #5 versus UGIB  Recommendations:  - Hemodynamically stable with no precipitous drop in hemoglobin which is reassuring. Continue to monitor H&H closely and transfuse for hemoglobin <7.0. - Continue IV acid suppression - Agree with supportive care for treatment of acute bacterial gastroenteritis with anti-emetics, IV pain pain control, and IV fluid hydration - She is reporting ongoing melena and epigastric abd pain. Along with her reported recent history of coffee-ground emesis, there is concern for upper GI bleeding, most likely peptic ulcer disease - Advise EGD for endoluminal evaluation and potential hemostasis - Keep NPO - Plan for EGD this afternoon with Dr. Alice Reichert - See procedure note for findings and further recommendations   I reviewed the risks (including bleeding, perforation, infection, anesthesia complications, cardiac/respiratory complications), benefits and alternatives of EGD. Patient consents to proceed.  Thank you for the consult. Please call with questions or concerns.  [facility] [phone] (Cell)

## 2020-09-18 NOTE — ED Notes (Addendum)
Late entry -- per Dr  Francine Graven pt unable to have Flexi-seal placed due to colitis diagnoses - pt has melatonin now ordered prn for insomnia however pt is now resting with eyes closed; rr even and unlabored on 2L O2 via [place] (provider also made aware of suppl O2 required while pt at rest)

## 2020-09-18 NOTE — Op Note (Signed)
[facility] Gastroenterology Patient Name: Jaime Fitzgerald Procedure Date: 09/18/2020 3:09 PM MRN: [id number] Account #: [id number] Date of Birth: 06-Sep-1961 Admit Type: Inpatient Age: 59 Room: [facility] ENDO ROOM 2 Gender: Female Note Status: Finalized Procedure:             Upper GI endoscopy Indications:           Acute post hemorrhagic anemia, Hematemesis, Melena Providers:             Benay Pike. Alice Reichert MD, MD Referring MD:          [facility] Medicines:             Propofol per Anesthesia Complications:         No immediate complications. Procedure:             Pre-Anesthesia Assessment:                        - The risks and benefits of the procedure and the                         sedation options and risks were discussed with the                         patient. All questions were answered and informed                         consent was obtained.                        - Patient identification and proposed procedure were                         verified prior to the procedure by the nurse. The                         procedure was verified in the procedure room.                        - ASA Grade Assessment: II - A patient with mild                         systemic disease.                        - After reviewing the risks and benefits, the patient                         was deemed in satisfactory condition to undergo the                         procedure.                        After obtaining informed consent, the endoscope was                         passed under direct vision. Throughout the procedure,                         the patient's blood pressure,  pulse, and oxygen                         saturations were monitored continuously. The Endoscope                         was introduced through the mouth, and advanced to the                         third part of duodenum. The upper GI endoscopy was                         accomplished without  difficulty. The patient tolerated                         the procedure well. Findings:      The esophagus was normal.      A deformity was found in the stomach.      A 1 cm hiatal hernia was present.      The examined duodenum was normal.      The exam was otherwise without abnormality. Impression:            - Normal esophagus.                        - Acquired deformity.                        - 1 cm hiatal hernia.                        - Normal examined duodenum.                        - The examination was otherwise normal.                        - No specimens collected. Recommendation:        - Return patient to hospital ward for ongoing care.                        - Continue PPI given history of gastric ulcers.                        GI sign off. Call back if needed. Procedure Code(s):     --- Professional ---                        [phone], Esophagogastroduodenoscopy, flexible,                         transoral; diagnostic, including collection of                         specimen(s) by brushing or washing, when performed                         (separate procedure) Diagnosis Code(s):     --- Professional ---                        K92.1, Melena (includes Hematochezia)  K92.0, Hematemesis                        D62, Acute posthemorrhagic anemia                        K44.9, Diaphragmatic hernia without obstruction or                         gangrene                        K31.89, Other diseases of stomach and duodenum CPT copyright 2019 [employer]. All rights reserved. The codes documented in this report are preliminary and upon coder review may  be revised to meet current compliance requirements. Efrain Sella MD, MD 09/18/2020 3:47:13 PM This report has been signed electronically. Number of Addenda: 0 Note Initiated On: 09/18/2020 3:09 PM Estimated Blood Loss:  Estimated blood loss: none.      [facility]

## 2020-09-18 NOTE — Anesthesia Procedure Notes (Signed)
Procedure Name: Intubation Performed by: Letitia Neri, CRNA Pre-anesthesia Checklist: Patient identified, Patient being monitored, Timeout performed, Emergency Drugs available and Suction available Patient Re-evaluated:Patient Re-evaluated prior to induction Oxygen Delivery Method: Circle system utilized Preoxygenation: Pre-oxygenation with 100% oxygen Induction Type: IV induction Laryngoscope Size: Mac and 3 Grade View: Grade I Tube type: Oral Tube size: 7.0 mm Number of attempts: 1 Airway Equipment and Method: Stylet Placement Confirmation: ETT inserted through vocal cords under direct vision,  positive ETCO2 and breath sounds checked- equal and bilateral Secured at: 21 cm Tube secured with: Tape Dental Injury: Teeth and Oropharynx as per pre-operative assessment

## 2020-09-18 NOTE — Progress Notes (Signed)
PROGRESS NOTE    Jaime Fitzgerald   VHQ:[id number]  DOB: 08/28/1961  PCP: [facility] [employer]    DOA: 09/17/2020 LOS: 0   Brief Narrative   From H&P by Dr. Damita Dunnings: "Jaime Fitzgerald is a 59 y.o. female with medical history significant for DM, HTN, history of bleeding gastric ulcers on EGD in January 2021 requiring blood transfusions, who presents to the ER with a 3-week history of abdominal pain, vomiting and diarrhea with report of hematemesis x5 on the day prior to arrival, associated with black tarry stool .  Patient reports generalized sharp abdominal pain radiating to her back of severe intensity, unrelieved by Pepto-Bismol.  It is associated with vomiting, up to 3 times daily with inability to hold anything down.  Vomitus was initially gastric contents but was blood on the day prior to arrival.  She has been having several episodes of loose stool daily and states her stool is now watery and not helped by Pepto-Bismol.  States for the past week her stool has been dark and tarry like the last time she had bleeding back in January.  She denies fever or chills, denies cough or shortness of breath and denies dysuria. ED Course: On arrival, she was afebrile, BP 152/89, pulse 102, O2 sat 93% on room air.  Blood work significant for hemoglobin of 12.9, down from her baseline of 13.4.  Creatinine 1.15 up from baseline of 0.99.  Blood work otherwise unremarkable.  Rectal exam by ER provider yielded greenish-black stool on glove but stool guaiac negative.  GI panel revealed positive enteropathogenic E. coli and Campylobacter species EKG as reviewed by me : NSR at 95 with no acute ST-T wave changes Imaging: CT abdomen and pelvis with focal inflammation ascending colon likely inflammatory or infectious colitis   Patient was given IV Protonix.  Typed and crossed.  Hospitalist consulted for admission."     Assessment & Plan   Principal Problem:   Hematemesis Active Problems:    Hypertension   AKI (acute kidney injury) ([place])   Type 2 diabetes mellitus ([place])   History of gastric ulcer with hemorrhage   Enteropathogenic Escherichia coli infection   Campylobacter intestinal infection   Acute infective gastroenteritis   Acute colitis   Infectious colitis   Hematemesis and melena History of upper GI bleeding secondary to gastric ulcer with hemorrhage -in January 2019 Patient reported several days vomiting prior to hematemesis, could be Mallory-Weiss tear versus recurrent peptic ulcer disease.  Reported 5 episodes hematemesis with last on 12/13. --Monitor hemoglobin closely and transfuse if needed to keep hemoglobin greater than 7.0 --Continue IV Protonix twice daily --GI is consulted --IV hydration --Resume diet once cleared by GI  Acute colitis/gastroenteritis Enteropathogenic E. coli infection Campylobacter intestinal infection Above present on admission.  Patient having abdominal pain and diarrhea.  GI pathogen panel positive for above. --Continue supportive care with IV hydration, pain medications and antiemetics as needed --No indication for antibiotics, GI in agreement and following  Hypertension -labetalol as needed  Acute kidney injury -likely secondary to dehydration in the setting of gastroenteritis/colitis.  IV hydration.  Monitor BMP.  Type 2 diabetes -sliding scale NovoLog   Patient BMI: Body mass index is 33.66 kg/m.   DVT prophylaxis: SCDs Start: 09/17/20 2104   Diet:  Diet Orders (From admission, onward)    Start     Ordered   09/18/20 1550  Diet Carb Modified Fluid consistency: Thin; Room service appropriate? Yes  Diet effective now  Question Answer Comment  Diet-HS Snack? Nothing   Calorie Level Medium 1600-2000   Fluid consistency: Thin   Room service appropriate? Yes      09/18/20 1549            Code Status: Full Code    Subjective 09/18/20    Patient seen at bedside today.  She reports everything going right  through her whenever she eats or drinks.  Reports watery dark diarrhea.  Continues to have epigastric pain.  Says she has not been able to eat anything in several days.  No fevers or chills.   Disposition Plan & Communication   Status is: Inpatient  Remains inpatient appropriate because:Ongoing diagnostic testing needed not appropriate for outpatient work up, inadequate oral intake requiring maintenance IV hydration as diet is advanced   Dispo: The patient is from: Home              Anticipated d/c is to: Home              Anticipated d/c date is: 2 days              Patient currently is not medically stable to d/c.    Family Communication: none at bedside during encounter, will attempt to call    Consults, Procedures, Significant Events   Consultants:   GI  Procedures:   EGD planned  Antimicrobials:  Anti-infectives (From admission, onward)   None        Objective   Vitals:   09/18/20 1555 09/18/20 1604 09/18/20 1606 09/18/20 1618  BP:  (!) 100/55  139/74  Pulse: 90 91 88 92  Resp: (!) 21 (!) 21 19 15   Temp: (!) 97 F (36.1 C)   97.7 F (36.5 C)  TempSrc:      SpO2: 99% 98% 96% 95%  Weight:      Height:        Intake/Output Summary (Last 24 hours) at 09/18/2020 1737 Last data filed at 09/18/2020 1535 Gross per 24 hour  Intake --  Output 0 ml  Net 0 ml   Filed Weights   09/17/20 1312  Weight: 86.2 kg    Physical Exam:  General exam: awake, alert, no acute distress Respiratory system: CTAB, no wheezes, rales or rhonchi, normal respiratory effort. Cardiovascular system: normal S1/S2, RRR, no pedal edema.   Gastrointestinal system: soft, epigastric and right sided tenderness, ND, +bowel sounds. Central nervous system: A&O x3. no gross focal neurologic deficits, normal speech Extremities: moves all, no edema, normal tone Psychiatry: normal mood, congruent affect, judgement and insight appear normal  Labs   Data Reviewed: I have personally  reviewed following labs and imaging studies  CBC: Recent Labs  Lab 09/17/20 1314 09/18/20 0008  WBC 7.3 7.7  HGB 12.9 11.1*  HCT 38.8 34.0*  MCV 103.5* 104.6*  PLT 217 419   Basic Metabolic Panel: Recent Labs  Lab 09/17/20 1314 09/18/20 0643  NA 138 138  K 3.6 3.4*  CL 103 105  CO2 25 25  GLUCOSE 157* 101*  BUN 12 10  CREATININE 1.15* 1.02*  CALCIUM 8.5* 8.0*   GFR: Estimated Creatinine Clearance: 60.3 mL/min (A) (by C-G formula based on SCr of 1.02 mg/dL (H)). Liver Function Tests: Recent Labs  Lab 09/17/20 1314  AST 16  ALT 9  ALKPHOS 55  BILITOT 0.5  PROT 7.5  ALBUMIN 3.6   No results for input(s): LIPASE, AMYLASE in the last 168 hours. No results for input(s): AMMONIA in  the last 168 hours. Coagulation Profile: No results for input(s): INR, PROTIME in the last 168 hours. Cardiac Enzymes: No results for input(s): CKTOTAL, CKMB, CKMBINDEX, TROPONINI in the last 168 hours. BNP (last 3 results) No results for input(s): PROBNP in the last 8760 hours. HbA1C: Recent Labs    09/17/20 1314  HGBA1C 6.4*   CBG: Recent Labs  Lab 09/18/20 0013 09/18/20 0637 09/18/20 0711 09/18/20 1200 09/18/20 1554  GLUCAP 103* 96 82 113* 104*   Lipid Profile: No results for input(s): CHOL, HDL, LDLCALC, TRIG, CHOLHDL, LDLDIRECT in the last 72 hours. Thyroid Function Tests: No results for input(s): TSH, T4TOTAL, FREET4, T3FREE, THYROIDAB in the last 72 hours. Anemia Panel: No results for input(s): VITAMINB12, FOLATE, FERRITIN, TIBC, IRON, RETICCTPCT in the last 72 hours. Sepsis Labs: No results for input(s): PROCALCITON, LATICACIDVEN in the last 168 hours.  Recent Results (from the past 240 hour(s))  Gastrointestinal Panel by PCR , Stool     Status: Abnormal   Collection Time: 09/17/20  5:50 PM   Specimen: Stool  Result Value Ref Range Status   Campylobacter species DETECTED (A) NOT DETECTED Final    Comment: RESULT CALLED TO, READ BACK BY AND VERIFIED  WITH: NICHOLE GILBERT @2021  ON 09/17/20 SKL    Plesimonas shigelloides NOT DETECTED NOT DETECTED Final   Salmonella species NOT DETECTED NOT DETECTED Final   Yersinia enterocolitica NOT DETECTED NOT DETECTED Final   Vibrio species NOT DETECTED NOT DETECTED Final   Vibrio cholerae NOT DETECTED NOT DETECTED Final   Enteroaggregative E coli (EAEC) NOT DETECTED NOT DETECTED Final   Enteropathogenic E coli (EPEC) DETECTED (A) NOT DETECTED Final    Comment: RESULT CALLED TO, READ BACK BY AND VERIFIED WITH: NICHOLE GILBERT @2021  ON 09/17/20 SKL    Enterotoxigenic E coli (ETEC) NOT DETECTED NOT DETECTED Final   Shiga like toxin producing E coli (STEC) NOT DETECTED NOT DETECTED Final   Shigella/Enteroinvasive E coli (EIEC) NOT DETECTED NOT DETECTED Final   Cryptosporidium NOT DETECTED NOT DETECTED Final   Cyclospora cayetanensis NOT DETECTED NOT DETECTED Final   Entamoeba histolytica NOT DETECTED NOT DETECTED Final   Giardia lamblia NOT DETECTED NOT DETECTED Final   Adenovirus F40/41 NOT DETECTED NOT DETECTED Final   Astrovirus NOT DETECTED NOT DETECTED Final   Norovirus GI/GII NOT DETECTED NOT DETECTED Final   Rotavirus A NOT DETECTED NOT DETECTED Final   Sapovirus (I, II, IV, and V) NOT DETECTED NOT DETECTED Final    Comment: Performed at [facility], [address] [place], [place] [address]  C Difficile Quick Screen w PCR reflex     Status: None   Collection Time: 09/17/20  5:50 PM   Specimen: Stool  Result Value Ref Range Status   C Diff antigen NEGATIVE NEGATIVE Final   C Diff toxin NEGATIVE NEGATIVE Final   C Diff interpretation No C. difficile detected.  Final    Comment: Performed at [facility], [address] [place], [place] [address]  Resp Panel by RT-PCR (Flu A&B, Covid) Nasopharyngeal Swab     Status: None   Collection Time: 09/17/20  8:54 PM   Specimen: Nasopharyngeal Swab; Nasopharyngeal(NP) swabs in vial transport medium  Result Value Ref Range  Status   SARS Coronavirus 2 by RT PCR NEGATIVE NEGATIVE Final    Comment: (NOTE) SARS-CoV-2 target nucleic acids are NOT DETECTED.  The SARS-CoV-2 RNA is generally detectable in upper respiratory specimens during the acute phase of infection. The lowest concentration of SARS-CoV-2  viral copies this assay can detect is 138 copies/mL. A negative result does not preclude SARS-Cov-2 infection and should not be used as the sole basis for treatment or other patient management decisions. A negative result may occur with  improper specimen collection/handling, submission of specimen other than nasopharyngeal swab, presence of viral mutation(s) within the areas targeted by this assay, and inadequate number of viral copies(<138 copies/mL). A negative result must be combined with clinical observations, patient history, and epidemiological information. The expected result is Negative.  Fact Sheet for Patients:  EntrepreneurPulse.com.au  Fact Sheet for Healthcare Providers:  IncredibleEmployment.be  This test is no t yet approved or cleared by the [place] FDA and  has been authorized for detection and/or diagnosis of SARS-CoV-2 by FDA under an Emergency Use Authorization (EUA). This EUA will remain  in effect (meaning this test can be used) for the duration of the COVID-19 declaration under Section 564(b)(1) of the Act, 21 U.S.C.section 360bbb-3(b)(1), unless the authorization is terminated  or revoked sooner.       Influenza A by PCR NEGATIVE NEGATIVE Final   Influenza B by PCR NEGATIVE NEGATIVE Final    Comment: (NOTE) The Xpert Xpress SARS-CoV-2/FLU/RSV plus assay is intended as an aid in the diagnosis of influenza from Nasopharyngeal swab specimens and should not be used as a sole basis for treatment. Nasal washings and aspirates are unacceptable for Xpert Xpress SARS-CoV-2/FLU/RSV testing.  Fact Sheet for  Patients: EntrepreneurPulse.com.au  Fact Sheet for Healthcare Providers: IncredibleEmployment.be  This test is not yet approved or cleared by the [place] FDA and has been authorized for detection and/or diagnosis of SARS-CoV-2 by FDA under an Emergency Use Authorization (EUA). This EUA will remain in effect (meaning this test can be used) for the duration of the COVID-19 declaration under Section 564(b)(1) of the Act, 21 U.S.C. section 360bbb-3(b)(1), unless the authorization is terminated or revoked.  Performed at [facility], [address], [place], [place] [address]       Imaging Studies   CT ABDOMEN PELVIS W CONTRAST  Result Date: 09/17/2020 CLINICAL DATA:  Left-sided abdominal pain for 1 week. EXAM: CT ABDOMEN AND PELVIS WITH CONTRAST TECHNIQUE: Multidetector CT imaging of the abdomen and pelvis was performed using the standard protocol following bolus administration of intravenous contrast. CONTRAST:  118mL OMNIPAQUE IOHEXOL 300 MG/ML  SOLN COMPARISON:  CT scan 10/11/2019 FINDINGS: Lower chest: The lung bases are clear of an acute process. The heart is normal in size. Stable age advanced coronary artery and aortic calcifications. Hepatobiliary: No hepatic lesions or intrahepatic biliary dilatation. The gallbladder is grossly normal. No common bile duct dilatation. Pancreas: No mass, inflammation or ductal dilatation. Spleen: Normal size.  No focal lesions. Adrenals/Urinary Tract: The adrenal glands are. No renal lesions, calculi or hydronephrosis. The bladder is unremarkable. Stomach/Bowel: The stomach, duodenum and small bowel are unremarkable. No acute inflammatory changes, mass lesions or obstructive findings. The terminal ileum is unremarkable. The appendix is unremarkable. Descending colon and sigmoid colon diverticulosis without definite findings for acute diverticulitis. Focal inflammation involving the ascending colon above  the ileocecal valve likely inflammatory or infectious colitis. No mass or findings for obstruction. Vascular/Lymphatic: The aorta is normal in caliber. No dissection. Moderate age advanced atherosclerotic calcifications. The major venous structures are patent. No mesenteric or retroperitoneal mass or adenopathy. Reproductive: The uterus is surgically absent. Both ovaries are still present and appear normal. Other: No pelvic mass or adenopathy. No free pelvic fluid collections. No inguinal mass or adenopathy.  No abdominal wall hernia or subcutaneous lesions. Musculoskeletal: No significant bony findings. IMPRESSION: 1. Focal inflammation involving the ascending colon above the ileocecal valve likely inflammatory or infectious colitis. 2. Diffuse descending colon and sigmoid colon diverticulosis but no definite findings for acute diverticulitis. 3. No other significant abdominal/pelvic findings, mass lesions or adenopathy. 4. Age advanced atherosclerotic calcifications involving the aorta and coronary arteries. 5. Aortic atherosclerosis. Aortic Atherosclerosis (ICD10-I70.0). Electronically Signed   By: Marijo Sanes M.D.   On: 09/17/2020 19:02     Medications   Scheduled Meds: . Chlorhexidine Gluconate Cloth  6 each Topical Daily  . [START ON 09/19/2020] influenza vac split quadrivalent PF  0.5 mL Intramuscular Tomorrow-1000  . insulin aspart  0-15 Units Subcutaneous Q4H  . pantoprazole (PROTONIX) IV  40 mg Intravenous Q12H   Continuous Infusions: . sodium chloride 100 mL/hr at 09/18/20 1521       LOS: 0 days    Time spent: 30 minutes    Ezekiel Slocumb, DO [facility]  09/18/2020, 5:37 PM    If 7PM-7AM, please contact night-coverage. How to contact the [facility] Attending or Consulting provider [place] or covering provider during after hours [place], for this patient?    1. Check the care team in [facility] and look for a) attending/consulting [facility] provider listed and b) the [facility] team  listed 2. Log into [email or web address] and use [place]'s universal password to access. If you do not have the password, please contact the hospital operator. 3. Locate the [facility] provider you are looking for under [facility] and page to a number that you can be directly reached. 4. If you still have difficulty reaching the provider, please page the [facility] (Director on Call) for the Hospitalists listed on amion for assistance.

## 2020-09-18 NOTE — Interval H&P Note (Signed)
History and Physical Interval Note:  09/18/2020 2:46 PM  Jaime Fitzgerald  has presented today for surgery, with the diagnosis of Melena, hematmesis, epigastric abdominal pain, hx of bleeding gastric ulcers.  The various methods of treatment have been discussed with the patient and family. After consideration of risks, benefits and other options for treatment, the patient has consented to  Procedure(s): ESOPHAGOGASTRODUODENOSCOPY (EGD) (N/A) as a surgical intervention.  The patient's history has been reviewed, patient examined, no change in status, stable for surgery.  I have reviewed the patient's chart and labs.  Questions were answered to the patient's satisfaction.     [place], [place]

## 2020-09-19 ENCOUNTER — Encounter: Payer: Self-pay | Admitting: Internal Medicine

## 2020-09-19 ENCOUNTER — Inpatient Hospital Stay: Payer: Medicaid Other

## 2020-09-19 DIAGNOSIS — A040 Enteropathogenic Escherichia coli infection: Principal | ICD-10-CM

## 2020-09-19 LAB — GLUCOSE, CAPILLARY
Glucose-Capillary: 107 mg/dL — ABNORMAL HIGH (ref 70–99)
Glucose-Capillary: 114 mg/dL — ABNORMAL HIGH (ref 70–99)
Glucose-Capillary: 115 mg/dL — ABNORMAL HIGH (ref 70–99)
Glucose-Capillary: 120 mg/dL — ABNORMAL HIGH (ref 70–99)
Glucose-Capillary: 120 mg/dL — ABNORMAL HIGH (ref 70–99)

## 2020-09-19 LAB — CBC
HCT: 36.1 % (ref 36.0–46.0)
Hemoglobin: 11.6 g/dL — ABNORMAL LOW (ref 12.0–15.0)
MCH: 34.3 pg — ABNORMAL HIGH (ref 26.0–34.0)
MCHC: 32.1 g/dL (ref 30.0–36.0)
MCV: 106.8 fL — ABNORMAL HIGH (ref 80.0–100.0)
Platelets: 229 10*3/uL (ref 150–400)
RBC: 3.38 MIL/uL — ABNORMAL LOW (ref 3.87–5.11)
RDW: 13.6 % (ref 11.5–15.5)
WBC: 7.8 10*3/uL (ref 4.0–10.5)
nRBC: 0 % (ref 0.0–0.2)

## 2020-09-19 LAB — BASIC METABOLIC PANEL
Anion gap: 9 (ref 5–15)
BUN: 11 mg/dL (ref 8–23)
CO2: 24 mmol/L (ref 22–32)
Calcium: 8.2 mg/dL — ABNORMAL LOW (ref 8.9–10.3)
Chloride: 106 mmol/L (ref 98–111)
Creatinine, Ser: 1.03 mg/dL — ABNORMAL HIGH (ref 0.44–1.00)
GFR, Estimated: >60 mL/min (ref >60)
Glucose, Bld: 98 mg/dL (ref 70–99)
Potassium: 3.5 mmol/L (ref 3.5–5.1)
Sodium: 139 mmol/L (ref 135–145)

## 2020-09-19 LAB — MAGNESIUM: Magnesium: 1.6 mg/dL — ABNORMAL LOW (ref 1.7–2.4)

## 2020-09-19 MED ORDER — MAGNESIUM SULFATE 2 GM/50ML IV SOLN
2.0000 g | Freq: Once | INTRAVENOUS | Status: AC
  Administered 2020-09-19: 14:00:00 2 g via INTRAVENOUS
  Filled 2020-09-19: qty 50

## 2020-09-19 MED ORDER — LACTATED RINGERS IV SOLN: INTRAVENOUS | Status: DC

## 2020-09-19 MED ORDER — ALBUTEROL SULFATE HFA 108 (90 BASE) MCG/ACT IN AERS
2.0000 | INHALATION_SPRAY | Freq: Four times a day (QID) | RESPIRATORY_TRACT | Status: DC | PRN
  Filled 2020-09-19: qty 6.7

## 2020-09-19 MED ORDER — PANTOPRAZOLE SODIUM 40 MG PO TBEC: 40.0000 mg | DELAYED_RELEASE_TABLET | Freq: Two times a day (BID) | ORAL | Status: DC

## 2020-09-19 MED ORDER — PRAVASTATIN SODIUM 20 MG PO TABS
40.0000 mg | ORAL_TABLET | Freq: Every day | ORAL | Status: DC
  Administered 2020-09-19: 18:00:00 40 mg via ORAL
  Filled 2020-09-19: qty 2

## 2020-09-19 NOTE — Progress Notes (Addendum)
PROGRESS NOTE    Jaime Fitzgerald   ZLD:[id number]  DOB: 06/04/1961  PCP: [facility] [place]    DOA: 09/17/2020 LOS: 1   Brief Narrative   From H&P by Dr. Damita Dunnings: "Jaime Fitzgerald is a 59 y.o. female with medical history significant for DM, HTN, history of bleeding gastric ulcers on EGD in January 2021 requiring blood transfusions, who presents to the ER with a 3-week history of abdominal pain, vomiting and diarrhea with report of hematemesis x5 on the day prior to arrival, associated with black tarry stool .  Patient reports generalized sharp abdominal pain radiating to her back of severe intensity, unrelieved by Pepto-Bismol.  It is associated with vomiting, up to 3 times daily with inability to hold anything down.  Vomitus was initially gastric contents but was blood on the day prior to arrival.  She has been having several episodes of loose stool daily and states her stool is now watery and not helped by Pepto-Bismol.  States for the past week her stool has been dark and tarry like the last time she had bleeding back in January.  She denies fever or chills, denies cough or shortness of breath and denies dysuria. ED Course: On arrival, she was afebrile, BP 152/89, pulse 102, O2 sat 93% on room air.  Blood work significant for hemoglobin of 12.9, down from her baseline of 13.4.  Creatinine 1.15 up from baseline of 0.99.  Blood work otherwise unremarkable.  Rectal exam by ER provider yielded greenish-black stool on glove but stool guaiac negative.  GI panel revealed positive enteropathogenic E. coli and Campylobacter species EKG as reviewed by me : NSR at 95 with no acute ST-T wave changes Imaging: CT abdomen and pelvis with focal inflammation ascending colon likely inflammatory or infectious colitis   Patient was given IV Protonix.  Typed and crossed.  Hospitalist consulted for admission."     Assessment & Plan   Principal Problem:   Hematemesis Active Problems:    Hypertension   AKI (acute kidney injury) ([place])   Type 2 diabetes mellitus ([place])   History of gastric ulcer with hemorrhage   Enteropathogenic Escherichia coli infection   Campylobacter intestinal infection   Acute infective gastroenteritis   Acute colitis   Infectious colitis   Hematemesis and melena History of upper GI bleeding secondary to gastric ulcer with hemorrhage -in January 2019 Patient reported several days vomiting prior to hematemesis, could be Mallory-Weiss tear versus recurrent peptic ulcer disease.  Reported 5 episodes hematemesis with last on 12/13. EGD on 12/15 - no bleeding source identified, small hiatal hernia. 12/16 - hemoglobin stable above 11.  No further episodes of hematemesis. --Monitor hemoglobin closely and transfuse if needed to keep hemoglobin greater than 7.0 --Continue IV Protonix twice daily --GI consulted, signed off after EGD --continue IV hydration until oral intake improves --Diet as tolerated  Acute colitis/gastroenteritis Enteropathogenic E. coli infection Campylobacter intestinal infection Above present on admission.  Patient having abdominal pain and diarrhea.   GI pathogen panel positive for above. 12/16 - still unable to tolerate diet, ongoing pain, now with significant distention --Continue supportive care with IV hydration, pain medications and antiemetics as needed --No indication for antibiotics, GI in agreement and following --Abdominal xray 12/16 - negative for free air --Abdominal ultrasound pending  Hypertension -labetalol as needed  Acute kidney injury RULED OUT - mild elevation of creatinine, but does meet criteria for AKI. Likely secondary to dehydration in the setting of gastroenteritis/colitis.  IV  hydration.  Monitor BMP.  Type 2 diabetes -sliding scale NovoLog   Patient BMI: Body mass index is 33.66 kg/m.   DVT prophylaxis: SCDs Start: 09/17/20 2104   Diet:  Diet Orders (From admission, onward)     Start      Ordered   09/18/20 1550  Diet Carb Modified Fluid consistency: Thin; Room service appropriate? Yes  Diet effective now       Question Answer Comment  Diet-HS Snack? Nothing   Calorie Level Medium 1600-2000   Fluid consistency: Thin   Room service appropriate? Yes      09/18/20 1549              Code Status: Full Code    Subjective 09/19/20    Patient seen at bedside today.  She reports still unable to tolerate any food, runs right through her and has diarrhea.  Tolerates little bit of water.  Abdomen is tight feeling and distended.  No fever or chills.  States is passing gas and stool.     Disposition Plan & Communication   Status is: Inpatient  Remains inpatient appropriate because:Ongoing diagnostic testing needed not appropriate for outpatient work up, inadequate oral intake requiring maintenance IV hydration.   Dispo: The patient is from: Home              Anticipated d/c is to: Home              Anticipated d/c date is: 2 days              Patient currently is not medically stable to d/c.    Family Communication: none at bedside during encounter, will attempt to call    Consults, Procedures, Significant Events   Consultants:  GI  Procedures:  EGD planned  Antimicrobials:  Anti-infectives (From admission, onward)    None         Objective   Vitals:   09/19/20 0000 09/19/20 0357 09/19/20 0806 09/19/20 1235  BP: 138/78 139/67 (!) 152/94 127/67  Pulse: 98 91 97 84  Resp: 18 20 20 16   Temp: 98.6 F (37 C) 98 F (36.7 C) 98.3 F (36.8 C) 98.5 F (36.9 C)  TempSrc: Oral Oral Oral   SpO2: 96% 90% 91% 91%  Weight:      Height:        Intake/Output Summary (Last 24 hours) at 09/19/2020 1427 Last data filed at 09/19/2020 0535 Gross per 24 hour  Intake 120 ml  Output 0 ml  Net 120 ml   Filed Weights   09/17/20 1312  Weight: 86.2 kg    Physical Exam:  General exam: awake, alert, no acute distress Respiratory system: CTAB, no wheezes,  rales or rhonchi, normal respiratory effort. Cardiovascular system: normal S1/S2, RRR, no pedal edema.   Gastrointestinal system: distended, mild epigastric tenderness without rebound or guarding, hypoactive bowel sounds. Extremities: moves all, no edema, normal tone    Labs   Data Reviewed: I have personally reviewed following labs and imaging studies  CBC: Recent Labs  Lab 09/17/20 1314 09/18/20 0008 09/19/20 0647  WBC 7.3 7.7 7.8  HGB 12.9 11.1* 11.6*  HCT 38.8 34.0* 36.1  MCV 103.5* 104.6* 106.8*  PLT 217 200 970   Basic Metabolic Panel: Recent Labs  Lab 09/17/20 1314 09/18/20 0643 09/19/20 0647  NA 138 138 139  K 3.6 3.4* 3.5  CL 103 105 106  CO2 25 25 24   GLUCOSE 157* 101* 98  BUN 12 10 11  CREATININE 1.15* 1.02* 1.03*  CALCIUM 8.5* 8.0* 8.2*  MG  --   --  1.6*   GFR: Estimated Creatinine Clearance: 59.7 mL/min (A) (by C-G formula based on SCr of 1.03 mg/dL (H)). Liver Function Tests: Recent Labs  Lab 09/17/20 1314  AST 16  ALT 9  ALKPHOS 55  BILITOT 0.5  PROT 7.5  ALBUMIN 3.6   No results for input(s): LIPASE, AMYLASE in the last 168 hours. No results for input(s): AMMONIA in the last 168 hours. Coagulation Profile: No results for input(s): INR, PROTIME in the last 168 hours. Cardiac Enzymes: No results for input(s): CKTOTAL, CKMB, CKMBINDEX, TROPONINI in the last 168 hours. BNP (last 3 results) No results for input(s): PROBNP in the last 8760 hours. HbA1C: Recent Labs    09/17/20 1314  HGBA1C 6.4*   CBG: Recent Labs  Lab 09/18/20 1554 09/18/20 2140 09/19/20 0400 09/19/20 0752 09/19/20 1233  GLUCAP 104* 119* 107* 114* 115*   Lipid Profile: No results for input(s): CHOL, HDL, LDLCALC, TRIG, CHOLHDL, LDLDIRECT in the last 72 hours. Thyroid Function Tests: No results for input(s): TSH, T4TOTAL, FREET4, T3FREE, THYROIDAB in the last 72 hours. Anemia Panel: No results for input(s): VITAMINB12, FOLATE, FERRITIN, TIBC, IRON, RETICCTPCT  in the last 72 hours. Sepsis Labs: No results for input(s): PROCALCITON, LATICACIDVEN in the last 168 hours.  Recent Results (from the past 240 hour(s))  Gastrointestinal Panel by PCR , Stool     Status: Abnormal   Collection Time: 09/17/20  5:50 PM   Specimen: Stool  Result Value Ref Range Status   Campylobacter species DETECTED (A) NOT DETECTED Final    Comment: RESULT CALLED TO, READ BACK BY AND VERIFIED WITH: NICHOLE GILBERT @2021  ON 09/17/20 SKL    Plesimonas shigelloides NOT DETECTED NOT DETECTED Final   Salmonella species NOT DETECTED NOT DETECTED Final   Yersinia enterocolitica NOT DETECTED NOT DETECTED Final   Vibrio species NOT DETECTED NOT DETECTED Final   Vibrio cholerae NOT DETECTED NOT DETECTED Final   Enteroaggregative E coli (EAEC) NOT DETECTED NOT DETECTED Final   Enteropathogenic E coli (EPEC) DETECTED (A) NOT DETECTED Final    Comment: RESULT CALLED TO, READ BACK BY AND VERIFIED WITH: NICHOLE GILBERT @2021  ON 09/17/20 SKL    Enterotoxigenic E coli (ETEC) NOT DETECTED NOT DETECTED Final   Shiga like toxin producing E coli (STEC) NOT DETECTED NOT DETECTED Final   Shigella/Enteroinvasive E coli (EIEC) NOT DETECTED NOT DETECTED Final   Cryptosporidium NOT DETECTED NOT DETECTED Final   Cyclospora cayetanensis NOT DETECTED NOT DETECTED Final   Entamoeba histolytica NOT DETECTED NOT DETECTED Final   Giardia lamblia NOT DETECTED NOT DETECTED Final   Adenovirus F40/41 NOT DETECTED NOT DETECTED Final   Astrovirus NOT DETECTED NOT DETECTED Final   Norovirus GI/GII NOT DETECTED NOT DETECTED Final   Rotavirus A NOT DETECTED NOT DETECTED Final   Sapovirus (I, II, IV, and V) NOT DETECTED NOT DETECTED Final    Comment: Performed at [facility], [address] [place], [place] [address]  C Difficile Quick Screen w PCR reflex     Status: None   Collection Time: 09/17/20  5:50 PM   Specimen: Stool  Result Value Ref Range Status   C Diff antigen NEGATIVE NEGATIVE  Final   C Diff toxin NEGATIVE NEGATIVE Final   C Diff interpretation No C. difficile detected.  Final    Comment: Performed at [facility], [address] [place], [place] [address]  Resp Panel  by RT-PCR (Flu A&B, Covid) Nasopharyngeal Swab     Status: None   Collection Time: 09/17/20  8:54 PM   Specimen: Nasopharyngeal Swab; Nasopharyngeal(NP) swabs in vial transport medium  Result Value Ref Range Status   SARS Coronavirus 2 by RT PCR NEGATIVE NEGATIVE Final    Comment: (NOTE) SARS-CoV-2 target nucleic acids are NOT DETECTED.  The SARS-CoV-2 RNA is generally detectable in upper respiratory specimens during the acute phase of infection. The lowest concentration of SARS-CoV-2 viral copies this assay can detect is 138 copies/mL. A negative result does not preclude SARS-Cov-2 infection and should not be used as the sole basis for treatment or other patient management decisions. A negative result may occur with  improper specimen collection/handling, submission of specimen other than nasopharyngeal swab, presence of viral mutation(s) within the areas targeted by this assay, and inadequate number of viral copies(<138 copies/mL). A negative result must be combined with clinical observations, patient history, and epidemiological information. The expected result is Negative.  Fact Sheet for Patients:  EntrepreneurPulse.com.au  Fact Sheet for Healthcare Providers:  IncredibleEmployment.be  This test is no t yet approved or cleared by the [place] FDA and  has been authorized for detection and/or diagnosis of SARS-CoV-2 by FDA under an Emergency Use Authorization (EUA). This EUA will remain  in effect (meaning this test can be used) for the duration of the COVID-19 declaration under Section 564(b)(1) of the Act, 21 U.S.C.section 360bbb-3(b)(1), unless the authorization is terminated  or revoked sooner.       Influenza A by PCR  NEGATIVE NEGATIVE Final   Influenza B by PCR NEGATIVE NEGATIVE Final    Comment: (NOTE) The Xpert Xpress SARS-CoV-2/FLU/RSV plus assay is intended as an aid in the diagnosis of influenza from Nasopharyngeal swab specimens and should not be used as a sole basis for treatment. Nasal washings and aspirates are unacceptable for Xpert Xpress SARS-CoV-2/FLU/RSV testing.  Fact Sheet for Patients: EntrepreneurPulse.com.au  Fact Sheet for Healthcare Providers: IncredibleEmployment.be  This test is not yet approved or cleared by the [place] FDA and has been authorized for detection and/or diagnosis of SARS-CoV-2 by FDA under an Emergency Use Authorization (EUA). This EUA will remain in effect (meaning this test can be used) for the duration of the COVID-19 declaration under Section 564(b)(1) of the Act, 21 U.S.C. section 360bbb-3(b)(1), unless the authorization is terminated or revoked.  Performed at [facility], [address] [place],  [address]       Imaging Studies   CT ABDOMEN PELVIS W CONTRAST  Result Date: 09/17/2020 CLINICAL DATA:  Left-sided abdominal pain for 1 week. EXAM: CT ABDOMEN AND PELVIS WITH CONTRAST TECHNIQUE: Multidetector CT imaging of the abdomen and pelvis was performed using the standard protocol following bolus administration of intravenous contrast. CONTRAST:  153mL OMNIPAQUE IOHEXOL 300 MG/ML  SOLN COMPARISON:  CT scan 10/11/2019 FINDINGS: Lower chest: The lung bases are clear of an acute process. The heart is normal in size. Stable age advanced coronary artery and aortic calcifications. Hepatobiliary: No hepatic lesions or intrahepatic biliary dilatation. The gallbladder is grossly normal. No common bile duct dilatation. Pancreas: No mass, inflammation or ductal dilatation. Spleen: Normal size.  No focal lesions. Adrenals/Urinary Tract: The adrenal glands are. No renal lesions, calculi or hydronephrosis.  The bladder is unremarkable. Stomach/Bowel: The stomach, duodenum and small bowel are unremarkable. No acute inflammatory changes, mass lesions or obstructive findings. The terminal ileum is unremarkable. The appendix is unremarkable. Descending colon and sigmoid colon diverticulosis without  definite findings for acute diverticulitis. Focal inflammation involving the ascending colon above the ileocecal valve likely inflammatory or infectious colitis. No mass or findings for obstruction. Vascular/Lymphatic: The aorta is normal in caliber. No dissection. Moderate age advanced atherosclerotic calcifications. The major venous structures are patent. No mesenteric or retroperitoneal mass or adenopathy. Reproductive: The uterus is surgically absent. Both ovaries are still present and appear normal. Other: No pelvic mass or adenopathy. No free pelvic fluid collections. No inguinal mass or adenopathy. No abdominal wall hernia or subcutaneous lesions. Musculoskeletal: No significant bony findings. IMPRESSION: 1. Focal inflammation involving the ascending colon above the ileocecal valve likely inflammatory or infectious colitis. 2. Diffuse descending colon and sigmoid colon diverticulosis but no definite findings for acute diverticulitis. 3. No other significant abdominal/pelvic findings, mass lesions or adenopathy. 4. Age advanced atherosclerotic calcifications involving the aorta and coronary arteries. 5. Aortic atherosclerosis. Aortic Atherosclerosis (ICD10-I70.0). Electronically Signed   By: Marijo Sanes M.D.   On: 09/17/2020 19:02   DG Abd Portable 1V  Result Date: 09/19/2020 CLINICAL DATA:  Rule out free air. EXAM: PORTABLE ABDOMEN - 1 VIEW COMPARISON:  CT abdomen pelvis 09/07/2020 FINDINGS: Upright image including the diaphragms reveals no free air. Limited evaluation remainder of the abdomen which is under penetrated. Cardiac enlargement. IMPRESSION: Negative for free air. Electronically Signed   By: Franchot Gallo M.D.   On: 09/19/2020 14:19     Medications   Scheduled Meds:  Chlorhexidine Gluconate Cloth  6 each Topical Daily   insulin aspart  0-15 Units Subcutaneous Q4H   pantoprazole (PROTONIX) IV  40 mg Intravenous Q12H   pravastatin  40 mg Oral Daily   Continuous Infusions:  lactated ringers 75 mL/hr at 09/19/20 1419   magnesium sulfate bolus IVPB 2 g (09/19/20 1423)       LOS: 1 day    Time spent: 30 minutes with > 50% spent at bedside and in coordination of care    Ezekiel Slocumb, DO [facility]  09/19/2020, 2:27 PM    If 7PM-7AM, please contact night-coverage. How to contact the [facility] Attending or Consulting provider [place] or covering provider during after hours [place], for this patient?    Check the care team in [facility] and look for a) attending/consulting [facility] provider listed and b) the [facility] team listed Log into [email or web address] and use [place]'s universal password to access. If you do not have the password, please contact the hospital operator. Locate the [facility] provider you are looking for under [facility] and page to a number that you can be directly reached. If you still have difficulty reaching the provider, please page the [facility] (Director on Call) for the Hospitalists listed on amion for assistance.

## 2020-09-20 LAB — COMPREHENSIVE METABOLIC PANEL
ALT: 10 U/L (ref 0–44)
AST: 16 U/L (ref 15–41)
Albumin: 2.9 g/dL — ABNORMAL LOW (ref 3.5–5.0)
Alkaline Phosphatase: 51 U/L (ref 38–126)
Anion gap: 10 (ref 5–15)
BUN: 11 mg/dL (ref 8–23)
CO2: 24 mmol/L (ref 22–32)
Calcium: 8.4 mg/dL — ABNORMAL LOW (ref 8.9–10.3)
Chloride: 103 mmol/L (ref 98–111)
Creatinine, Ser: 0.83 mg/dL (ref 0.44–1.00)
GFR, Estimated: >60 mL/min (ref >60)
Glucose, Bld: 122 mg/dL — ABNORMAL HIGH (ref 70–99)
Potassium: 3.6 mmol/L (ref 3.5–5.1)
Sodium: 137 mmol/L (ref 135–145)
Total Bilirubin: 0.3 mg/dL (ref 0.3–1.2)
Total Protein: 6.3 g/dL — ABNORMAL LOW (ref 6.5–8.1)

## 2020-09-20 LAB — HEMOGLOBIN AND HEMATOCRIT, BLOOD
HCT: 33.9 % — ABNORMAL LOW (ref 36.0–46.0)
Hemoglobin: 10.9 g/dL — ABNORMAL LOW (ref 12.0–15.0)

## 2020-09-20 LAB — MAGNESIUM: Magnesium: 1.7 mg/dL (ref 1.7–2.4)

## 2020-09-20 LAB — GLUCOSE, CAPILLARY
Glucose-Capillary: 123 mg/dL — ABNORMAL HIGH (ref 70–99)
Glucose-Capillary: 121 mg/dL — ABNORMAL HIGH (ref 70–99)
Glucose-Capillary: 95 mg/dL (ref 70–99)

## 2020-09-20 MED ORDER — MECLIZINE HCL 12.5 MG PO TABS: 12.5000 mg | ORAL_TABLET | Freq: Three times a day (TID) | ORAL | 0 refills | Status: DC | PRN

## 2020-09-20 MED ORDER — ONDANSETRON HCL 4 MG PO TABS: 4.0000 mg | ORAL_TABLET | Freq: Four times a day (QID) | ORAL | 0 refills | Status: DC | PRN

## 2020-09-20 NOTE — Discharge Summary (Signed)
Physician Discharge Summary  Jaime Fitzgerald:[id number] DOB: 1961-05-27 DOA: 09/17/2020  PCP: [facility] [place] date: 09/17/2020 Discharge date: 09/20/2020  Admitted From: home Disposition:  home  Recommendations for Outpatient Follow-up:  1. Follow up with PCP in 1-2 weeks 2. Please obtain BMP/CBC in one week   Home Health: No  Equipment/Devices: None  Discharge Condition: Stable CODE STATUS: Full  Diet recommendation: Carb Modified    Discharge Diagnoses: Principal Problem:   Hematemesis Active Problems:   Hypertension   AKI (acute kidney injury) ([place])   Type 2 diabetes mellitus ([place])   History of gastric ulcer with hemorrhage   Enteropathogenic Escherichia coli infection   Campylobacter intestinal infection   Acute infective gastroenteritis   Acute colitis   Infectious colitis    Summary of HPI and Hospital Course:  From H&P by Dr. Damita Dunnings: "Jaime Fitzgerald is a 59 y.o. female with medical history significant for DM, HTN, history of bleeding gastric ulcers on EGD in January 2021 requiring blood transfusions, who presents to the ER with a 3-week history of abdominal pain, vomiting and diarrhea with report of hematemesis x5 on the day prior to arrival, associated with black tarry stool .  Patient reports generalized sharp abdominal pain radiating to her back of severe intensity, unrelieved by Pepto-Bismol.  It is associated with vomiting, up to 3 times daily with inability to hold anything down.  Vomitus was initially gastric contents but was blood on the day prior to arrival.  She has been having several episodes of loose stool daily and states her stool is now watery and not helped by Pepto-Bismol.  States for the past week her stool has been dark and tarry like the last time she had bleeding back in January.  She denies fever or chills, denies cough or shortness of breath and denies dysuria. ED Course: On arrival, she was afebrile, BP 152/89,  pulse 102, O2 sat 93% on room air.  Blood work significant for hemoglobin of 12.9, down from her baseline of 13.4.  Creatinine 1.15 up from baseline of 0.99.  Blood work otherwise unremarkable.  Rectal exam by ER provider yielded greenish-black stool on glove but stool guaiac negative.  GI panel revealed positive enteropathogenic E. coli and Campylobacter species EKG as reviewed by me : NSR at 95 with no acute ST-T wave changes Imaging: CT abdomen and pelvis with focal inflammation ascending colon likely inflammatory or infectious colitis   Patient was given IV Protonix.  Typed and crossed.  Hospitalist consulted for admission."      Hematemesis and melena History of upper GI bleeding secondary to gastric ulcer with hemorrhage -in January 2019 Patient reported several days vomiting prior to hematemesis, could be Mallory-Weiss tear versus recurrent peptic ulcer disease.  Reported 5 episodes hematemesis with last on 12/13. EGD on 12/15 - no bleeding source identified, small hiatal hernia. 12/16 - hemoglobin stable above 11.  No further episodes of hematemesis. Treated with IV Protonix twice daily GI was consulted and performed EGD which did not reveal any bleeding source. Treated with IV hydration until tolerating diet.  Patient had adequate oral intake by time of d/c.  Acute colitis/gastroenteritis Enteropathogenic E. coli infection Campylobacter intestinal infection Above present on admission.  Patient having abdominal pain and diarrhea.   GI pathogen panel positive for above. Managed with supportive care with IV hydration, pain medications and antiemetics as needed No indication for antibiotics, GI in agreement Abdominal xray 12/16 - negative for free  air Abdominal ultrasound showed no ascites   Acute kidney injury RULED OUT - mild elevation of creatinine, but does meet criteria for AKI. Likely secondary to dehydration in the setting of gastroenteritis/colitis.  IV hydration.   Monitor BMP.   Hypertension - managed with labetalol as needed  Type 2 diabetes -sliding scale NovoLog   Obesity: Body mass index is 33.66 kg/m.  Complicates overall care and prognosis.  Recommend lifestyle modifications for weight loss.     Discharge Instructions   Discharge Instructions    Call MD for:  extreme fatigue   Complete by: As directed    Call MD for:  persistant dizziness or light-headedness   Complete by: As directed    Call MD for:  persistant nausea and vomiting   Complete by: As directed    Call MD for:  severe uncontrolled pain   Complete by: As directed    Call MD for:  temperature >100.4   Complete by: As directed    Diet - low sodium heart healthy   Complete by: As directed    Discharge instructions   Complete by: As directed    Please be sure to drink plenty of fluids to avoid dehydration.   I sent prescription for Zofran (ondansetron) for nausea/vomiting, take as needed. Eat whatever foods you can tolerate without nausea, vomiting or increased belly pain.   Start with softer, bland foods and slowly start advancing your diet if not having any issues.  Please see primary care in 1-2 weeks.   Increase activity slowly   Complete by: As directed      Allergies as of 09/20/2020      Reactions   Aspirin Nausea Only, Other (See Comments)   GI upset   Penicillins Rash      Medication List    STOP taking these medications   amLODipine 10 MG tablet Commonly known as: NORVASC   cloNIDine 0.1 MG tablet Commonly known as: CATAPRES   gabapentin 300 MG capsule Commonly known as: NEURONTIN   glipiZIDE 10 MG 24 hr tablet Commonly known as: GLUCOTROL XL   polyethylene glycol powder 17 GM/SCOOP powder Commonly known as: GLYCOLAX/MIRALAX   topiramate 100 MG tablet Commonly known as: TOPAMAX     TAKE these medications   albuterol 108 (90 Base) MCG/ACT inhaler Commonly known as: VENTOLIN HFA Inhale 2 puffs into the lungs every 6 (six) hours  as needed for wheezing or shortness of breath.   meclizine 12.5 MG tablet Commonly known as: ANTIVERT Take 1 tablet (12.5 mg total) by mouth 3 (three) times daily as needed for up to 30 doses for dizziness.   ondansetron 4 MG tablet Commonly known as: ZOFRAN Take 1 tablet (4 mg total) by mouth every 6 (six) hours as needed for nausea.   pantoprazole 40 MG tablet Commonly known as: PROTONIX TAKE ONE TABLET BY MOUTH TWO TIMES A DAY BEFORE A MEAL   pravastatin 40 MG tablet Commonly known as: PRAVACHOL Take 40 mg by mouth daily.   vitamin B-12 1000 MCG tablet Commonly known as: CYANOCOBALAMIN TAKE ONE TABLET BY MOUTH EVERY DAY       Allergies  Allergen Reactions  . Aspirin Nausea Only and Other (See Comments)    GI upset  . Penicillins Rash    Consultations:  GI    Procedures/Studies: CT ABDOMEN PELVIS W CONTRAST  Result Date: 09/17/2020 CLINICAL DATA:  Left-sided abdominal pain for 1 week. EXAM: CT ABDOMEN AND PELVIS WITH CONTRAST TECHNIQUE: Multidetector CT imaging  of the abdomen and pelvis was performed using the standard protocol following bolus administration of intravenous contrast. CONTRAST:  126mL OMNIPAQUE IOHEXOL 300 MG/ML  SOLN COMPARISON:  CT scan 10/11/2019 FINDINGS: Lower chest: The lung bases are clear of an acute process. The heart is normal in size. Stable age advanced coronary artery and aortic calcifications. Hepatobiliary: No hepatic lesions or intrahepatic biliary dilatation. The gallbladder is grossly normal. No common bile duct dilatation. Pancreas: No mass, inflammation or ductal dilatation. Spleen: Normal size.  No focal lesions. Adrenals/Urinary Tract: The adrenal glands are. No renal lesions, calculi or hydronephrosis. The bladder is unremarkable. Stomach/Bowel: The stomach, duodenum and small bowel are unremarkable. No acute inflammatory changes, mass lesions or obstructive findings. The terminal ileum is unremarkable. The appendix is unremarkable.  Descending colon and sigmoid colon diverticulosis without definite findings for acute diverticulitis. Focal inflammation involving the ascending colon above the ileocecal valve likely inflammatory or infectious colitis. No mass or findings for obstruction. Vascular/Lymphatic: The aorta is normal in caliber. No dissection. Moderate age advanced atherosclerotic calcifications. The major venous structures are patent. No mesenteric or retroperitoneal mass or adenopathy. Reproductive: The uterus is surgically absent. Both ovaries are still present and appear normal. Other: No pelvic mass or adenopathy. No free pelvic fluid collections. No inguinal mass or adenopathy. No abdominal wall hernia or subcutaneous lesions. Musculoskeletal: No significant bony findings. IMPRESSION: 1. Focal inflammation involving the ascending colon above the ileocecal valve likely inflammatory or infectious colitis. 2. Diffuse descending colon and sigmoid colon diverticulosis but no definite findings for acute diverticulitis. 3. No other significant abdominal/pelvic findings, mass lesions or adenopathy. 4. Age advanced atherosclerotic calcifications involving the aorta and coronary arteries. 5. Aortic atherosclerosis. Aortic Atherosclerosis (ICD10-I70.0). Electronically Signed   By: Marijo Sanes M.D.   On: 09/17/2020 19:02   DG Abd Portable 1V  Result Date: 09/19/2020 CLINICAL DATA:  Rule out free air. EXAM: PORTABLE ABDOMEN - 1 VIEW COMPARISON:  CT abdomen pelvis 09/07/2020 FINDINGS: Upright image including the diaphragms reveals no free air. Limited evaluation remainder of the abdomen which is under penetrated. Cardiac enlargement. IMPRESSION: Negative for free air. Electronically Signed   By: Franchot Gallo M.D.   On: 09/19/2020 14:19   [place] ASCITES (ABDOMEN LIMITED)  Result Date: 09/19/2020 CLINICAL DATA:  Abdominal distension. EXAM: ULTRASOUND ABDOMEN LIMITED COMPARISON:  X-ray abdomen 09/19/2020, CT abdomen pelvis 09/17/2020.  FINDINGS: No free fluid noted within the abdomen. IMPRESSION: No free fluid noted within the abdomen. Please note specific organs were not evaluated. Electronically Signed   By: Iven Finn M.D.   On: 09/19/2020 18:15       Subjective: Pt feeling better today.  Tolerating diet.  No acute complaints.   Discharge Exam: Vitals:   09/20/20 0412 09/20/20 0744  BP: 137/81 125/60  Pulse: 82 85  Resp: 20 20  Temp: 98.6 F (37 C) 98.5 F (36.9 C)  SpO2: 90% 94%   Vitals:   09/19/20 2225 09/19/20 2324 09/20/20 0412 09/20/20 0744  BP: (!) 148/58 (!) 166/59 137/81 125/60  Pulse: 91 100 82 85  Resp: 20 20 20 20   Temp: 98.7 F (37.1 C) 99 F (37.2 C) 98.6 F (37 C) 98.5 F (36.9 C)  TempSrc:  Oral Oral Oral  SpO2: 91% 90% 90% 94%  Weight:      Height:        General: Pt is alert, awake, not in acute distress Cardiovascular: RRR, S1/S2 +, no rubs, no gallops Respiratory: CTA bilaterally, no wheezing, no  rhonchi Abdominal: Soft, NT, ND, bowel sounds + Extremities: no edema, no cyanosis    The results of significant diagnostics from this hospitalization (including imaging, microbiology, ancillary and laboratory) are listed below for reference.     Microbiology: Recent Results (from the past 240 hour(s))  Gastrointestinal Panel by PCR , Stool     Status: Abnormal   Collection Time: 09/17/20  5:50 PM   Specimen: Stool  Result Value Ref Range Status   Campylobacter species DETECTED (A) NOT DETECTED Final    Comment: RESULT CALLED TO, READ BACK BY AND VERIFIED WITH: NICHOLE GILBERT @2021  ON 09/17/20 SKL    Plesimonas shigelloides NOT DETECTED NOT DETECTED Final   Salmonella species NOT DETECTED NOT DETECTED Final   Yersinia enterocolitica NOT DETECTED NOT DETECTED Final   Vibrio species NOT DETECTED NOT DETECTED Final   Vibrio cholerae NOT DETECTED NOT DETECTED Final   Enteroaggregative E coli (EAEC) NOT DETECTED NOT DETECTED Final   Enteropathogenic E coli (EPEC)  DETECTED (A) NOT DETECTED Final    Comment: RESULT CALLED TO, READ BACK BY AND VERIFIED WITH: NICHOLE GILBERT @2021  ON 09/17/20 SKL    Enterotoxigenic E coli (ETEC) NOT DETECTED NOT DETECTED Final   Shiga like toxin producing E coli (STEC) NOT DETECTED NOT DETECTED Final   Shigella/Enteroinvasive E coli (EIEC) NOT DETECTED NOT DETECTED Final   Cryptosporidium NOT DETECTED NOT DETECTED Final   Cyclospora cayetanensis NOT DETECTED NOT DETECTED Final   Entamoeba histolytica NOT DETECTED NOT DETECTED Final   Giardia lamblia NOT DETECTED NOT DETECTED Final   Adenovirus F40/41 NOT DETECTED NOT DETECTED Final   Astrovirus NOT DETECTED NOT DETECTED Final   Norovirus GI/GII NOT DETECTED NOT DETECTED Final   Rotavirus A NOT DETECTED NOT DETECTED Final   Sapovirus (I, II, IV, and V) NOT DETECTED NOT DETECTED Final    Comment: Performed at [facility], [address] [place], [place] [address]  C Difficile Quick Screen w PCR reflex     Status: None   Collection Time: 09/17/20  5:50 PM   Specimen: Stool  Result Value Ref Range Status   C Diff antigen NEGATIVE NEGATIVE Final   C Diff toxin NEGATIVE NEGATIVE Final   C Diff interpretation No C. difficile detected.  Final    Comment: Performed at [facility], [address] [place], [place] [address]  Resp Panel by RT-PCR (Flu A&B, Covid) Nasopharyngeal Swab     Status: None   Collection Time: 09/17/20  8:54 PM   Specimen: Nasopharyngeal Swab; Nasopharyngeal(NP) swabs in vial transport medium  Result Value Ref Range Status   SARS Coronavirus 2 by RT PCR NEGATIVE NEGATIVE Final    Comment: (NOTE) SARS-CoV-2 target nucleic acids are NOT DETECTED.  The SARS-CoV-2 RNA is generally detectable in upper respiratory specimens during the acute phase of infection. The lowest concentration of SARS-CoV-2 viral copies this assay can detect is 138 copies/mL. A negative result does not preclude SARS-Cov-2 infection and should not be used  as the sole basis for treatment or other patient management decisions. A negative result may occur with  improper specimen collection/handling, submission of specimen other than nasopharyngeal swab, presence of viral mutation(s) within the areas targeted by this assay, and inadequate number of viral copies(<138 copies/mL). A negative result must be combined with clinical observations, patient history, and epidemiological information. The expected result is Negative.  Fact Sheet for Patients:  EntrepreneurPulse.com.au  Fact Sheet for Healthcare Providers:  IncredibleEmployment.be  This test is no t  yet approved or cleared by the [place] and  has been authorized for detection and/or diagnosis of SARS-CoV-2 by FDA under an Emergency Use Authorization (EUA). This EUA will remain  in effect (meaning this test can be used) for the duration of the COVID-19 declaration under Section 564(b)(1) of the Act, 21 U.S.C.section 360bbb-3(b)(1), unless the authorization is terminated  or revoked sooner.       Influenza A by PCR NEGATIVE NEGATIVE Final   Influenza B by PCR NEGATIVE NEGATIVE Final    Comment: (NOTE) The Xpert Xpress SARS-CoV-2/FLU/RSV plus assay is intended as an aid in the diagnosis of influenza from Nasopharyngeal swab specimens and should not be used as a sole basis for treatment. Nasal washings and aspirates are unacceptable for Xpert Xpress SARS-CoV-2/FLU/RSV testing.  Fact Sheet for Patients: EntrepreneurPulse.com.au  Fact Sheet for Healthcare Providers: IncredibleEmployment.be  This test is not yet approved or cleared by the [place] FDA and has been authorized for detection and/or diagnosis of SARS-CoV-2 by FDA under an Emergency Use Authorization (EUA). This EUA will remain in effect (meaning this test can be used) for the duration of the COVID-19 declaration under Section 564(b)(1)  of the Act, 21 U.S.C. section 360bbb-3(b)(1), unless the authorization is terminated or revoked.  Performed at [facility], [address] [place], [place] [address]      Labs: BNP (last 3 results) No results for input(s): BNP in the last 8760 hours. Basic Metabolic Panel: Recent Labs  Lab 09/17/20 1314 09/18/20 0643 09/19/20 0647 09/20/20 0610  NA 138 138 139 137  K 3.6 3.4* 3.5 3.6  CL 103 105 106 103  CO2 25 25 24 24   GLUCOSE 157* 101* 98 122*  BUN 12 10 11 11   CREATININE 1.15* 1.02* 1.03* 0.83  CALCIUM 8.5* 8.0* 8.2* 8.4*  MG  --   --  1.6* 1.7   Liver Function Tests: Recent Labs  Lab 09/17/20 1314 09/20/20 0610  AST 16 16  ALT 9 10  ALKPHOS 55 51  BILITOT 0.5 0.3  PROT 7.5 6.3*  ALBUMIN 3.6 2.9*   No results for input(s): LIPASE, AMYLASE in the last 168 hours. No results for input(s): AMMONIA in the last 168 hours. CBC: Recent Labs  Lab 09/17/20 1314 09/18/20 0008 09/19/20 0647 09/20/20 0610  WBC 7.3 7.7 7.8  --   HGB 12.9 11.1* 11.6* 10.9*  HCT 38.8 34.0* 36.1 33.9*  MCV 103.5* 104.6* 106.8*  --   PLT 217 200 229  --    Cardiac Enzymes: No results for input(s): CKTOTAL, CKMB, CKMBINDEX, TROPONINI in the last 168 hours. BNP: Invalid input(s): POCBNP CBG: Recent Labs  Lab 09/19/20 1649 09/19/20 1938 09/19/20 2319 09/20/20 0408 09/20/20 0740  GLUCAP 120* 120* 123* 121* 95   D-Dimer No results for input(s): DDIMER in the last 72 hours. Hgb A1c Recent Labs    09/17/20 1314  HGBA1C 6.4*   Lipid Profile No results for input(s): CHOL, HDL, LDLCALC, TRIG, CHOLHDL, LDLDIRECT in the last 72 hours. Thyroid function studies No results for input(s): TSH, T4TOTAL, T3FREE, THYROIDAB in the last 72 hours.  Invalid input(s): FREET3 Anemia work up No results for input(s): VITAMINB12, FOLATE, FERRITIN, TIBC, IRON, RETICCTPCT in the last 72 hours. Urinalysis    Component Value Date/Time   COLORURINE YELLOW 10/26/2010 1514    APPEARANCEUR CLOUDY (A) 10/26/2010 1514   LABSPEC 1.020 10/26/2010 1514   PHURINE 5.5 10/26/2010 1514   HGBUR SMALL (A) 10/26/2010 1514   BILIRUBINUR  NEGATIVE 10/26/2010 [place] 10/26/2010 1514   PROTEINUR NEGATIVE 10/26/2010 1514   UROBILINOGEN 0.2 10/26/2010 1514   NITRITE NEGATIVE 10/26/2010 1514   LEUKOCYTESUR NEGATIVE 10/26/2010 1514   Sepsis Labs Invalid input(s): PROCALCITONIN,  WBC,  LACTICIDVEN Microbiology Recent Results (from the past 240 hour(s))  Gastrointestinal Panel by PCR , Stool     Status: Abnormal   Collection Time: 09/17/20  5:50 PM   Specimen: Stool  Result Value Ref Range Status   Campylobacter species DETECTED (A) NOT DETECTED Final    Comment: RESULT CALLED TO, READ BACK BY AND VERIFIED WITH: NICHOLE GILBERT @2021  ON 09/17/20 SKL    Plesimonas shigelloides NOT DETECTED NOT DETECTED Final   Salmonella species NOT DETECTED NOT DETECTED Final   Yersinia enterocolitica NOT DETECTED NOT DETECTED Final   Vibrio species NOT DETECTED NOT DETECTED Final   Vibrio cholerae NOT DETECTED NOT DETECTED Final   Enteroaggregative E coli (EAEC) NOT DETECTED NOT DETECTED Final   Enteropathogenic E coli (EPEC) DETECTED (A) NOT DETECTED Final    Comment: RESULT CALLED TO, READ BACK BY AND VERIFIED WITH: NICHOLE GILBERT @2021  ON 09/17/20 SKL    Enterotoxigenic E coli (ETEC) NOT DETECTED NOT DETECTED Final   Shiga like toxin producing E coli (STEC) NOT DETECTED NOT DETECTED Final   Shigella/Enteroinvasive E coli (EIEC) NOT DETECTED NOT DETECTED Final   Cryptosporidium NOT DETECTED NOT DETECTED Final   Cyclospora cayetanensis NOT DETECTED NOT DETECTED Final   Entamoeba histolytica NOT DETECTED NOT DETECTED Final   Giardia lamblia NOT DETECTED NOT DETECTED Final   Adenovirus F40/41 NOT DETECTED NOT DETECTED Final   Astrovirus NOT DETECTED NOT DETECTED Final   Norovirus GI/GII NOT DETECTED NOT DETECTED Final   Rotavirus A NOT DETECTED NOT DETECTED Final    Sapovirus (I, II, IV, and V) NOT DETECTED NOT DETECTED Final    Comment: Performed at [facility], [address] [place], [place] [address]  C Difficile Quick Screen w PCR reflex     Status: None   Collection Time: 09/17/20  5:50 PM   Specimen: Stool  Result Value Ref Range Status   C Diff antigen NEGATIVE NEGATIVE Final   C Diff toxin NEGATIVE NEGATIVE Final   C Diff interpretation No C. difficile detected.  Final    Comment: Performed at [facility], [address] [place], [place] [address]  Resp Panel by RT-PCR (Flu A&B, Covid) Nasopharyngeal Swab     Status: None   Collection Time: 09/17/20  8:54 PM   Specimen: Nasopharyngeal Swab; Nasopharyngeal(NP) swabs in vial transport medium  Result Value Ref Range Status   SARS Coronavirus 2 by RT PCR NEGATIVE NEGATIVE Final    Comment: (NOTE) SARS-CoV-2 target nucleic acids are NOT DETECTED.  The SARS-CoV-2 RNA is generally detectable in upper respiratory specimens during the acute phase of infection. The lowest concentration of SARS-CoV-2 viral copies this assay can detect is 138 copies/mL. A negative result does not preclude SARS-Cov-2 infection and should not be used as the sole basis for treatment or other patient management decisions. A negative result may occur with  improper specimen collection/handling, submission of specimen other than nasopharyngeal swab, presence of viral mutation(s) within the areas targeted by this assay, and inadequate number of viral copies(<138 copies/mL). A negative result must be combined with clinical observations, patient history, and epidemiological information. The expected result is Negative.  Fact Sheet for Patients:  EntrepreneurPulse.com.au  Fact Sheet for Healthcare Providers:  IncredibleEmployment.be  This  test is no t yet approved or cleared by the [place] and  has been authorized for detection and/or diagnosis of  SARS-CoV-2 by FDA under an Emergency Use Authorization (EUA). This EUA will remain  in effect (meaning this test can be used) for the duration of the COVID-19 declaration under Section 564(b)(1) of the Act, 21 U.S.C.section 360bbb-3(b)(1), unless the authorization is terminated  or revoked sooner.       Influenza A by PCR NEGATIVE NEGATIVE Final   Influenza B by PCR NEGATIVE NEGATIVE Final    Comment: (NOTE) The Xpert Xpress SARS-CoV-2/FLU/RSV plus assay is intended as an aid in the diagnosis of influenza from Nasopharyngeal swab specimens and should not be used as a sole basis for treatment. Nasal washings and aspirates are unacceptable for Xpert Xpress SARS-CoV-2/FLU/RSV testing.  Fact Sheet for Patients: EntrepreneurPulse.com.au  Fact Sheet for Healthcare Providers: IncredibleEmployment.be  This test is not yet approved or cleared by the [place] FDA and has been authorized for detection and/or diagnosis of SARS-CoV-2 by FDA under an Emergency Use Authorization (EUA). This EUA will remain in effect (meaning this test can be used) for the duration of the COVID-19 declaration under Section 564(b)(1) of the Act, 21 U.S.C. section 360bbb-3(b)(1), unless the authorization is terminated or revoked.  Performed at [facility], [address] [place], [place] [address]      Time coordinating discharge: Over 30 minutes  SIGNED:   Ezekiel Slocumb, DO [facility] 09/20/2020, 12:17 PM   If 7PM-7AM, please contact night-coverage [email or web address]

## 2020-09-20 NOTE — Progress Notes (Signed)
09/20/2020 2:09 PM  Patient being discharged had medicines sent to [facility], which closed at noon today.  Spoke with discharging physician Dr Arbutus Ped and case manager Telford Nab who were trying to find a solution.  Patient insisted her ride was here and had to leave immediately and left before finding a solution.  She claimed she will pick up from [facility] on Monday.  Dola Argyle, RN

## 2020-09-23 NOTE — Anesthesia Postprocedure Evaluation (Signed)
Anesthesia Post Note  Patient: Jaime Fitzgerald  Procedure(s) Performed: ESOPHAGOGASTRODUODENOSCOPY (EGD) (N/A )  Patient location during evaluation: PACU Anesthesia Type: General Level of consciousness: awake and alert Pain management: pain level controlled Vital Signs Assessment: post-procedure vital signs reviewed and stable Respiratory status: spontaneous breathing, nonlabored ventilation and respiratory function stable Cardiovascular status: blood pressure returned to baseline and stable Postop Assessment: no apparent nausea or vomiting Anesthetic complications: no   No complications documented.   Last Vitals:  Vitals:   09/20/20 0412 09/20/20 0744  BP: 137/81 125/60  Pulse: 82 85  Resp: 20 20  Temp: 37 C 36.9 C  SpO2: 90% 94%    Last Pain:  Vitals:   09/20/20 0744  TempSrc: Oral  PainSc:                  Tera Mater

## 2021-02-12 ENCOUNTER — Other Ambulatory Visit: Payer: Self-pay | Admitting: Gastroenterology

## 2021-02-17 ENCOUNTER — Other Ambulatory Visit: Payer: Self-pay | Admitting: Gastroenterology

## 2021-02-17 DIAGNOSIS — R1013 Epigastric pain: Secondary | ICD-10-CM

## 2021-02-17 DIAGNOSIS — K250 Acute gastric ulcer with hemorrhage: Secondary | ICD-10-CM

## 2021-06-13 ENCOUNTER — Emergency Department: Payer: 59

## 2021-06-13 ENCOUNTER — Encounter: Payer: Self-pay | Admitting: Emergency Medicine

## 2021-06-13 ENCOUNTER — Other Ambulatory Visit: Payer: Self-pay

## 2021-06-13 ENCOUNTER — Emergency Department
Admission: EM | Admit: 2021-06-13 | Discharge: 2021-06-13 | Disposition: A | Discharge status: Home / Self Care | Payer: 59 | Attending: Emergency Medicine | Admitting: Emergency Medicine

## 2021-06-13 DIAGNOSIS — J45909 Unspecified asthma, uncomplicated: Secondary | ICD-10-CM | POA: Diagnosis not present

## 2021-06-13 DIAGNOSIS — Z79899 Other long term (current) drug therapy: Secondary | ICD-10-CM | POA: Insufficient documentation

## 2021-06-13 DIAGNOSIS — R609 Edema, unspecified: Secondary | ICD-10-CM | POA: Insufficient documentation

## 2021-06-13 DIAGNOSIS — R0602 Shortness of breath: Secondary | ICD-10-CM | POA: Insufficient documentation

## 2021-06-13 DIAGNOSIS — R1084 Generalized abdominal pain: Secondary | ICD-10-CM | POA: Insufficient documentation

## 2021-06-13 DIAGNOSIS — R42 Dizziness and giddiness: Secondary | ICD-10-CM | POA: Insufficient documentation

## 2021-06-13 DIAGNOSIS — Z20822 Contact with and (suspected) exposure to covid-19: Secondary | ICD-10-CM | POA: Diagnosis not present

## 2021-06-13 DIAGNOSIS — I10 Essential (primary) hypertension: Secondary | ICD-10-CM | POA: Diagnosis not present

## 2021-06-13 DIAGNOSIS — R0789 Other chest pain: Secondary | ICD-10-CM | POA: Insufficient documentation

## 2021-06-13 DIAGNOSIS — J449 Chronic obstructive pulmonary disease, unspecified: Secondary | ICD-10-CM | POA: Diagnosis not present

## 2021-06-13 DIAGNOSIS — R112 Nausea with vomiting, unspecified: Secondary | ICD-10-CM | POA: Diagnosis not present

## 2021-06-13 DIAGNOSIS — J9611 Chronic respiratory failure with hypoxia: Secondary | ICD-10-CM | POA: Diagnosis not present

## 2021-06-13 DIAGNOSIS — E119 Type 2 diabetes mellitus without complications: Secondary | ICD-10-CM | POA: Diagnosis not present

## 2021-06-13 DIAGNOSIS — R079 Chest pain, unspecified: Secondary | ICD-10-CM

## 2021-06-13 DIAGNOSIS — R519 Headache, unspecified: Secondary | ICD-10-CM

## 2021-06-13 LAB — PROCALCITONIN: Procalcitonin: <0.1 ng/mL

## 2021-06-13 LAB — BASIC METABOLIC PANEL
Anion gap: 5 (ref 5–15)
BUN: 14 mg/dL (ref 8–23)
CO2: 31 mmol/L (ref 22–32)
Calcium: 8.5 mg/dL — ABNORMAL LOW (ref 8.9–10.3)
Chloride: 101 mmol/L (ref 98–111)
Creatinine, Ser: 1.12 mg/dL — ABNORMAL HIGH (ref 0.44–1.00)
GFR, Estimated: 56 mL/min — ABNORMAL LOW (ref >60)
Glucose, Bld: 102 mg/dL — ABNORMAL HIGH (ref 70–99)
Potassium: 4 mmol/L (ref 3.5–5.1)
Sodium: 137 mmol/L (ref 135–145)

## 2021-06-13 LAB — HEPATIC FUNCTION PANEL
ALT: 12 U/L (ref 0–44)
AST: 15 U/L (ref 15–41)
Albumin: 3.8 g/dL (ref 3.5–5.0)
Alkaline Phosphatase: 61 U/L (ref 38–126)
Bilirubin, Direct: <0.1 mg/dL (ref 0.0–0.2)
Total Bilirubin: 0.6 mg/dL (ref 0.3–1.2)
Total Protein: 7 g/dL (ref 6.5–8.1)

## 2021-06-13 LAB — D-DIMER, QUANTITATIVE: D-Dimer, Quant: 1.05 ug/mL-FEU — ABNORMAL HIGH (ref 0.00–0.50)

## 2021-06-13 LAB — RESP PANEL BY RT-PCR (FLU A&B, COVID) ARPGX2
Influenza A by PCR: NEGATIVE
Influenza B by PCR: NEGATIVE
SARS Coronavirus 2 by RT PCR: NEGATIVE

## 2021-06-13 LAB — CBC
HCT: 40.3 % (ref 36.0–46.0)
Hemoglobin: 13.4 g/dL (ref 12.0–15.0)
MCH: 34.4 pg — ABNORMAL HIGH (ref 26.0–34.0)
MCHC: 33.3 g/dL (ref 30.0–36.0)
MCV: 103.3 fL — ABNORMAL HIGH (ref 80.0–100.0)
Platelets: 211 10*3/uL (ref 150–400)
RBC: 3.9 MIL/uL (ref 3.87–5.11)
RDW: 14.6 % (ref 11.5–15.5)
WBC: 7.6 10*3/uL (ref 4.0–10.5)
nRBC: 0 % (ref 0.0–0.2)

## 2021-06-13 LAB — TROPONIN I (HIGH SENSITIVITY)
Troponin I (High Sensitivity): 7 ng/L (ref <18)
Troponin I (High Sensitivity): 7 ng/L (ref <18)

## 2021-06-13 LAB — BRAIN NATRIURETIC PEPTIDE: B Natriuretic Peptide: 7.5 pg/mL (ref 0.0–100.0)

## 2021-06-13 MED ORDER — MAGNESIUM SULFATE 2 GM/50ML IV SOLN
2.0000 g | Freq: Once | INTRAVENOUS | Status: AC
  Administered 2021-06-13: 2 g via INTRAVENOUS
  Filled 2021-06-13: qty 50

## 2021-06-13 MED ORDER — ONDANSETRON HCL 4 MG/2ML IJ SOLN
4.0000 mg | Freq: Once | INTRAMUSCULAR | Status: AC
  Administered 2021-06-13: 4 mg via INTRAVENOUS
  Filled 2021-06-13: qty 2

## 2021-06-13 MED ORDER — IOHEXOL 350 MG/ML SOLN
100.0000 mL | Freq: Once | INTRAVENOUS | Status: AC | PRN
  Administered 2021-06-13: 100 mL via INTRAVENOUS
  Filled 2021-06-13: qty 100

## 2021-06-13 MED ORDER — PROCHLORPERAZINE EDISYLATE 10 MG/2ML IJ SOLN
10.0000 mg | Freq: Once | INTRAMUSCULAR | Status: AC
  Administered 2021-06-13: 10 mg via INTRAVENOUS
  Filled 2021-06-13: qty 2

## 2021-06-13 NOTE — ED Provider Notes (Signed)
[facility] Emergency Department Provider Note  ____________________________________________   Event Date/Time   First MD Initiated Contact with Patient 06/13/21 1751     (approximate)  I have reviewed the triage vital signs and the nursing notes.   HISTORY  Chief Complaint Chest Pain   HPI NIAJA BAIN is a 60 y.o. female with past medical history of COPD with 2 L nasal cannula as needed, ongoing tobacco abuse, seizures, OSA, HTN, DM, asthma, arthritis, and GI bleed who presents for assessment of complaints including approximate 2 to 3 days of headache, dizziness, chest pain, cough, shortness of breath, nonbloody nonbilious vomiting, abdominal bloating and distention and some swelling in both legs.  She denies any burning with urination, diarrhea but does endorse constipation.  She has been having bowel movements most recently yesterday but seem very hard.  She is not been coughing up anything.  She denies any earache, sore throat, rash or focal extremity pain weakness numbness or tingling.  No recent falls or injuries.  She denies any illicit or EtOH use.  She states that he has been using her oxygen as needed which is prescribed for COPD.  Denies any other acute concerns at this time.         Past Medical History:  Diagnosis Date   Acute blood loss anemia 10/11/2019   Acute GI bleeding 10/11/2019   Anginal pain ([place])    Arthritis    Asthma    Diabetes mellitus without complication ([place])    Dyspnea    Hypertension    Seizures ([place])    Sleep apnea     Patient Active Problem List   Diagnosis Date Noted   Infectious colitis 09/18/2020   History of gastric ulcer with hemorrhage 09/17/2020   Hematemesis 09/17/2020   Enteropathogenic Escherichia coli infection 09/17/2020   Campylobacter intestinal infection 09/17/2020   Melena 09/17/2020   Acute infective gastroenteritis 09/17/2020   Acute colitis 09/17/2020   Screening for colon cancer    Other  synovitis and tenosynovitis, unspecified hand 10/31/2019   Diverticulosis 10/31/2019   Migraine headache 10/31/2019   Ovarian cyst 10/31/2019   Pain in thoracic spine 10/31/2019   Seizures ([place]) 10/31/2019   Acute GI bleeding 10/11/2019   Hypertension 10/11/2019   AKI (acute kidney injury) ([place]) 10/11/2019   Syncope and collapse 10/11/2019   Type 2 diabetes mellitus ([place]) 10/11/2019   Acute blood loss anemia 10/11/2019   Acute gastric ulcer with hemorrhage     Past Surgical History:  Procedure Laterality Date   CARPAL TUNNEL RELEASE     COLONOSCOPY WITH PROPOFOL N/A 01/17/2020   Procedure: COLONOSCOPY WITH PROPOFOL;  Surgeon: Lin Landsman, MD;  Location: [facility] ENDOSCOPY;  Service: Gastroenterology;  Laterality: N/A;   ESOPHAGOGASTRODUODENOSCOPY N/A 10/13/2019   Procedure: ESOPHAGOGASTRODUODENOSCOPY (EGD);  Surgeon: Virgel Manifold, MD;  Location: [facility] ENDOSCOPY;  Service: Endoscopy;  Laterality: N/A;   ESOPHAGOGASTRODUODENOSCOPY N/A 09/18/2020   Procedure: ESOPHAGOGASTRODUODENOSCOPY (EGD);  Surgeon: [place], Benay Pike, MD;  Location: [facility] ENDOSCOPY;  Service: Gastroenterology;  Laterality: N/A;   ESOPHAGOGASTRODUODENOSCOPY (EGD) WITH PROPOFOL N/A 10/11/2019   Procedure: ESOPHAGOGASTRODUODENOSCOPY (EGD) WITH PROPOFOL;  Surgeon: Virgel Manifold, MD;  Location: [facility] ENDOSCOPY;  Service: Endoscopy;  Laterality: N/A;   ESOPHAGOGASTRODUODENOSCOPY (EGD) WITH PROPOFOL N/A 01/17/2020   Procedure: ESOPHAGOGASTRODUODENOSCOPY (EGD) WITH PROPOFOL;  Surgeon: Lin Landsman, MD;  Location: [facility] ENDOSCOPY;  Service: Gastroenterology;  Laterality: N/A;   TONSILLECTOMY      Prior to Admission medications   Medication  Sig Start Date End Date Taking? Authorizing Provider  albuterol (VENTOLIN HFA) 108 (90 Base) MCG/ACT inhaler Inhale 2 puffs into the lungs every 6 (six) hours as needed for wheezing or shortness of breath. 10/14/19   Nolberto Hanlon, MD  meclizine (ANTIVERT) 12.5 MG tablet Take  1 tablet (12.5 mg total) by mouth 3 (three) times daily as needed for up to 30 doses for dizziness. 09/20/20   Nicole Kindred A, DO  ondansetron (ZOFRAN) 4 MG tablet Take 1 tablet (4 mg total) by mouth every 6 (six) hours as needed for nausea. 09/20/20   Nicole Kindred A, DO  pantoprazole (PROTONIX) 40 MG tablet TAKE ONE TABLET BY MOUTH TWO TIMES A DAY BEFORE A MEAL Patient not taking: Reported on 09/19/2020 05/22/20   Lin Landsman, MD  pravastatin (PRAVACHOL) 40 MG tablet Take 40 mg by mouth daily. Patient not taking: Reported on 09/19/2020    [provider]  vitamin B-12 (CYANOCOBALAMIN) 1000 MCG tablet TAKE ONE TABLET BY MOUTH EVERY DAY 02/12/21   Lin Landsman, MD    Allergies Aspirin and Penicillins  No family history on file.  Social History Social History   Tobacco Use   Smoking status: Never   Smokeless tobacco: Never  Substance Use Topics   Alcohol use: Not Currently   Drug use: Not Currently    Review of Systems  Review of Systems  Constitutional:  Negative for chills and fever.  HENT:  Negative for hearing loss and sore throat.   Eyes:  Negative for pain.  Respiratory:  Positive for cough and shortness of breath. Negative for stridor.   Cardiovascular:  Positive for chest pain.  Gastrointestinal:  Positive for abdominal pain, constipation, nausea and vomiting.  Genitourinary:  Negative for dysuria.  Musculoskeletal:  Negative for myalgias.  Skin:  Negative for rash.  Neurological:  Positive for dizziness and headaches. Negative for seizures and loss of consciousness.  Psychiatric/Behavioral:  Negative for suicidal ideas.   All other systems reviewed and are negative.    ____________________________________________   PHYSICAL EXAM:  VITAL SIGNS: ED Triage Vitals  Enc Vitals Group     BP 06/13/21 1636 (!) 142/70     Pulse --      Resp 06/13/21 1636 18     Temp 06/13/21 1636 98.9 F (37.2 C)     Temp Source 06/13/21 1636 Oral      SpO2 06/13/21 1636 93 %     Weight 06/13/21 1638 270 lb (122.5 kg)     Height 06/13/21 1638 '5\' 3"'$  (1.6 m)     Head Circumference --      Peak Flow --      Pain Score --      Pain Loc --      Pain Edu? --      Excl. in [place]? --    Vitals:   06/13/21 1636  BP: (!) 142/70  Resp: 18  Temp: 98.9 F (37.2 C)  SpO2: 93%   Physical Exam Vitals and nursing note reviewed.  Constitutional:      General: She is not in acute distress.    Appearance: She is well-developed.  HENT:     Head: Normocephalic and atraumatic.     Right Ear: External ear normal.     Left Ear: External ear normal.     Nose: Nose normal.     Mouth/Throat:     Mouth: Mucous membranes are moist.  Eyes:     Conjunctiva/sclera: Conjunctivae normal.  Cardiovascular:     Rate and Rhythm: Normal rate and regular rhythm.     Heart sounds: No murmur heard. Pulmonary:     Effort: Pulmonary effort is normal. No respiratory distress.     Breath sounds: Normal breath sounds.  Abdominal:     General: There is distension.     Palpations: Abdomen is soft.     Tenderness: There is no abdominal tenderness.  Musculoskeletal:     Cervical back: Neck supple.     Right lower leg: Edema present.     Left lower leg: Edema present.  Skin:    General: Skin is warm and dry.  Neurological:     Mental Status: She is alert and oriented to person, place, and time.  Psychiatric:        Mood and Affect: Mood normal.    Cranial nerves II through XII grossly intact.  No pronator drift.  No finger dysmetria.  Symmetric 5/5 strength of all extremities.  Sensation intact to light touch in all extremities.   ____________________________________________   LABS (all labs ordered are listed, but only abnormal results are displayed)  Labs Reviewed  BASIC METABOLIC PANEL - Abnormal; Notable for the following components:      Result Value   Glucose, Bld 102 (*)    Creatinine, Ser 1.12 (*)    Calcium 8.5 (*)    GFR, Estimated 56 (*)     All other components within normal limits  CBC - Abnormal; Notable for the following components:   MCV 103.3 (*)    MCH 34.4 (*)    All other components within normal limits  RESP PANEL BY RT-PCR (FLU A&B, COVID) ARPGX2  BRAIN NATRIURETIC PEPTIDE  PROCALCITONIN  HEPATIC FUNCTION PANEL  URINALYSIS, COMPLETE (UACMP) WITH MICROSCOPIC  TROPONIN I (HIGH SENSITIVITY)  TROPONIN I (HIGH SENSITIVITY)   ____________________________________________  EKG  ECG shows normal sinus rhythm with a left septal deviation and some nonspecific ST changes in aVL, V2 without other clearance of acute ischemia or significant arrhythmia. ____________________________________________  RADIOLOGY  ED MD interpretation: Chest x-ray shows stable cardiomegaly without overt edema, focal consolidation, effusion, pneumothorax or other clear acute thoracic process.  Official radiology report(s): DG Chest 2 View  Result Date: 06/13/2021 CLINICAL DATA:  Worsening chest pain, shortness of breath, dizziness, cough, and vomiting for 12 hours, hypertension, diabetes mellitus EXAM: CHEST - 2 VIEW COMPARISON:  01/07/2017 FINDINGS: Enlargement of cardiac silhouette. Mediastinal contours and pulmonary vascularity normal. Lungs clear. No pulmonary infiltrate, pleural effusion, or pneumothorax. Respiratory motion artifacts degrade lateral view. No acute osseous findings. IMPRESSION: Enlargement of cardiac silhouette. No acute abnormalities. Electronically Signed   By: Lavonia Dana M.D.   On: 06/13/2021 17:30    ____________________________________________   PROCEDURES  Procedure(s) performed (including Critical Care):  Procedures   ____________________________________________   INITIAL IMPRESSION / ASSESSMENT AND PLAN / ED COURSE      Patient presents with above-stated history and exam for assessment of several complaints that have seemed all started today.  On arrival she was afebrile and hemodynamically stable with an  SPO2 of 93% on her home 2 L.  She is here for assessment of headache, dizziness, chest pain, cough, vomiting and some abdominal bloating generalized abdominal pain.  She has a nonfocal neuro exam and her lungs are clear bilaterally but her abdomen is mildly distended and mildly tender throughout.  With regard to her headache dizziness and vomiting differential includes metabolic derangements, SAH, primary headache.  No history or exam  features to suggest deep space infection in the head or neck or focal deficits to suggest CVA.  No history of recent trauma and I have a low suspicion for toxic ingestion.  BP of 142/70 is likely not contributing is not significantly elevated.  I will obtain a CT head to rule out SAH and treat with Compazine and magnesium while waiting CT head.  With regard to patient's chest pain cough differential includes ACS, pneumonia, PE, arrhythmia, anemia, ACS, myocarditis, pericarditis and bronchitis.  Chest x-ray has no evidence of effusion, edema, pneumothorax or other acute process.  ECG and nonelevated troponin x2 are not suggestive of ACS or myocarditis or significant arrhythmia.  CBC shows no leukocytosis or acute anemia.  BMP shows no significant electrode metabolic derangements.  I will obtain a COVID PCR and obtain a CTA chest to rule out a PE given borderline SPO2 and no significant wheezing to suggest acute COPD exacerbation.  Prior to her abdominal distention generalized crampiness and vomiting differential includes SBO, gastritis, diverticulitis, and cystitis.  There is no fever, CVA tenderness or leukocytosis to suggest pyelonephritis.  There is is not localized in the epigastrium and she has no clear risk factors to suggest pancreatitis.  Hepatic function panel is no evidence of hepatitis or cholestasis.  We will obtain a CT of the pelvis to rule out SBO, diverticulitis or other emergent abdominal pathology.  I will also order a UA to rule out cystitis.  Care  patient signed over to assuming provider approximately 8 PM.  Plan is to follow-up CT imaging studies, urinalysis and COVID PCR.  If these are all unremarkable I think patient should undergo trial of ambulation to test for hypoxia and if she is hypoxic and is able to tolerate p.o. likely stable for discharge with close outpatient PCP follow-up.   ____________________________________________   FINAL CLINICAL IMPRESSION(S) / ED DIAGNOSES  Final diagnoses:  Nonintractable headache, unspecified chronicity pattern, unspecified headache type  Nausea and vomiting, intractability of vomiting not specified, unspecified vomiting type  Chest pain, unspecified type  Dizziness  Generalized abdominal pain  Chronic respiratory failure with hypoxia (HCC)    Medications  prochlorperazine (COMPAZINE) injection 10 mg (has no administration in time range)  magnesium sulfate IVPB 2 g 50 mL (has no administration in time range)  ondansetron (ZOFRAN) injection 4 mg (4 mg Intravenous Given 06/13/21 1842)  iohexol (OMNIPAQUE) 350 MG/ML injection 100 mL (100 mLs Intravenous Contrast Given 06/13/21 1930)     ED Discharge Orders     None        Note:  This document was prepared using Dragon voice recognition software and may include unintentional dictation errors.    Lucrezia Starch, MD 06/13/21 [phone]

## 2021-06-13 NOTE — ED Notes (Signed)
Pt placed on 2L O2 per [place], states she is feeling more SHOB during triage. NAD.

## 2021-06-13 NOTE — ED Triage Notes (Signed)
Pt in via EMS from home with c/o CP, SOB, dizziness and vomiting for 12 hours getting worse. Pt also with wet cough, HR 80's, 133/83, 92-94% RA. Pt was given '324mg'$  of asa and 1 nitro spray with relief and '4mg'$  of zofran. #20 to right ac

## 2021-06-13 NOTE — ED Provider Notes (Signed)
  Physical Exam  BP 138/68 (BP Location: Right Arm)   Pulse 84   Temp 98.4 F (36.9 C) (Oral)   Resp 16   Ht '5\' 3"'$  (1.6 m)   Wt 122.5 kg   SpO2 93%   BMI 47.83 kg/m   Physical Exam  ED Course/Procedures     Procedures  MDM   Assumed patient care from Charlann Boxer, MD.  CT head showed no evidence of intracranial bleed or other acute abnormality.  CTs of the chest, abdomen and pelvis were unremarkable.  Patient requested work note and felt well enough to be discharged.  Return precautions were given to return with new or worsening symptoms.  All patient questions were answered.       Vallarie Mare [place], PA-C 06/13/21 2235    Lucrezia Starch, MD 06/14/21 [phone]

## 2021-06-13 NOTE — ED Triage Notes (Signed)
Pt here via EMS from home with c/o worsening CP, SOB, dizziness and vomiting for 12 hours. Pt also with cough. Given '324mg'$  of asa and 1 nitro spray en route with relief and '4mg'$  of zofran. NAD.

## 2021-08-03 ENCOUNTER — Other Ambulatory Visit: Payer: Self-pay | Admitting: Gastroenterology

## 2021-08-05 ENCOUNTER — Other Ambulatory Visit: Payer: Self-pay

## 2021-08-05 MED ORDER — VITAMIN B-12 1000 MCG PO TABS: 1000.0000 ug | ORAL_TABLET | Freq: Every day | ORAL | 1 refills | Status: AC

## 2021-08-05 NOTE — Progress Notes (Signed)
Sent in refill as requested for cyanocobalamin to [facility]

## 2021-10-13 ENCOUNTER — Inpatient Hospital Stay
Admission: EM | Admit: 2021-10-13 | Discharge: 2021-10-17 | DRG: 189 | Disposition: A | Discharge status: Home / Self Care | Payer: Medicaid Other | Attending: Internal Medicine | Admitting: Internal Medicine

## 2021-10-13 ENCOUNTER — Other Ambulatory Visit: Payer: Self-pay

## 2021-10-13 ENCOUNTER — Emergency Department: Payer: Medicaid Other

## 2021-10-13 DIAGNOSIS — E119 Type 2 diabetes mellitus without complications: Secondary | ICD-10-CM | POA: Diagnosis present

## 2021-10-13 DIAGNOSIS — J9601 Acute respiratory failure with hypoxia: Principal | ICD-10-CM | POA: Diagnosis present

## 2021-10-13 DIAGNOSIS — I5020 Unspecified systolic (congestive) heart failure: Secondary | ICD-10-CM | POA: Diagnosis present

## 2021-10-13 DIAGNOSIS — I5043 Acute on chronic combined systolic (congestive) and diastolic (congestive) heart failure: Secondary | ICD-10-CM

## 2021-10-13 DIAGNOSIS — Z635 Disruption of family by separation and divorce: Secondary | ICD-10-CM

## 2021-10-13 DIAGNOSIS — I10 Essential (primary) hypertension: Secondary | ICD-10-CM | POA: Diagnosis present

## 2021-10-13 DIAGNOSIS — R569 Unspecified convulsions: Secondary | ICD-10-CM | POA: Diagnosis present

## 2021-10-13 DIAGNOSIS — K250 Acute gastric ulcer with hemorrhage: Secondary | ICD-10-CM

## 2021-10-13 DIAGNOSIS — Z20822 Contact with and (suspected) exposure to covid-19: Secondary | ICD-10-CM | POA: Diagnosis present

## 2021-10-13 DIAGNOSIS — I3139 Other pericardial effusion (noninflammatory): Secondary | ICD-10-CM | POA: Diagnosis present

## 2021-10-13 DIAGNOSIS — J45909 Unspecified asthma, uncomplicated: Secondary | ICD-10-CM | POA: Diagnosis present

## 2021-10-13 DIAGNOSIS — J189 Pneumonia, unspecified organism: Secondary | ICD-10-CM | POA: Diagnosis present

## 2021-10-13 DIAGNOSIS — Z880 Allergy status to penicillin: Secondary | ICD-10-CM

## 2021-10-13 DIAGNOSIS — R1013 Epigastric pain: Secondary | ICD-10-CM

## 2021-10-13 DIAGNOSIS — N179 Acute kidney failure, unspecified: Secondary | ICD-10-CM | POA: Diagnosis present

## 2021-10-13 DIAGNOSIS — Z886 Allergy status to analgesic agent status: Secondary | ICD-10-CM

## 2021-10-13 DIAGNOSIS — I110 Hypertensive heart disease with heart failure: Secondary | ICD-10-CM | POA: Diagnosis present

## 2021-10-13 DIAGNOSIS — Z79899 Other long term (current) drug therapy: Secondary | ICD-10-CM

## 2021-10-13 DIAGNOSIS — Z6841 Body Mass Index (BMI) 40.0 and over, adult: Secondary | ICD-10-CM | POA: Diagnosis not present

## 2021-10-13 DIAGNOSIS — R0602 Shortness of breath: Secondary | ICD-10-CM | POA: Diagnosis not present

## 2021-10-13 LAB — HEPATIC FUNCTION PANEL
ALT: 11 U/L (ref 0–44)
AST: 18 U/L (ref 15–41)
Albumin: 3.9 g/dL (ref 3.5–5.0)
Alkaline Phosphatase: 59 U/L (ref 38–126)
Bilirubin, Direct: <0.1 mg/dL (ref 0.0–0.2)
Total Bilirubin: 0.5 mg/dL (ref 0.3–1.2)
Total Protein: 7.3 g/dL (ref 6.5–8.1)

## 2021-10-13 LAB — CBC
HCT: 44.3 % (ref 36.0–46.0)
Hemoglobin: 14.2 g/dL (ref 12.0–15.0)
MCH: 34.4 pg — ABNORMAL HIGH (ref 26.0–34.0)
MCHC: 32.1 g/dL (ref 30.0–36.0)
MCV: 107.3 fL — ABNORMAL HIGH (ref 80.0–100.0)
Platelets: 216 10*3/uL (ref 150–400)
RBC: 4.13 MIL/uL (ref 3.87–5.11)
RDW: 14.2 % (ref 11.5–15.5)
WBC: 6.9 10*3/uL (ref 4.0–10.5)
nRBC: 0 % (ref 0.0–0.2)

## 2021-10-13 LAB — BASIC METABOLIC PANEL
Anion gap: 9 (ref 5–15)
BUN: 22 mg/dL (ref 8–23)
CO2: 35 mmol/L — ABNORMAL HIGH (ref 22–32)
Calcium: 8.6 mg/dL — ABNORMAL LOW (ref 8.9–10.3)
Chloride: 95 mmol/L — ABNORMAL LOW (ref 98–111)
Creatinine, Ser: 1.15 mg/dL — ABNORMAL HIGH (ref 0.44–1.00)
GFR, Estimated: 54 mL/min — ABNORMAL LOW (ref >60)
Glucose, Bld: 127 mg/dL — ABNORMAL HIGH (ref 70–99)
Potassium: 3.8 mmol/L (ref 3.5–5.1)
Sodium: 139 mmol/L (ref 135–145)

## 2021-10-13 LAB — RESP PANEL BY RT-PCR (FLU A&B, COVID) ARPGX2
Influenza A by PCR: NEGATIVE
Influenza B by PCR: NEGATIVE
SARS Coronavirus 2 by RT PCR: NEGATIVE

## 2021-10-13 LAB — BRAIN NATRIURETIC PEPTIDE: B Natriuretic Peptide: 8.4 pg/mL (ref 0.0–100.0)

## 2021-10-13 LAB — TROPONIN I (HIGH SENSITIVITY): Troponin I (High Sensitivity): 10 ng/L (ref <18)

## 2021-10-13 LAB — LACTIC ACID, PLASMA: Lactic Acid, Venous: 1.6 mmol/L (ref 0.5–1.9)

## 2021-10-13 LAB — PROCALCITONIN: Procalcitonin: <0.1 ng/mL

## 2021-10-13 MED ORDER — FUROSEMIDE 10 MG/ML IJ SOLN: 40.0000 mg | Freq: Two times a day (BID) | INTRAMUSCULAR | Status: DC

## 2021-10-13 MED ORDER — ONDANSETRON HCL 4 MG/2ML IJ SOLN
4.0000 mg | Freq: Four times a day (QID) | INTRAMUSCULAR | Status: DC | PRN
Start: 2021-10-13 — End: 2021-10-17
  Administered 2021-10-13: 4 mg via INTRAVENOUS
  Filled 2021-10-13: qty 2

## 2021-10-13 MED ORDER — ACETAMINOPHEN 325 MG PO TABS
650.0000 mg | ORAL_TABLET | Freq: Four times a day (QID) | ORAL | Status: DC | PRN
  Administered 2021-10-14 – 2021-10-15 (×4): 650 mg via ORAL
  Filled 2021-10-13 (×4): qty 2

## 2021-10-13 MED ORDER — HYDRALAZINE HCL 20 MG/ML IJ SOLN: 5.0000 mg | INTRAMUSCULAR | Status: DC | PRN

## 2021-10-13 MED ORDER — SODIUM CHLORIDE 0.9% FLUSH
3.0000 mL | Freq: Two times a day (BID) | INTRAVENOUS | Status: DC
  Administered 2021-10-13 – 2021-10-16 (×6): 3 mL via INTRAVENOUS

## 2021-10-13 MED ORDER — ACETAMINOPHEN 650 MG RE SUPP: 650.0000 mg | Freq: Four times a day (QID) | RECTAL | Status: DC | PRN

## 2021-10-13 MED ORDER — IOHEXOL 350 MG/ML SOLN
100.0000 mL | Freq: Once | INTRAVENOUS | Status: AC | PRN
  Administered 2021-10-13: 100 mL via INTRAVENOUS

## 2021-10-13 MED ORDER — AZITHROMYCIN 500 MG IV SOLR
500.0000 mg | Freq: Once | INTRAVENOUS | Status: AC
  Administered 2021-10-13: 500 mg via INTRAVENOUS
  Filled 2021-10-13: qty 5

## 2021-10-13 MED ORDER — HEPARIN SODIUM (PORCINE) 5000 UNIT/ML IJ SOLN
5000.0000 [IU] | Freq: Three times a day (TID) | INTRAMUSCULAR | Status: DC
  Administered 2021-10-13 – 2021-10-14 (×3): 5000 [IU] via SUBCUTANEOUS
  Filled 2021-10-13 (×3): qty 1

## 2021-10-13 MED ORDER — ALBUTEROL SULFATE HFA 108 (90 BASE) MCG/ACT IN AERS
2.0000 | INHALATION_SPRAY | RESPIRATORY_TRACT | Status: DC | PRN
  Filled 2021-10-13: qty 6.7

## 2021-10-13 MED ORDER — ONDANSETRON HCL 4 MG PO TABS: 4.0000 mg | ORAL_TABLET | Freq: Four times a day (QID) | ORAL | Status: DC | PRN

## 2021-10-13 MED ORDER — FUROSEMIDE 10 MG/ML IJ SOLN
20.0000 mg | Freq: Two times a day (BID) | INTRAMUSCULAR | Status: DC
  Administered 2021-10-13 – 2021-10-14 (×2): 20 mg via INTRAVENOUS
  Filled 2021-10-13 (×2): qty 4

## 2021-10-13 MED ORDER — CEFTRIAXONE SODIUM 1 G IJ SOLR
1.0000 g | Freq: Once | INTRAMUSCULAR | Status: AC
  Administered 2021-10-13: 1 g via INTRAVENOUS
  Filled 2021-10-13: qty 10

## 2021-10-13 MED ORDER — PANTOPRAZOLE SODIUM 40 MG IV SOLR
40.0000 mg | INTRAVENOUS | Status: DC
  Administered 2021-10-13 – 2021-10-16 (×4): 40 mg via INTRAVENOUS
  Filled 2021-10-13 (×4): qty 40

## 2021-10-13 MED ORDER — IPRATROPIUM-ALBUTEROL 0.5-2.5 (3) MG/3ML IN SOLN
3.0000 mL | Freq: Once | RESPIRATORY_TRACT | Status: AC
  Administered 2021-10-13: 3 mL via RESPIRATORY_TRACT
  Filled 2021-10-13: qty 3

## 2021-10-13 MED ORDER — SUCRALFATE 1 GM/10ML PO SUSP
1.0000 g | Freq: Three times a day (TID) | ORAL | Status: DC
  Administered 2021-10-13 – 2021-10-17 (×12): 1 g via ORAL
  Filled 2021-10-13 (×19): qty 10

## 2021-10-13 NOTE — H&P (Signed)
History and Physical    Jaime Fitzgerald:[id number] DOB: 1961/04/21 DOA: 10/13/2021  PCP: [facility]    Patient coming from:  Home   Chief Complaint:  Shortness of breath   HPI:  Jaime Fitzgerald is a 61 y.o. female seen in ed with complaints of presents with complaints of shortness of breath has been going on for the past 2 to 3 weeks patient reports that it has been progressively getting worse and initially started with a cold upper respiratory kind of congestion symptoms.  Patient is wanting to stay on exertion patient also reported orthopnea.  She went to PCP for her back pain.  Has been going on for about 2 to 3 weeks was noted to be hypoxic and was placed on oxygen.  Review of systems is negative for headaches, speech or gait chest pain palpitations abdominal pain fevers and chills.    Pt has past medical history of hypertension, acute blood loss anemia with history of GI bleed, asthma, diabetes mellitus type 2, seizures, sleep apnea, dyspnea, obesity. ED Course:   Vitals:   10/13/21 2000 10/13/21 2100 10/13/21 2130 10/13/21 2300  BP: (!) 153/83 (!) 163/102 (!) 159/86 (!) 148/95  Pulse: 93 97 91 88  Resp: (!) 24 (!) 28 16 18   Temp:      TempSrc:      SpO2: 97% 97% 95% 97%  In the emergency room initial vitals were pulse of 88 respirations of 20 blood pressure 128/85 O2 sat 95% on 2 L. Blood work is as follows: CMP showing glucose of 127 acute kidney injury with a creatinine of 1.15, normal LFTs, troponin of 10 and BNP of 8.4, CBC shows normal white count and hemoglobin of 14.2 microcytosis with MCV of 107.3. Resp panel negative for flu and covid.    Review of Systems:  Review of Systems  Constitutional:  Positive for malaise/fatigue.  Respiratory:  Positive for shortness of breath.   Cardiovascular:  Positive for orthopnea and leg swelling.  All other systems reviewed and are negative.    Past Medical History:  Diagnosis Date   Acute blood  loss anemia 10/11/2019   Acute GI bleeding 10/11/2019   Anginal pain (HCC)    Arthritis    Asthma    Diabetes mellitus without complication (HCC)    Dyspnea    Hypertension    Seizures ([place])    Sleep apnea     Past Surgical History:  Procedure Laterality Date   CARPAL TUNNEL RELEASE     COLONOSCOPY WITH PROPOFOL N/A 01/17/2020   Procedure: COLONOSCOPY WITH PROPOFOL;  Surgeon: Lin Landsman, MD;  Location: [facility] ENDOSCOPY;  Service: Gastroenterology;  Laterality: N/A;   ESOPHAGOGASTRODUODENOSCOPY N/A 10/13/2019   Procedure: ESOPHAGOGASTRODUODENOSCOPY (EGD);  Surgeon: Virgel Manifold, MD;  Location: [facility] ENDOSCOPY;  Service: Endoscopy;  Laterality: N/A;   ESOPHAGOGASTRODUODENOSCOPY N/A 09/18/2020   Procedure: ESOPHAGOGASTRODUODENOSCOPY (EGD);  Surgeon: Tol[place], Benay Pike, MD;  Location: [facility] ENDOSCOPY;  Service: Gastroenterology;  Laterality: N/A;   ESOPHAGOGASTRODUODENOSCOPY (EGD) WITH PROPOFOL N/A 10/11/2019   Procedure: ESOPHAGOGASTRODUODENOSCOPY (EGD) WITH PROPOFOL;  Surgeon: Virgel Manifold, MD;  Location: [facility] ENDOSCOPY;  Service: Endoscopy;  Laterality: N/A;   ESOPHAGOGASTRODUODENOSCOPY (EGD) WITH PROPOFOL N/A 01/17/2020   Procedure: ESOPHAGOGASTRODUODENOSCOPY (EGD) WITH PROPOFOL;  Surgeon: Lin Landsman, MD;  Location: [facility] ENDOSCOPY;  Service: Gastroenterology;  Laterality: N/A;   TONSILLECTOMY       reports that she has never smoked. She has never used smokeless tobacco. She reports  that she does not currently use alcohol. She reports that she does not currently use drugs.  Allergies  Allergen Reactions   Aspirin Nausea Only and Other (See Comments)    GI upset   Penicillins Rash    Family History  Problem Relation Age of Onset   Heart Problems Mother    Seizures Father     Prior to Admission medications   Medication Sig Start Date End Date Taking? Authorizing Provider  albuterol (VENTOLIN HFA) 108 (90 Base) MCG/ACT inhaler Inhale 2  puffs into the lungs every 6 (six) hours as needed for wheezing or shortness of breath. 10/14/19   Nolberto Hanlon, MD  meclizine (ANTIVERT) 12.5 MG tablet Take 1 tablet (12.5 mg total) by mouth 3 (three) times daily as needed for up to 30 doses for dizziness. 09/20/20   Nicole Kindred A, DO  ondansetron (ZOFRAN) 4 MG tablet Take 1 tablet (4 mg total) by mouth every 6 (six) hours as needed for nausea. 09/20/20   Nicole Kindred A, DO  pantoprazole (PROTONIX) 40 MG tablet TAKE ONE TABLET BY MOUTH TWO TIMES A DAY BEFORE A MEAL Patient not taking: Reported on 09/19/2020 05/22/20   Lin Landsman, MD  pravastatin (PRAVACHOL) 40 MG tablet Take 40 mg by mouth daily. Patient not taking: Reported on 09/19/2020    [provider]  vitamin B-12 (CYANOCOBALAMIN) 1000 MCG tablet TAKE ONE TABLET BY MOUTH EVERY DAY 08/05/21   Vanga, Tally Due, MD  vitamin B-12 (CYANOCOBALAMIN) 1000 MCG tablet Take 1 tablet (1,000 mcg total) by mouth daily. 08/05/21   Lin Landsman, MD    Physical Exam: Vitals:   10/13/21 2000 10/13/21 2100 10/13/21 2130 10/13/21 2300  BP: (!) 153/83 (!) 163/102 (!) 159/86 (!) 148/95  Pulse: 93 97 91 88  Resp: (!) 24 (!) 28 16 18   Temp:      TempSrc:      SpO2: 97% 97% 95% 97%   Physical Exam Vitals reviewed.  Constitutional:      Appearance: She is not ill-appearing.     Interventions: She is not intubated. HENT:     Head: Normocephalic and atraumatic.     Right Ear: External ear normal.     Left Ear: External ear normal.     Nose: Nose normal.     Mouth/Throat:     Mouth: Mucous membranes are moist.  Eyes:     Extraocular Movements: Extraocular movements intact.     Pupils: Pupils are equal, round, and reactive to light.  Neck:     Vascular: No carotid bruit.  Cardiovascular:     Rate and Rhythm: Normal rate and regular rhythm.     Pulses: Normal pulses.     Heart sounds: Normal heart sounds.  Pulmonary:     Effort: Pulmonary effort is normal. No  tachypnea, bradypnea, accessory muscle usage, prolonged expiration, respiratory distress or retractions. She is not intubated.     Breath sounds: Normal breath sounds.    Abdominal:     General: Bowel sounds are normal. There is no distension.     Palpations: Abdomen is soft. There is no mass.     Tenderness: There is no abdominal tenderness. There is no guarding.     Hernia: No hernia is present.  Musculoskeletal:     Right lower leg: Edema present.     Left lower leg: Edema present.  Skin:    General: Skin is warm.  Neurological:     General: No focal deficit  present.     Mental Status: She is alert and oriented to person, place, and time.  Psychiatric:        Mood and Affect: Mood normal.        Behavior: Behavior normal.     Labs on Admission: I have personally reviewed following labs and imaging studies No results for input(s): CKTOTAL, CKMB, TROPONINI in the last 72 hours. Lab Results  Component Value Date   WBC 6.9 10/13/2021   HGB 14.2 10/13/2021   HCT 44.3 10/13/2021   MCV 107.3 (H) 10/13/2021   PLT 216 10/13/2021    Recent Labs  Lab 10/13/21 1013  NA 139  K 3.8  CL 95*  CO2 35*  BUN 22  CREATININE 1.15*  CALCIUM 8.6*  PROT 7.3  BILITOT 0.5  ALKPHOS 59  ALT 11  AST 18  GLUCOSE 127*   No results found for: CHOL, HDL, LDLCALC, TRIG Lab Results  Component Value Date   DDIMER 1.05 (H) 06/13/2021   Invalid input(s): POCBNP   COVID-19 Labs No results for input(s): DDIMER, FERRITIN, LDH, CRP in the last 72 hours. Lab Results  Component Value Date   SARSCOV2NAA NEGATIVE 10/13/2021   SARSCOV2NAA NEGATIVE 06/13/2021   [place] NEGATIVE 09/17/2020   [place] NEGATIVE 01/15/2020    Radiological Exams on Admission: DG Chest 2 View  Result Date: 10/13/2021 CLINICAL DATA:  Shortness of breath EXAM: CHEST - 2 VIEW COMPARISON:  06/13/2021 FINDINGS: Enlarged cardiac silhouette is again identified, which appears to be primarily due to prominent fat.  Patchy density at the right lung base. No pleural effusion. No acute osseous abnormality. IMPRESSION: Patchy atelectasis/consolidation at the right lung base. Electronically Signed   By: Macy Mis M.D.   On: 10/13/2021 10:10   CT Angio Chest PE W and/or Wo Contrast  Result Date: 10/13/2021 CLINICAL DATA:  Hypoxic EXAM: CT ANGIOGRAPHY CHEST WITH CONTRAST TECHNIQUE: Multidetector CT imaging of the chest was performed using the standard protocol during bolus administration of intravenous contrast. Multiplanar CT image reconstructions and MIPs were obtained to evaluate the vascular anatomy. CONTRAST:  171mL OMNIPAQUE IOHEXOL 350 MG/ML SOLN COMPARISON:  Chest x-ray 10/13/2021, CT chest 06/13/2021 FINDINGS: Cardiovascular: Satisfactory opacification of the pulmonary arteries to the segmental level. No evidence of pulmonary embolism. Nonaneurysmal aorta. Mild atherosclerosis. No dissection. Cardiomegaly. Increased pericardial effusion, small moderate measuring 15 mm maximum thickness left cardiac margin. Mediastinum/Nodes: Midline trachea. No thyroid mass. Enlarged right paratracheal lymph nodes measuring up to 17 mm without significant change. Small right hilar lymph nodes. Esophagus normal Lungs/Pleura: Mild subpleural reticulation and emphysema. No consolidation, pleural effusion or pneumothorax Upper Abdomen: No acute abnormality. Musculoskeletal: No chest wall abnormality. No acute or significant osseous findings. Review of the MIP images confirms the above findings. IMPRESSION: 1. Negative for acute pulmonary embolus or aortic dissection 2. Cardiomegaly with increased small moderate pericardial effusion 3. Mild subpleural reticulation consistent with chronic lung disease. No acute airspace disease 4. Similar mild mediastinal adenopathy Aortic Atherosclerosis (ICD10-I70.0) and Emphysema (ICD10-J43.9). Electronically Signed   By: Donavan Foil M.D.   On: 10/13/2021 19:49    EKG: Independently reviewed.   Normal sinus rhythm 89 right atrial enlargement, Q waves in V1 V2.   Assessment/Plan: Principal Problem:   SOB (shortness of breath) Active Problems:   Acute respiratory failure with hypoxia (HCC)   AKI (acute kidney injury) ([place])   Hypertension   Seizures (HCC)   Type 2 diabetes mellitus (HCC)   Shortness of breath/acute respiratory failure with hypoxia:  SpO2: 97 % O2 Flow Rate (L/min): 2 L/min Patient presents with shortness of breath that is been going on for nearly 2 to 3 weeks with worsening leg swelling and dyspnea on exertion orthopnea does not have any history of heart failure that she knows of.  Acute kidney injury: We will avoid all contrast studies. Lab Results  Component Value Date   CREATININE 1.15 (H) 10/13/2021   CREATININE 1.12 (H) 06/13/2021   CREATININE 0.83 09/20/2020  We will monitor and renally dose all needed medications.  Hypertension: Blood pressure (!) 148/95, pulse 88, temperature 98.5 F (36.9 C), temperature source Oral, resp. rate 18, SpO2 97 %. No home blood pressure medications.  We will start as needed hydralazine.   Seizures: Seizure precautions.  Diabetes mellitus type 2: A1c, Accu-Cheks, glycemic protocol.    DVT prophylaxis:  heparin   Code Status:  Full code    Family Communication:  Bos[place],Jerry Denman George)  [phone] (Mobile)   Disposition Plan:  Home    Consults called:  None   Admission status: Inpatient.     Medical Decision Making   Coding    Para Skeans MD [facility]  6 PM- 2 AM. Please contact me via secure Chat 6 PM-2 AM. To contact the [facility] Attending or Consulting provider [place] or covering provider during after hours [place], for this patient.   Check the care team in [facility] and look for a) attending/consulting [facility] provider listed and b) the [facility] team listed Log into [email or web address] and use [place]'s universal password to access. If you do not have the password, please contact the hospital  operator. Locate the [facility] provider you are looking for under [facility] and page to a number that you can be directly reached. If you still have difficulty reaching the provider, please page the [facility] (Director on Call) for the Hospitalists listed on amion for assistance. [email or web address] 10/14/2021, 1:42 AM

## 2021-10-13 NOTE — ED Notes (Signed)
Report received from Olivia,RN

## 2021-10-13 NOTE — ED Triage Notes (Signed)
Pt comes via EMS from [place] [facility] with c/o SOb. Pt went to doc for back pain of 4 weeks. Pt was found to be in 82%RA at office. EMS arrived and pt was on 2L at 96 %. Other vitals stable.  Pt states she has not been able to sleep for two nights. Pt has been using inhaler more recently.

## 2021-10-13 NOTE — ED Provider Notes (Signed)
[facility] Provider Note    Event Date/Time   First MD Initiated Contact with Patient 10/13/21 1505     (approximate)   History   Shortness of Breath   HPI  Jaime Fitzgerald is a 61 y.o. female   here with shortness of breath.  The patient states that for the last 2 to 3 weeks, she separates worsening shortness of breath.  She states that her symptoms started with a cold-like symptom approximately 3 weeks ago, with cough and congestion.  Over the last several days, she has had worsening shortness of breath and now is having difficulty even getting around the house.  She has had increasing lower extremity swelling.  She states she is had orthopnea and is having to sleep more upright.  She states she has been weak, so she went to her doctor today.  At her doctor, she was noted to be hypoxic and was placed on 2 L and sent here.  No history of hypoxia.  She has had some continued sputum production, but denies known fevers.  She said general malaise.  She has had bilateral leg swelling.  Denies known history of heart failure.  No recent medication changes.  No recent sick contacts.      Physical Exam   Triage Vital Signs: ED Triage Vitals  Enc Vitals Group     BP 10/13/21 1013 (!) 158/85     Pulse Rate 10/13/21 1013 88     Resp 10/13/21 1013 20     Temp 10/13/21 1013 98.5 F (36.9 C)     Temp Source 10/13/21 1013 Oral     SpO2 10/13/21 1013 95 %     Weight --      Height --      Head Circumference --      Peak Flow --      Pain Score 10/13/21 0942 0     Pain Loc --      Pain Edu? --      Excl. in [place]? --     Most recent vital signs: Vitals:   10/13/21 2100 10/13/21 2130  BP: (!) 163/102 (!) 159/86  Pulse: 97 91  Resp: (!) 28 16  Temp:    SpO2: 97% 95%     General: Awake, no distress.  CV:  Good peripheral perfusion.  No murmurs or rubs. Resp:  Mild tachypnea noted, bibasilar rales with diminished aeration. Abd:  No distention.  No guarding or  rebound. Other:  1+ pitting edema bilateral lower extremities.  No calf tenderness.   ED Results / Procedures / Treatments   Labs (all labs ordered are listed, but only abnormal results are displayed) Labs Reviewed  CBC - Abnormal; Notable for the following components:      Result Value   MCV 107.3 (*)    MCH 34.4 (*)    All other components within normal limits  BASIC METABOLIC PANEL - Abnormal; Notable for the following components:   Chloride 95 (*)    CO2 35 (*)    Glucose, Bld 127 (*)    Creatinine, Ser 1.15 (*)    Calcium 8.6 (*)    GFR, Estimated 54 (*)    All other components within normal limits  RESP PANEL BY RT-PCR (FLU A&B, COVID) ARPGX2  CULTURE, BLOOD (ROUTINE X 2)  CULTURE, BLOOD (ROUTINE X 2)  BRAIN NATRIURETIC PEPTIDE  PROCALCITONIN  HEPATIC FUNCTION PANEL  LACTIC ACID, PLASMA  LACTIC ACID, PLASMA  HEMOGLOBIN  A1C  T4, FREE  TSH  HIV ANTIBODY (ROUTINE TESTING W REFLEX)  BASIC METABOLIC PANEL  CBC  CBC  CREATININE, SERUM  CBC WITH DIFFERENTIAL/PLATELET  TROPONIN I (HIGH SENSITIVITY)  TROPONIN I (HIGH SENSITIVITY)     EKG Normal sinus rhythm, ventricular rate 89.  PR 130, QRS 70, QTc 474.  No acute ST elevations or depressions.  EKG evidence of acute ischemia or infarct.    RADIOLOGY Chest x-ray: Patchy consolidation at the right lung base, reviewed by me.  Agree with radiology interpretation.    PROCEDURES:  Critical Care performed: No   .1-3 Lead EKG Interpretation Performed by: Duffy Bruce, MD Authorized by: Duffy Bruce, MD     Interpretation: normal     ECG rate:  70-90   ECG rate assessment: normal     Rhythm: sinus rhythm     Ectopy: none     Conduction: normal   Comments:     Indication: Weakness    MEDICATIONS ORDERED IN ED: Medications  albuterol (VENTOLIN HFA) 108 (90 Base) MCG/ACT inhaler 2 puff (has no administration in time range)  sodium chloride flush (NS) 0.9 % injection 3 mL (3 mLs Intravenous Given  10/13/21 2132)  acetaminophen (TYLENOL) tablet 650 mg (has no administration in time range)    Or  acetaminophen (TYLENOL) suppository 650 mg (has no administration in time range)  ondansetron (ZOFRAN) tablet 4 mg ( Oral See Alternative 10/13/21 2127)    Or  ondansetron (ZOFRAN) injection 4 mg (4 mg Intravenous Given 10/13/21 2127)  hydrALAZINE (APRESOLINE) injection 5 mg (has no administration in time range)  heparin injection 5,000 Units (5,000 Units Subcutaneous Given 10/13/21 2128)  furosemide (LASIX) injection 20 mg (20 mg Intravenous Given 10/13/21 2126)  pantoprazole (PROTONIX) injection 40 mg (40 mg Intravenous Given 10/13/21 2234)  sucralfate (CARAFATE) 1 GM/10ML suspension 1 g (1 g Oral Given 10/13/21 2234)  cefTRIAXone (ROCEPHIN) 1 g in sodium chloride 0.9 % 100 mL IVPB (0 g Intravenous Stopped 10/13/21 1904)  azithromycin (ZITHROMAX) 500 mg in sodium chloride 0.9 % 250 mL IVPB (0 mg Intravenous Stopped 10/13/21 2109)  ipratropium-albuterol (DUONEB) 0.5-2.5 (3) MG/3ML nebulizer solution 3 mL (3 mLs Nebulization Given 10/13/21 1813)  iohexol (OMNIPAQUE) 350 MG/ML injection 100 mL (100 mLs Intravenous Contrast Given 10/13/21 1932)     IMPRESSION / MDM / ASSESSMENT AND PLAN / ED COURSE  I reviewed the triage vital signs and the nursing notes.                               The patient is on the cardiac monitor to evaluate for evidence of arrhythmia and/or significant heart rate changes.   Ddx: CAP, CHF, renal failure w/ pulm edema, ACS, COVID, viral URI, liver failure  61 year old female here with shortness of breath and general fatigue.  Patient has a history of diabetes, anemia, PUD, but no known history of lung disease.  Patient hypoxic with mild increased work of breathing here.  Chest x-ray reviewed, does show possible patchy consolidation in the right lung concerning for pneumonia.  She has no leukocytosis or evidence of sepsis.  Kidney function is at baseline.  COVID and influenza negative.   Lactic acid normal.  BNP and troponin negative.  LFTs normal.  Given increased work of breathing and hypoxia with shortness of breath, will admit for further work-up.  Will obtain CT angio given somewhat atypical presentation and history.  No known history  of PE.  Reviewed her previous records which show previous admissions for gastric ulcers.  She does not appear to be acutely anemic here.  No mention of significant lung disease.   MEDICATIONS GIVEN IN ED: Medications  albuterol (VENTOLIN HFA) 108 (90 Base) MCG/ACT inhaler 2 puff (has no administration in time range)  sodium chloride flush (NS) 0.9 % injection 3 mL (3 mLs Intravenous Given 10/13/21 2132)  acetaminophen (TYLENOL) tablet 650 mg (has no administration in time range)    Or  acetaminophen (TYLENOL) suppository 650 mg (has no administration in time range)  ondansetron (ZOFRAN) tablet 4 mg ( Oral See Alternative 10/13/21 2127)    Or  ondansetron (ZOFRAN) injection 4 mg (4 mg Intravenous Given 10/13/21 2127)  hydrALAZINE (APRESOLINE) injection 5 mg (has no administration in time range)  heparin injection 5,000 Units (5,000 Units Subcutaneous Given 10/13/21 2128)  furosemide (LASIX) injection 20 mg (20 mg Intravenous Given 10/13/21 2126)  pantoprazole (PROTONIX) injection 40 mg (40 mg Intravenous Given 10/13/21 2234)  sucralfate (CARAFATE) 1 GM/10ML suspension 1 g (1 g Oral Given 10/13/21 2234)  cefTRIAXone (ROCEPHIN) 1 g in sodium chloride 0.9 % 100 mL IVPB (0 g Intravenous Stopped 10/13/21 1904)  azithromycin (ZITHROMAX) 500 mg in sodium chloride 0.9 % 250 mL IVPB (0 mg Intravenous Stopped 10/13/21 2109)  ipratropium-albuterol (DUONEB) 0.5-2.5 (3) MG/3ML nebulizer solution 3 mL (3 mLs Nebulization Given 10/13/21 1813)  iohexol (OMNIPAQUE) 350 MG/ML injection 100 mL (100 mLs Intravenous Contrast Given 10/13/21 1932)    Consults: Hospitalist Dr. Posey Pronto, discussed and will admit.   EMR reviewed  Prior EGD with Dr. Marius Ditch, Dr. Bonna Gains     FINAL  CLINICAL IMPRESSION(S) / ED DIAGNOSES   Final diagnoses:  Acute respiratory failure with hypoxemia [facility])  Community acquired pneumonia of right lower lobe of lung     Rx / DC Orders   ED Discharge Orders     None        Note:  This document was prepared using Dragon voice recognition software and may include unintentional dictation errors.   Duffy Bruce, MD 10/13/21 2350

## 2021-10-13 NOTE — ED Notes (Signed)
Report called to York Cerise, RN in [place] prior to patient moving.

## 2021-10-13 NOTE — ED Notes (Signed)
Pt care taken, just finished antibiotic and is requesting something for abdominal pain

## 2021-10-14 ENCOUNTER — Inpatient Hospital Stay
Admit: 2021-10-14 | Discharge: 2021-10-14 | Disposition: A | Discharge status: Home / Self Care | Payer: Medicaid Other | Attending: Internal Medicine | Admitting: Internal Medicine

## 2021-10-14 LAB — ECHOCARDIOGRAM COMPLETE
AR max vel: 2.16 cm2
AV Area VTI: 2.15 cm2
AV Area mean vel: 2.07 cm2
AV Mean grad: 6 mmHg
AV Peak grad: 11.2 mmHg
Ao pk vel: 1.67 m/s
Area-P 1/2: 4.6 cm2
MV VTI: 3.19 cm2
S' Lateral: 3.55 cm

## 2021-10-14 LAB — BASIC METABOLIC PANEL
Anion gap: 8 (ref 5–15)
BUN: 16 mg/dL (ref 8–23)
CO2: 36 mmol/L — ABNORMAL HIGH (ref 22–32)
Calcium: 8.3 mg/dL — ABNORMAL LOW (ref 8.9–10.3)
Chloride: 96 mmol/L — ABNORMAL LOW (ref 98–111)
Creatinine, Ser: 0.99 mg/dL (ref 0.44–1.00)
GFR, Estimated: >60 mL/min (ref >60)
Glucose, Bld: 148 mg/dL — ABNORMAL HIGH (ref 70–99)
Potassium: 4.2 mmol/L (ref 3.5–5.1)
Sodium: 140 mmol/L (ref 135–145)

## 2021-10-14 LAB — CBC WITH DIFFERENTIAL/PLATELET
Abs Immature Granulocytes: 0.02 10*3/uL (ref 0.00–0.07)
Basophils Absolute: 0 10*3/uL (ref 0.0–0.1)
Basophils Relative: 0 %
Eosinophils Absolute: 0.2 10*3/uL (ref 0.0–0.5)
Eosinophils Relative: 3 %
HCT: 44.5 % (ref 36.0–46.0)
Hemoglobin: 14.2 g/dL (ref 12.0–15.0)
Immature Granulocytes: 0 %
Lymphocytes Relative: 32 %
Lymphs Abs: 1.8 10*3/uL (ref 0.7–4.0)
MCH: 34.5 pg — ABNORMAL HIGH (ref 26.0–34.0)
MCHC: 31.9 g/dL (ref 30.0–36.0)
MCV: 108 fL — ABNORMAL HIGH (ref 80.0–100.0)
Monocytes Absolute: 0.4 10*3/uL (ref 0.1–1.0)
Monocytes Relative: 8 %
Neutro Abs: 3.2 10*3/uL (ref 1.7–7.7)
Neutrophils Relative %: 57 %
Platelets: 221 10*3/uL (ref 150–400)
RBC: 4.12 MIL/uL (ref 3.87–5.11)
RDW: 14.1 % (ref 11.5–15.5)
WBC: 5.6 10*3/uL (ref 4.0–10.5)
nRBC: 0 % (ref 0.0–0.2)

## 2021-10-14 LAB — TSH: TSH: 1.955 u[IU]/mL (ref 0.350–4.500)

## 2021-10-14 LAB — T4, FREE: Free T4: 0.89 ng/dL (ref 0.61–1.12)

## 2021-10-14 LAB — HEMOGLOBIN A1C
Hgb A1c MFr Bld: 7.2 % — ABNORMAL HIGH (ref 4.8–5.6)
Mean Plasma Glucose: 159.94 mg/dL

## 2021-10-14 LAB — HIV ANTIBODY (ROUTINE TESTING W REFLEX): HIV Screen 4th Generation wRfx: NONREACTIVE

## 2021-10-14 LAB — TROPONIN I (HIGH SENSITIVITY): Troponin I (High Sensitivity): 12 ng/L (ref <18)

## 2021-10-14 LAB — LACTIC ACID, PLASMA: Lactic Acid, Venous: 1.1 mmol/L (ref 0.5–1.9)

## 2021-10-14 MED ORDER — FUROSEMIDE 10 MG/ML IJ SOLN
40.0000 mg | Freq: Two times a day (BID) | INTRAMUSCULAR | Status: DC
  Administered 2021-10-14: 40 mg via INTRAVENOUS
  Filled 2021-10-14: qty 4

## 2021-10-14 MED ORDER — SODIUM CHLORIDE 0.9 % IV SOLN: 1.0000 g | INTRAVENOUS | Status: DC

## 2021-10-14 MED ORDER — SODIUM CHLORIDE 0.9 % IV SOLN: 500.0000 mg | INTRAVENOUS | Status: DC

## 2021-10-14 MED ORDER — ENOXAPARIN SODIUM 60 MG/0.6ML IJ SOSY
0.5000 mg/kg | PREFILLED_SYRINGE | INTRAMUSCULAR | Status: DC
  Administered 2021-10-14 – 2021-10-16 (×3): 62.5 mg via SUBCUTANEOUS
  Filled 2021-10-14 (×3): qty 1.2

## 2021-10-14 NOTE — Evaluation (Signed)
Occupational Therapy Evaluation Patient Details Name: Jaime Fitzgerald MRN: [id number] DOB: [birth year] Today's Date: 10/14/2021   History of Present Illness 61 y.o. female came to ED with complaints of shortness of breath that has been going on for the past 2 to 3 weeks,reports that it has been progressively getting worse and initially started with a cold upper respiratory kind of congestion symptoms.  Also c/o orthopenia, hypoxic and placed on O2.   Clinical Impression   Patient presenting with decreased Ind in self care, balance, functional mobility/transfers, endurance, and safety awareness. Patient reports living at home alone with an aide to assist with IADL and some ADL tasks. PT does not use AD at baseline and endorses furniture cruising. Patient currently functioning at min - mod A during session with HR ranging from 100-140 bpm with task. Pt is on 3 Ls O2 during session and an attempt to wean down to 2LS but pt dropped to 88% with sitting EOB and placed back on 3Ls.  Patient will benefit from acute OT to increase overall independence in the areas of ADLs, functional mobility, and safety awareness in order to safely discharge to next venue of care.      Recommendations for follow up therapy are one component of a multi-disciplinary discharge planning process, led by the attending physician.  Recommendations may be updated based on patient status, additional functional criteria and insurance authorization.   Follow Up Recommendations  Skilled nursing-short term rehab (<3 hours/day)    Assistance Recommended at Discharge Frequent or constant Supervision/Assistance  Patient can return home with the following A little help with walking and/or transfers;A little help with bathing/dressing/bathroom    Functional Status Assessment  Patient has had a recent decline in their functional status and demonstrates the ability to make significant improvements in function in a reasonable and predictable  amount of time.  Equipment Recommendations  Other (comment) (defer to next venue of care)       Precautions / Restrictions Precautions Precautions: Fall Restrictions Weight Bearing Restrictions: No      Mobility Bed Mobility Overal bed mobility: Needs Assistance Bed Mobility: Supine to Sit;Sit to Supine     Supine to sit: Min assist Sit to supine: Min assist   General bed mobility comments: assistance with trunk support to EOB    Transfers Overall transfer level: Modified independent Equipment used: Rolling walker (2 wheels)               General transfer comment: unable to attempt secondary to dizziness and feeling unwell      Balance Overall balance assessment: Needs assistance Sitting-balance support: Bilateral upper extremity supported Sitting balance-Leahy Scale: Good Sitting balance - Comments: initially very dizzy in sitting but able to maintain balance w/o assist     Standing balance-Leahy Scale: Fair Standing balance comment: reliant on walker, no LOBs.  She did not seem to lean heavily back onto the bed but we did not venture away from bed due to weakness, vitals lability and general confidence                           ADL either performed or assessed with clinical judgement   ADL Overall ADL's : Needs assistance/impaired                                       General ADL Comments: min -  mod A for self care tasks secondary to decreased strength and endurance.     Vision Patient Visual Report: No change from baseline              Pertinent Vitals/Pain Pain Assessment: 0-10 Faces Pain Scale: Hurts little more Pain Location: abdominal distension type pain Pain Descriptors / Indicators: Aching;Discomfort Pain Intervention(s): Limited activity within patient's tolerance;Repositioned     Hand Dominance Right   Extremity/Trunk Assessment Upper Extremity Assessment Upper Extremity Assessment: Generalized weakness    Lower Extremity Assessment Lower Extremity Assessment: Generalized weakness       Communication Communication Communication: No difficulties   Cognition Arousal/Alertness: Awake/alert Behavior During Therapy: WFL for tasks assessed/performed Overall Cognitive Status: Within Functional Limits for tasks assessed                                       General Comments  pt has had non-sustained spikes in HR much of the morning, generally stayed in the 90-110 range t/o PT session but did have multiple episodes of spiking into the 130s and 140s that did not seem directly associated with activity            Home Living Family/patient expects to be discharged to:: Unsure Living Arrangements: Alone Available Help at Discharge: Personal care attendant;Family   Home Access: Level entry     Home Layout: One level     Bathroom Shower/Tub: Tub/shower unit         Home Equipment: Shower seat          Prior Functioning/Environment Prior Level of Function : Independent/Modified Independent             Mobility Comments: Pt reports not leaving home much and furniture walking at baseline ADLs Comments: Pt reports having an aide 7x/wk for a few hours to assist with IADL tasks and assisting pt with getting in/out of shower.        OT Problem List: Decreased strength;Decreased activity tolerance;Impaired balance (sitting and/or standing);Decreased safety awareness;Decreased knowledge of use of DME or AE;Cardiopulmonary status limiting activity      OT Treatment/Interventions: Self-care/ADL training;Therapeutic exercise;Energy conservation;DME and/or AE instruction;Therapeutic activities;Balance training;Patient/family education;Manual therapy    OT Goals(Current goals can be found in the care plan section) Acute Rehab OT Goals Patient Stated Goal: to return home OT Goal Formulation: With patient Time For Goal Achievement: 10/28/21 Potential to Achieve Goals:  Good ADL Goals Pt Will Perform Grooming: with supervision;standing Pt Will Perform Lower Body Dressing: with supervision;sit to/from stand Pt Will Transfer to Toilet: with supervision;ambulating Pt Will Perform Toileting - Clothing Manipulation and hygiene: with supervision;sit to/from stand  OT Frequency: Min 2X/week       AM-PAC OT "6 Clicks" Daily Activity     Outcome Measure Help from another person eating meals?: None Help from another person taking care of personal grooming?: None Help from another person toileting, which includes using toliet, bedpan, or urinal?: A Lot Help from another person bathing (including washing, rinsing, drying)?: A Little Help from another person to put on and taking off regular upper body clothing?: A Little Help from another person to put on and taking off regular lower body clothing?: A Lot 6 Click Score: 18   End of Session Equipment Utilized During Treatment: Oxygen Nurse Communication: Mobility status  Activity Tolerance: Patient limited by fatigue Patient left: in bed;with call bell/phone within reach;with bed alarm set  OT Visit Diagnosis: Unsteadiness on feet (R26.81);Repeated falls (R29.6);Muscle weakness (generalized) (M62.81)                Time: 1250-8719 OT Time Calculation (min): 18 min Charges:  OT General Charges $OT Visit: 1 Visit OT Evaluation $OT Eval Moderate Complexity: 1 Mod OT Treatments $Therapeutic Activity: 8-22 mins  Darleen Crocker, MS, OTR/L , CBIS ascom [phone]  10/14/21, 1:43 PM

## 2021-10-14 NOTE — Progress Notes (Addendum)
PROGRESS NOTE    Jaime Fitzgerald  DJT:[id number] DOB: Jan 11, 1961 DOA: 10/13/2021 PCP: [facility] [place]  133A/133A-AA   Assessment & Plan:   Principal Problem:   SOB (shortness of breath) Active Problems:   Hypertension   AKI (acute kidney injury) ([place])   Type 2 diabetes mellitus (HCC)   Seizures (HCC)   Acute respiratory failure with hypoxia (HCC)   Jaime Fitzgerald is a 61 y.o. female with hx of hypertension, acute blood loss anemia with history of GI bleed, asthma, diabetes mellitus type 2, sleep apnea, obesity who presented with complaints of shortness of breath has been going on for the past 2 to 3 weeks which has been progressively getting worse and initially started with a cold upper respiratory kind of congestion symptoms.   Acute hypoxemic respiratory failure --per ED triage note, "Pt was found to be in 82%RA at office. EMS arrived and pt was on 2L at 96 %." --CTA chest showed Cardiomegaly with increased small moderate pericardial effusion, with no acute airspace disease.  Presentation most likely to be related to CHF.  No evidence of PNA, no wheezing to suggest asthma exacerbation. Plan: --treat CHF as below --Continue supplemental O2 to keep sats >=92%, wean as tolerated  Acute combined CHF exacerbation --unknown if chronic, no prior hx of CHF.  Trop neg. --started on IV lasix 20 mg BID Plan: --cont diuresis as IV lasix 40 mg BID --Strict I/O --monitor Cr while diuresing  AKI, ruled out --Cr 1.15 on presentation.  Baseline Cr is around 1.   Hypertension: No home blood pressure medications.   --on IV lasix for diuresis   Diabetes mellitus type 2: --A1c 7.2. --no need for BG checks or SSI.   DVT prophylaxis: Lovenox SQ Code Status: Full code  Family Communication:  Level of care: Med-Surg Dispo:   The patient is from: home Anticipated d/c is to: PT/OT rec SNF Anticipated d/c date is: 1-2 days Patient currently is not medically ready to  d/c due to: new hypoxia, on IV lasix   Subjective and Interval History:  Pt reported swelling in her abdomen, coughing with clear sputum.  Pt was most concerned about some "cysts" in/around her vagina.     Objective: Vitals:   10/14/21 1353 10/14/21 1530 10/14/21 1544 10/14/21 1714  BP: (!) 152/80 133/77 133/77 135/87  Pulse: 98 95 95 98  Resp: (!) 27 17 17 16   Temp: 98.4 F (36.9 C) 98.5 F (36.9 C) 98.5 F (36.9 C) (!) 97.4 F (36.3 C)  TempSrc: Oral  Oral   SpO2: 94% 99%  100%  Weight:   122.7 kg   Height:   5\' 3"  (1.6 m)     Intake/Output Summary (Last 24 hours) at 10/14/2021 1939 Last data filed at 10/13/2021 2314 Gross per 24 hour  Intake 250 ml  Output 800 ml  Net -550 ml   Filed Weights   10/14/21 1544  Weight: 122.7 kg    Examination:   Constitutional: NAD, AAOx3 HEENT: conjunctivae and lids normal, EOMI CV: No cyanosis.   RESP: normal respiratory effort, no wheezes Extremities: No effusions, edema in BLE SKIN: warm, dry Neuro: II - XII grossly intact.   Psych: Normal mood and affect.  Appropriate judgement and reason   Data Reviewed: I have personally reviewed following labs and imaging studies  CBC: Recent Labs  Lab 10/13/21 1013 10/14/21 0631  WBC 6.9 5.6  NEUTROABS  --  3.2  HGB 14.2 14.2  HCT  44.3 44.5  MCV 107.3* 108.0*  PLT 216 354   Basic Metabolic Panel: Recent Labs  Lab 10/13/21 1013 10/14/21 0631  NA 139 140  K 3.8 4.2  CL 95* 96*  CO2 35* 36*  GLUCOSE 127* 148*  BUN 22 16  CREATININE 1.15* 0.99  CALCIUM 8.6* 8.3*   GFR: Estimated Creatinine Clearance: 74.9 mL/min (by C-G formula based on SCr of 0.99 mg/dL). Liver Function Tests: Recent Labs  Lab 10/13/21 1013  AST 18  ALT 11  ALKPHOS 59  BILITOT 0.5  PROT 7.3  ALBUMIN 3.9   No results for input(s): LIPASE, AMYLASE in the last 168 hours. No results for input(s): AMMONIA in the last 168 hours. Coagulation Profile: No results for input(s): INR, PROTIME in the  last 168 hours. Cardiac Enzymes: No results for input(s): CKTOTAL, CKMB, CKMBINDEX, TROPONINI in the last 168 hours. BNP (last 3 results) No results for input(s): PROBNP in the last 8760 hours. HbA1C: Recent Labs    10/14/21 0631  HGBA1C 7.2*   CBG: No results for input(s): GLUCAP in the last 168 hours. Lipid Profile: No results for input(s): CHOL, HDL, LDLCALC, TRIG, CHOLHDL, LDLDIRECT in the last 72 hours. Thyroid Function Tests: Recent Labs    10/14/21 0631  TSH 1.955  FREET4 0.89   Anemia Panel: No results for input(s): VITAMINB12, FOLATE, FERRITIN, TIBC, IRON, RETICCTPCT in the last 72 hours. Sepsis Labs: Recent Labs  Lab 10/13/21 1013 10/13/21 1531 10/14/21 0631  PROCALCITON <0.10  --   --   LATICACIDVEN  --  1.6 1.1    Recent Results (from the past 240 hour(s))  Blood culture (routine x 2)     Status: None (Preliminary result)   Collection Time: 10/13/21  3:31 PM   Specimen: BLOOD  Result Value Ref Range Status   Specimen Description BLOOD RIGHT ANTECUBITAL  Final   Special Requests   Final    BOTTLES DRAWN AEROBIC AND ANAEROBIC Blood Culture adequate volume   Culture   Final    NO GROWTH < 24 HOURS Performed at [facility], [address], [place], [place] [address]    Report Status PENDING  Incomplete  Blood culture (routine x 2)     Status: None (Preliminary result)   Collection Time: 10/13/21  3:31 PM   Specimen: BLOOD  Result Value Ref Range Status   Specimen Description BLOOD LEFT ANTECUBITAL  Final   Special Requests   Final    BOTTLES DRAWN AEROBIC AND ANAEROBIC Blood Culture adequate volume   Culture   Final    NO GROWTH < 24 HOURS Performed at [facility], [address], [place], [place] [address]    Report Status PENDING  Incomplete  Resp Panel by RT-PCR (Flu A&B, Covid) Nasopharyngeal Swab     Status: None   Collection Time: 10/13/21  4:31 PM   Specimen: Nasopharyngeal Swab; Nasopharyngeal(NP) swabs in vial  transport medium  Result Value Ref Range Status   SARS Coronavirus 2 by RT PCR NEGATIVE NEGATIVE Final    Comment: (NOTE) SARS-CoV-2 target nucleic acids are NOT DETECTED.  The SARS-CoV-2 RNA is generally detectable in upper respiratory specimens during the acute phase of infection. The lowest concentration of SARS-CoV-2 viral copies this assay can detect is 138 copies/mL. A negative result does not preclude SARS-Cov-2 infection and should not be used as the sole basis for treatment or other patient management decisions. A negative result may occur with  improper specimen collection/handling, submission of specimen  other than nasopharyngeal swab, presence of viral mutation(s) within the areas targeted by this assay, and inadequate number of viral copies(<138 copies/mL). A negative result must be combined with clinical observations, patient history, and epidemiological information. The expected result is Negative.  Fact Sheet for Patients:  EntrepreneurPulse.com.au  Fact Sheet for Healthcare Providers:  IncredibleEmployment.be  This test is no t yet approved or cleared by the [place] FDA and  has been authorized for detection and/or diagnosis of SARS-CoV-2 by FDA under an Emergency Use Authorization (EUA). This EUA will remain  in effect (meaning this test can be used) for the duration of the COVID-19 declaration under Section 564(b)(1) of the Act, 21 U.S.C.section 360bbb-3(b)(1), unless the authorization is terminated  or revoked sooner.       Influenza A by PCR NEGATIVE NEGATIVE Final   Influenza B by PCR NEGATIVE NEGATIVE Final    Comment: (NOTE) The Xpert Xpress SARS-CoV-2/FLU/RSV plus assay is intended as an aid in the diagnosis of influenza from Nasopharyngeal swab specimens and should not be used as a sole basis for treatment. Nasal washings and aspirates are unacceptable for Xpert Xpress SARS-CoV-2/FLU/RSV testing.  Fact  Sheet for Patients: EntrepreneurPulse.com.au  Fact Sheet for Healthcare Providers: IncredibleEmployment.be  This test is not yet approved or cleared by the [place] FDA and has been authorized for detection and/or diagnosis of SARS-CoV-2 by FDA under an Emergency Use Authorization (EUA). This EUA will remain in effect (meaning this test can be used) for the duration of the COVID-19 declaration under Section 564(b)(1) of the Act, 21 U.S.C. section 360bbb-3(b)(1), unless the authorization is terminated or revoked.  Performed at [facility], [address], [place], [place] [address]       Radiology Studies: DG Chest 2 View  Result Date: 10/13/2021 CLINICAL DATA:  Shortness of breath EXAM: CHEST - 2 VIEW COMPARISON:  06/13/2021 FINDINGS: Enlarged cardiac silhouette is again identified, which appears to be primarily due to prominent fat. Patchy density at the right lung base. No pleural effusion. No acute osseous abnormality. IMPRESSION: Patchy atelectasis/consolidation at the right lung base. Electronically Signed   By: Macy Mis M.D.   On: 10/13/2021 10:10   CT Angio Chest PE W and/or Wo Contrast  Result Date: 10/13/2021 CLINICAL DATA:  Hypoxic EXAM: CT ANGIOGRAPHY CHEST WITH CONTRAST TECHNIQUE: Multidetector CT imaging of the chest was performed using the standard protocol during bolus administration of intravenous contrast. Multiplanar CT image reconstructions and MIPs were obtained to evaluate the vascular anatomy. CONTRAST:  158mL OMNIPAQUE IOHEXOL 350 MG/ML SOLN COMPARISON:  Chest x-ray 10/13/2021, CT chest 06/13/2021 FINDINGS: Cardiovascular: Satisfactory opacification of the pulmonary arteries to the segmental level. No evidence of pulmonary embolism. Nonaneurysmal aorta. Mild atherosclerosis. No dissection. Cardiomegaly. Increased pericardial effusion, small moderate measuring 15 mm maximum thickness left cardiac margin.  Mediastinum/Nodes: Midline trachea. No thyroid mass. Enlarged right paratracheal lymph nodes measuring up to 17 mm without significant change. Small right hilar lymph nodes. Esophagus normal Lungs/Pleura: Mild subpleural reticulation and emphysema. No consolidation, pleural effusion or pneumothorax Upper Abdomen: No acute abnormality. Musculoskeletal: No chest wall abnormality. No acute or significant osseous findings. Review of the MIP images confirms the above findings. IMPRESSION: 1. Negative for acute pulmonary embolus or aortic dissection 2. Cardiomegaly with increased small moderate pericardial effusion 3. Mild subpleural reticulation consistent with chronic lung disease. No acute airspace disease 4. Similar mild mediastinal adenopathy Aortic Atherosclerosis (ICD10-I70.0) and Emphysema (ICD10-J43.9). Electronically Signed   By: Madie Reno.D.  On: 10/13/2021 19:49   ECHOCARDIOGRAM COMPLETE  Result Date: 10/14/2021    ECHOCARDIOGRAM REPORT   Patient Name:   SHILPA BUSHEE Date of Exam: 10/14/2021 Medical Rec #:  [id number]      Height:       63.0 in Accession #:    [id number]     Weight:       270.0 lb Date of Birth:  Feb 10, 1961      BSA:          2.197 m Patient Age:    37 years       BP:           165/93 mmHg Patient Gender: F              HR:           90 bpm. Exam Location:  [facility] Procedure: 2D Echo, Color Doppler and Cardiac Doppler Indications:     I50.21 congestive heart failure-Acute Systolic  History:         Patient has no prior history of Echocardiogram examinations.                  Signs/Symptoms:Shortness of Breath, Chest Pain, Edema and Back                  pain; Risk Factors:Hypertension, Diabetes and Sleep Apnea.  Sonographer:     Charmayne Sheer Referring Phys:  [place] Diagnosing Phys: Neoma Laming  Sonographer Comments: Suboptimal subcostal window. IMPRESSIONS  1. Left ventricular ejection fraction, by estimation, is 45 to 50%. The left ventricle has mildly decreased function.  The left ventricle has no regional wall motion abnormalities. Left ventricular diastolic parameters are consistent with Grade III diastolic dysfunction (restrictive).  2. Right ventricular systolic function is moderately reduced. The right ventricular size is moderately enlarged.  3. Left atrial size was mildly dilated.  4. Right atrial size was mildly dilated.  5. The mitral valve is normal in structure. Trivial mitral valve regurgitation. No evidence of mitral stenosis.  6. The aortic valve is normal in structure. Aortic valve regurgitation is not visualized. No aortic stenosis is present.  7. The inferior vena cava is normal in size with greater than 50% respiratory variability, suggesting right atrial pressure of 3 mmHg. FINDINGS  Left Ventricle: Left ventricular ejection fraction, by estimation, is 45 to 50%. The left ventricle has mildly decreased function. The left ventricle has no regional wall motion abnormalities. The left ventricular internal cavity size was normal in size. There is no left ventricular hypertrophy. Left ventricular diastolic parameters are consistent with Grade III diastolic dysfunction (restrictive). Right Ventricle: The right ventricular size is moderately enlarged. No increase in right ventricular wall thickness. Right ventricular systolic function is moderately reduced. Left Atrium: Left atrial size was mildly dilated. Right Atrium: Right atrial size was mildly dilated. Pericardium: There is no evidence of pericardial effusion. Mitral Valve: The mitral valve is normal in structure. Trivial mitral valve regurgitation. No evidence of mitral valve stenosis. MV peak gradient, 4.1 mmHg. The mean mitral valve gradient is 2.0 mmHg. Tricuspid Valve: The tricuspid valve is normal in structure. Tricuspid valve regurgitation is trivial. No evidence of tricuspid stenosis. Aortic Valve: The aortic valve is normal in structure. Aortic valve regurgitation is not visualized. No aortic stenosis is  present. Aortic valve mean gradient measures 6.0 mmHg. Aortic valve peak gradient measures 11.2 mmHg. Aortic valve area, by VTI measures 2.15 cm. Pulmonic Valve: The pulmonic valve was normal in  structure. Pulmonic valve regurgitation is not visualized. No evidence of pulmonic stenosis. Aorta: The aortic root is normal in size and structure. Venous: The inferior vena cava is normal in size with greater than 50% respiratory variability, suggesting right atrial pressure of 3 mmHg. IAS/Shunts: No atrial level shunt detected by color flow Doppler.  LEFT VENTRICLE PLAX 2D LVIDd:         4.61 cm   Diastology LVIDs:         3.55 cm   LV e' medial:    5.55 cm/s LV PW:         0.98 cm   LV E/e' medial:  14.1 LV IVS:        0.82 cm   LV e' lateral:   6.42 cm/s LVOT diam:     2.00 cm   LV E/e' lateral: 12.1 LV SV:         62 LV SV Index:   28 LVOT Area:     3.14 cm  RIGHT VENTRICLE RV Basal diam:  3.48 cm LEFT ATRIUM             Index        RIGHT ATRIUM           Index LA diam:        3.40 cm 1.55 cm/m   RA Area:     10.10 cm LA Vol (A2C):   42.1 ml 19.17 ml/m  RA Volume:   17.00 ml  7.74 ml/m LA Vol (A4C):   31.9 ml 14.52 ml/m LA Biplane Vol: 37.4 ml 17.03 ml/m  AORTIC VALVE                     PULMONIC VALVE AV Area (Vmax):    2.16 cm      PV Vmax:       1.11 m/s AV Area (Vmean):   2.07 cm      PV Vmean:      76.300 cm/s AV Area (VTI):     2.15 cm      PV VTI:        0.188 m AV Vmax:           167.00 cm/s   PV Peak grad:  4.9 mmHg AV Vmean:          118.000 cm/s  PV Mean grad:  3.0 mmHg AV VTI:            0.288 m AV Peak Grad:      11.2 mmHg AV Mean Grad:      6.0 mmHg LVOT Vmax:         115.00 cm/s LVOT Vmean:        77.600 cm/s LVOT VTI:          0.197 m LVOT/AV VTI ratio: 0.68  AORTA Ao Root diam: 2.50 cm MITRAL VALVE MV Area (PHT): 4.60 cm     SHUNTS MV Area VTI:   3.19 cm     Systemic VTI:  0.20 m MV Peak grad:  4.1 mmHg     Systemic Diam: 2.00 cm MV Mean grad:  2.0 mmHg MV Vmax:       1.01 m/s MV Vmean:       67.1 cm/s MV Decel Time: 165 msec MV E velocity: 78.00 cm/s MV A velocity: 100.00 cm/s MV E/A ratio:  0.78 Shaukat Khan Electronically signed by Neoma Laming Signature Date/Time: 10/14/2021/11:41:37 AM    Final  Scheduled Meds:  furosemide  40 mg Intravenous BID   heparin  5,000 Units Subcutaneous Q8H   pantoprazole (PROTONIX) IV  40 mg Intravenous Q24H   sodium chloride flush  3 mL Intravenous Q12H   sucralfate  1 g Oral TID WC & HS   Continuous Infusions:   LOS: 1 day     Enzo Bi, MD [facility] If 7PM-7AM, please contact night-coverage 10/14/2021, 7:39 PM

## 2021-10-14 NOTE — Progress Notes (Signed)
*  PRELIMINARY RESULTS* Echocardiogram 2D Echocardiogram has been performed.  Jaime Fitzgerald 10/14/2021, 9:34 AM

## 2021-10-14 NOTE — Evaluation (Addendum)
Physical Therapy Evaluation Patient Details Name: Jaime Fitzgerald MRN: [id number] DOB: 1961/06/09 Today's Date: 10/14/2021  History of Present Illness  61 y.o. female came to ED with complaints of shortness of breath that has been going on for the past 2 to 3 weeks,reports that it has been progressively getting worse and initially started with a cold upper respiratory kind of congestion symptoms.  Also c/o orthopenia, hypoxic and placed on O2.  Clinical Impression  Pt pleasant and eager to see what she could do but had issues with vitals t/o the session.  Her HR generally stayed in the 90-100 range with and without activity, however she had inconsistent spikes into the 140s and had symptomatic hypotension associated dizziness with transition supine to sit (BP 78/62.)  Pt lives alone with aide a few hours 2d/wk but is currently too weak and functionally limited to safely go home at this point.  Recommending STR, however pt hopeful to improve and be able to return home.     Recommendations for follow up therapy are one component of a multi-disciplinary discharge planning process, led by the attending physician.  Recommendations may be updated based on patient status, additional functional criteria and insurance authorization.  Follow Up Recommendations Skilled nursing-short term rehab (<3 hours/day)    Assistance Recommended at Discharge Frequent or constant Supervision/Assistance  Patient can return home with the following  A little help with bathing/dressing/bathroom;A little help with walking and/or transfers    Equipment Recommendations Rolling walker (2 wheels)  Recommendations for Other Services       Functional Status Assessment Patient has had a recent decline in their functional status and demonstrates the ability to make significant improvements in function in a reasonable and predictable amount of time.     Precautions / Restrictions Precautions Precautions:  Fall Restrictions Weight Bearing Restrictions: No      Mobility  Bed Mobility Overal bed mobility: Modified Independent             General bed mobility comments: Pt with significant dizziness with getting supine to sit, BP was 78/62, after ~3 minutes and subsidance of symptoms    Transfers Overall transfer level: Modified independent Equipment used: Rolling walker (2 wheels)               General transfer comment: Pt's height to gurney height ratio essentially got her to standing just by getting feet to the floor, pt eager to stand but did not tolerate for very long - though she did not have symptomatic dizziness with transition to standing    Ambulation/Gait               General Gait Details: no true ambulation, she was able to do some guarded side shuffling ~3 ft along EOB with heavy walker reliance and plenty of cuing.  Pt fatigued with the effort despite being very motivated to do as much as she could.  Stairs            Wheelchair Mobility    Modified Rankin (Stroke Patients Only)       Balance Overall balance assessment: Needs assistance Sitting-balance support: Bilateral upper extremity supported Sitting balance-Leahy Scale: Good Sitting balance - Comments: initially very dizzy in sitting but able to maintain balance w/o assist     Standing balance-Leahy Scale: Fair Standing balance comment: reliant on walker, no LOBs.  She did not seem to lean heavily back onto the bed but we did not venture away from bed due to weakness, vitals  lability and general confidence                             Pertinent Vitals/Pain Pain Assessment: Faces Faces Pain Scale: Hurts little more Pain Location: abdominal distension type pain    Home Living Family/patient expects to be discharged to:: Unsure Living Arrangements: Alone Available Help at Discharge: Personal care attendant;Family (PCA 2-3 hrs 2d/wk)   Home Access: Level entry        Home Layout: One level Home Equipment:  (reports she has a bed that can raise (not hospital bed) and ability to sit in the shower but no other equipment)      Prior Function Prior Level of Function : Independent/Modified Independent             Mobility Comments: Pt reports that she is rarely out of the home but can manage in the home, endorses using walls/funiture a lot with no falls       Hand Dominance        Extremity/Trunk Assessment   Upper Extremity Assessment Upper Extremity Assessment: Generalized weakness    Lower Extremity Assessment Lower Extremity Assessment: Generalized weakness       Communication   Communication: No difficulties  Cognition Arousal/Alertness: Awake/alert Behavior During Therapy: WFL for tasks assessed/performed Overall Cognitive Status: Within Functional Limits for tasks assessed                                          General Comments General comments (skin integrity, edema, etc.): pt has had non-sustained spikes in HR much of the morning, generally stayed in the 90-110 range t/o PT session but did have multiple episodes of spiking into the 130s and 140s that did not seem directly associated with activity.  Pt's O2 remained in the low 90s t/o most of session while on 2L.    Exercises     Assessment/Plan    PT Assessment Patient needs continued PT services  PT Problem List Decreased strength;Decreased balance;Decreased activity tolerance;Decreased mobility;Decreased safety awareness;Decreased knowledge of use of DME;Cardiopulmonary status limiting activity       PT Treatment Interventions DME instruction;Gait training;Functional mobility training;Therapeutic activities;Therapeutic exercise;Balance training;Patient/family education    PT Goals (Current goals can be found in the Care Plan section)  Acute Rehab PT Goals PT Goal Formulation: With patient Time For Goal Achievement: 10/28/21 Potential to Achieve  Goals: Good    Frequency Min 2X/week     Co-evaluation               AM-PAC PT "6 Clicks" Mobility  Outcome Measure Help needed turning from your back to your side while in a flat bed without using bedrails?: None Help needed moving from lying on your back to sitting on the side of a flat bed without using bedrails?: A Little Help needed moving to and from a bed to a chair (including a wheelchair)?: A Little Help needed standing up from a chair using your arms (e.g., wheelchair or bedside chair)?: A Little Help needed to walk in hospital room?: A Lot Help needed climbing 3-5 steps with a railing? : A Lot 6 Click Score: 17    End of Session Equipment Utilized During Treatment: Gait belt Activity Tolerance: Patient limited by fatigue Patient left: in bed;with call bell/phone within reach Nurse Communication: Mobility status (HR and BP during  session) PT Visit Diagnosis: Muscle weakness (generalized) (M62.81);Difficulty in walking, not elsewhere classified (R26.2)    Time: 5809-9833 PT Time Calculation (min) (ACUTE ONLY): 33 min   Charges:   PT Evaluation $PT Eval Low Complexity: 1 Low PT Treatments $Therapeutic Activity: 8-22 mins        Kreg Shropshire, DPT 10/14/2021, 10:45 AM

## 2021-10-14 NOTE — Progress Notes (Signed)
Nutrition Brief Note  Patient identified on the Malnutrition Screening Tool (MST) Report  Wt Readings from Last 15 Encounters:  06/13/21 122.5 kg  09/17/20 86.2 kg  01/17/20 81.6 kg  10/13/19 86.6 kg  04/18/19 81.6 kg  07/19/17 81.6 kg  03/22/17 81.6 kg  01/07/17 81.6 kg  11/03/16 83.9 kg  12/24/15 83 kg   Jaime Fitzgerald is a 61 y.o. female seen in ed with complaints of presents with complaints of shortness of breath has been going on for the past 2 to 3 weeks patient reports that it has been progressively getting worse and initially started with a cold upper respiratory kind of congestion symptoms.  Patient is wanting to stay on exertion patient also reported orthopnea.  She went to PCP for her back pain.  Has been going on for about 2 to 3 weeks was noted to be hypoxic and was placed on oxygen.  Review of systems is negative for headaches, speech or gait chest pain palpitations abdominal pain fevers and chills.  Pt admitted with AKI.   Current diet order is NPO, patient is consuming approximately n/a% of meals at this time. Labs and medications reviewed.   No nutrition interventions warranted at this time. If nutrition issues arise, please consult RD.   Loistine Chance, RD, LDN, [place] Registered Dietitian II Certified Diabetes Care and Education Specialist Please refer to [facility] for RD and/or RD on-call/weekend/after hours pager

## 2021-10-14 NOTE — Plan of Care (Signed)
  Problem: Cardiac: Goal: Ability to achieve and maintain adequate cardiopulmonary perfusion will improve Outcome: Progressing   

## 2021-10-15 LAB — BASIC METABOLIC PANEL
Anion gap: 7 (ref 5–15)
BUN: 19 mg/dL (ref 8–23)
CO2: 39 mmol/L — ABNORMAL HIGH (ref 22–32)
Calcium: 8.5 mg/dL — ABNORMAL LOW (ref 8.9–10.3)
Chloride: 91 mmol/L — ABNORMAL LOW (ref 98–111)
Creatinine, Ser: 1.13 mg/dL — ABNORMAL HIGH (ref 0.44–1.00)
GFR, Estimated: 55 mL/min — ABNORMAL LOW (ref >60)
Glucose, Bld: 133 mg/dL — ABNORMAL HIGH (ref 70–99)
Potassium: 4 mmol/L (ref 3.5–5.1)
Sodium: 137 mmol/L (ref 135–145)

## 2021-10-15 LAB — MAGNESIUM: Magnesium: 1.9 mg/dL (ref 1.7–2.4)

## 2021-10-15 LAB — CBC
HCT: 44.6 % (ref 36.0–46.0)
Hemoglobin: 14.1 g/dL (ref 12.0–15.0)
MCH: 34.1 pg — ABNORMAL HIGH (ref 26.0–34.0)
MCHC: 31.6 g/dL (ref 30.0–36.0)
MCV: 107.7 fL — ABNORMAL HIGH (ref 80.0–100.0)
Platelets: 222 10*3/uL (ref 150–400)
RBC: 4.14 MIL/uL (ref 3.87–5.11)
RDW: 13.7 % (ref 11.5–15.5)
WBC: 6.3 10*3/uL (ref 4.0–10.5)
nRBC: 0 % (ref 0.0–0.2)

## 2021-10-15 LAB — GLUCOSE, CAPILLARY
Glucose-Capillary: 149 mg/dL — ABNORMAL HIGH (ref 70–99)
Glucose-Capillary: 148 mg/dL — ABNORMAL HIGH (ref 70–99)

## 2021-10-15 LAB — TROPONIN I (HIGH SENSITIVITY): Troponin I (High Sensitivity): 9 ng/L (ref <18)

## 2021-10-15 MED ORDER — POLYETHYLENE GLYCOL 3350 17 G PO PACK
17.0000 g | PACK | Freq: Every day | ORAL | Status: DC
  Administered 2021-10-15 – 2021-10-17 (×3): 17 g via ORAL
  Filled 2021-10-15 (×3): qty 1

## 2021-10-15 MED ORDER — FUROSEMIDE 10 MG/ML IJ SOLN
40.0000 mg | Freq: Every day | INTRAMUSCULAR | Status: DC
  Administered 2021-10-15 – 2021-10-16 (×2): 40 mg via INTRAVENOUS
  Filled 2021-10-15 (×2): qty 4

## 2021-10-15 MED ORDER — ALUM & MAG HYDROXIDE-SIMETH 200-200-20 MG/5ML PO SUSP
30.0000 mL | ORAL | Status: DC | PRN
  Administered 2021-10-15 (×2): 30 mL via ORAL
  Filled 2021-10-15 (×2): qty 30

## 2021-10-15 MED ORDER — SENNOSIDES-DOCUSATE SODIUM 8.6-50 MG PO TABS
1.0000 | ORAL_TABLET | Freq: Every evening | ORAL | Status: DC | PRN
  Administered 2021-10-15 – 2021-10-16 (×2): 1 via ORAL
  Filled 2021-10-15 (×2): qty 1

## 2021-10-15 MED ORDER — WITCH HAZEL-GLYCERIN EX PADS
MEDICATED_PAD | CUTANEOUS | Status: DC | PRN
  Filled 2021-10-15: qty 100

## 2021-10-15 NOTE — Progress Notes (Signed)
Brief Nutrition Follow-Up Note  RD consulted for assessment for obesity.   Obesity is a complex, chronic medical condition that is optimally managed by a multidisciplinary care team. Weight loss is not an ideal goal for an acute inpatient hospitalization. However, if further work-up for obesity is warranted, consider outpatient referral to outpatient bariatric service and/or [facility].    If further nutrition-related needs are identified, please re- consult RD.   Loistine Chance, RD, LDN, [place] Registered Dietitian II Certified Diabetes Care and Education Specialist Please refer to [facility] for RD and/or RD on-call/weekend/after hours pager

## 2021-10-15 NOTE — Progress Notes (Signed)
Physical Therapy Treatment Patient Details Name: Jaime Fitzgerald MRN: [id number] DOB: 1961/08/15 Today's Date: 10/15/2021   History of Present Illness 61 y.o. female came to ED with complaints of shortness of breath that has been going on for the past 2 to 3 weeks,reports that it has been progressively getting worse and initially started with a cold upper respiratory kind of congestion symptoms.  Also c/o orthopenia, hypoxic and placed on O2.    PT Comments    Pt eager to work with PT and motivated to see how much she could walk.  She showed good effort but overall showed very limited activity tolerance and is far from her baseline.  Pt does not typically need walker and reports being able to regularly walk her dog for >30 minutes without issue.  Pt with hesitant, unsteady, AD reliant gait and was quick to fatigue (unable to ambulate >25 ft) despite being highly motivated to show her ability to safely manage at home. Pt reports she has O2 at home but hardly uses it, today she was unable to maintain sats in the 90s with ~1 minute on room air sitting EOB, needed 2L O2 to maintain 90s t/o the session.  Pt eager to go home but w/o 24/7 assist would likely struggle and be unsafe, still recommending STR.    Recommendations for follow up therapy are one component of a multi-disciplinary discharge planning process, led by the attending physician.  Recommendations may be updated based on patient status, additional functional criteria and insurance authorization.  Follow Up Recommendations  Skilled nursing-short term rehab (<3 hours/day)     Assistance Recommended at Discharge Frequent or constant Supervision/Assistance  Patient can return home with the following A little help with bathing/dressing/bathroom;A little help with walking and/or transfers   Equipment Recommendations       Recommendations for Other Services       Precautions / Restrictions Precautions Precautions:  Fall Restrictions Weight Bearing Restrictions: No     Mobility  Bed Mobility Overal bed mobility: Needs Assistance Bed Mobility: Supine to Sit     Supine to sit: Min guard     General bed mobility comments: Pt needed UE use on rails but was able to transition to EOB w/o assist (HOB elevated)    Transfers Overall transfer level: Modified independent Equipment used: Rolling walker (2 wheels)               General transfer comment: Pt motivated to get up and do some walking.  She was able to rise w/o assist on 2 seperate occasions but showed general unsteadiness and reliance on UEs that is not her baseline    Ambulation/Gait Ambulation/Gait assistance: Min assist Gait Distance (Feet): 25 Feet Assistive device: Rolling walker (2 wheels)         General Gait Details: 20 ft then 25 ft with slow, hesitant, AD reliant cadence.  She had no overt LOBs but consistent small scale stagger stepping and quick to fatigue with relatively abrupt need to sit/rest shortly after voicing desite to push herself and do prolonged ambulation.   Stairs             Wheelchair Mobility    Modified Rankin (Stroke Patients Only)       Balance Overall balance assessment: Needs assistance Sitting-balance support: Bilateral upper extremity supported Sitting balance-Leahy Scale: Good     Standing balance support: Bilateral upper extremity supported Standing balance-Leahy Scale: Fair Standing balance comment: Very reliant on walker, unsteadiness with no overt  LOBs.                            Cognition Arousal/Alertness: Awake/alert Behavior During Therapy: WFL for tasks assessed/performed Overall Cognitive Status: Within Functional Limits for tasks assessed                                          Exercises General Exercises - Lower Extremity Long Arc Quad: Strengthening;10 reps Heel Slides: Strengthening;10 reps Hip ABduction/ADduction:  Strengthening;5 reps Hip Flexion/Marching: Strengthening;5 reps    General Comments General comments (skin integrity, edema, etc.): Pt continues to state strong desire to go home but does understand that she is much more limited than her baseline.  On 2 L on arrival with sats in the high 90s, attempted light bed activity on room air and SpO2 quickly dropped to the mid 80s.  Returned slowly back to 90s on 2L and maintained 90s during activity on 2L.       Pertinent Vitals/Pain Pain Assessment: 0-10 Pain Score: 4  Pain Location: abdominal distension type pain    Home Living                          Prior Function            PT Goals (current goals can now be found in the care plan section) Progress towards PT goals: Progressing toward goals    Frequency    Min 2X/week      PT Plan Current plan remains appropriate    Co-evaluation              AM-PAC PT "6 Clicks" Mobility   Outcome Measure  Help needed turning from your back to your side while in a flat bed without using bedrails?: None Help needed moving from lying on your back to sitting on the side of a flat bed without using bedrails?: A Little Help needed moving to and from a bed to a chair (including a wheelchair)?: A Little Help needed standing up from a chair using your arms (e.g., wheelchair or bedside chair)?: A Little Help needed to walk in hospital room?: A Lot Help needed climbing 3-5 steps with a railing? : A Lot 6 Click Score: 17    End of Session Equipment Utilized During Treatment: Gait belt Activity Tolerance: Patient limited by fatigue Patient left: in chair;with nursing/sitter in room;with call bell/phone within reach Nurse Communication: Mobility status PT Visit Diagnosis: Muscle weakness (generalized) (M62.81);Difficulty in walking, not elsewhere classified (R26.2)     Time: 6712-4580 PT Time Calculation (min) (ACUTE ONLY): 34 min  Charges:  $Gait Training: 8-22  mins $Therapeutic Exercise: 8-22 mins                      Kreg Shropshire, DPT 10/15/2021, 5:59 PM

## 2021-10-15 NOTE — Consult Note (Signed)
Consult History and Physical   SERVICE: Gynecology   Patient Name: Jaime Fitzgerald  Patient MRN:   [id number]   CC: Labia pain  HPI: Jaime Fitzgerald  is a 61 y.o. No obstetric history on file. presenting with bilateral labial pain.   Review of Systems: positives in bold GEN:   fevers, chills HEENT:  HA, vision changes, hearing loss, congestion, rhinorrhea, sinus pressure, dysphagia CV:   CP, palpitations PULM:  SOB, cough GI:  abd pain, N/V/D/C GU:  dysuria, urgency, frequency MSK:  arthralgias, myalgias, back pain, swelling SKIN:  rashes, color changes, pallor NEURO:  numbness, weakness, tingling, seizures, dizziness, tremors PSYCH:  depression, anxiety, behavioral problems, confusion  HEME/LYMPH:  easy bruising or bleeding ENDO:  heat/cold intolerance  Past Obstetrical History: OB History   No obstetric history on file.      Past Gynecologic History: No LMP recorded. Patient is postmenopausal.   Past Medical History: Past Medical History:  Diagnosis Date   Acute blood loss anemia 10/11/2019   Acute GI bleeding 10/11/2019   Anginal pain ([place])    Arthritis    Asthma    Diabetes mellitus without complication ([place])    Dyspnea    Hypertension    Seizures ([place])    Sleep apnea     Past Surgical History: Past Surgical History:  Procedure Laterality Date   CARPAL TUNNEL RELEASE     COLONOSCOPY WITH PROPOFOL N/A 01/17/2020   Procedure: COLONOSCOPY WITH PROPOFOL;  Surgeon: Lin Landsman, MD;  Location: [facility] ENDOSCOPY;  Service: Gastroenterology;  Laterality: N/A;   ESOPHAGOGASTRODUODENOSCOPY N/A 10/13/2019   Procedure: ESOPHAGOGASTRODUODENOSCOPY (EGD);  Surgeon: Virgel Manifold, MD;  Location: [facility] ENDOSCOPY;  Service: Endoscopy;  Laterality: N/A;   ESOPHAGOGASTRODUODENOSCOPY N/A 09/18/2020   Procedure: ESOPHAGOGASTRODUODENOSCOPY (EGD);  Surgeon: [place], Benay Pike, MD;  Location: [facility] ENDOSCOPY;  Service: Gastroenterology;  Laterality: N/A;    ESOPHAGOGASTRODUODENOSCOPY (EGD) WITH PROPOFOL N/A 10/11/2019   Procedure: ESOPHAGOGASTRODUODENOSCOPY (EGD) WITH PROPOFOL;  Surgeon: Virgel Manifold, MD;  Location: [facility] ENDOSCOPY;  Service: Endoscopy;  Laterality: N/A;   ESOPHAGOGASTRODUODENOSCOPY (EGD) WITH PROPOFOL N/A 01/17/2020   Procedure: ESOPHAGOGASTRODUODENOSCOPY (EGD) WITH PROPOFOL;  Surgeon: Lin Landsman, MD;  Location: [facility] ENDOSCOPY;  Service: Gastroenterology;  Laterality: N/A;   TONSILLECTOMY      Family History:  family history includes Heart Problems in her mother; Seizures in her father.   Social History:  Social History   Socioeconomic History   Marital status: Legally Separated    Spouse name: Not on file   Number of children: Not on file   Years of education: Not on file   Highest education level: Not on file  Occupational History   Not on file  Tobacco Use   Smoking status: Never   Smokeless tobacco: Never  Substance and Sexual Activity   Alcohol use: Not Currently   Drug use: Not Currently   Sexual activity: Not on file  Other Topics Concern   Not on file  Social History Narrative   Not on file   Social Determinants of Health   Financial Resource Strain: Not on file  Food Insecurity: Not on file  Transportation Needs: Not on file  Physical Activity: Not on file  Stress: Not on file  Social Connections: Not on file  Intimate Partner Violence: Not on file     Home Medications:  Medications reconciled in EPIC  No current facility-administered medications on file prior to encounter.   Current Outpatient Medications on File Prior to Encounter  Medication Sig Dispense Refill   albuterol (VENTOLIN HFA) 108 (90 Base) MCG/ACT inhaler Inhale 2 puffs into the lungs every 6 (six) hours as needed for wheezing or shortness of breath.     meclizine (ANTIVERT) 12.5 MG tablet Take 1 tablet (12.5 mg total) by mouth 3 (three) times daily as needed for up to 30 doses for dizziness. 30 tablet 0   vitamin  B-12 (CYANOCOBALAMIN) 1000 MCG tablet TAKE ONE TABLET BY MOUTH EVERY DAY 90 tablet 1   ondansetron (ZOFRAN) 4 MG tablet Take 1 tablet (4 mg total) by mouth every 6 (six) hours as needed for nausea. 20 tablet 0   pantoprazole (PROTONIX) 40 MG tablet TAKE ONE TABLET BY MOUTH TWO TIMES A DAY BEFORE A MEAL (Patient not taking: Reported on 09/19/2020) 180 tablet 0   pravastatin (PRAVACHOL) 40 MG tablet Take 40 mg by mouth daily. (Patient not taking: Reported on 09/19/2020)     triamcinolone ointment (KENALOG) 0.1 % Apply 1 application topically 2 (two) times daily.     vitamin B-12 (CYANOCOBALAMIN) 1000 MCG tablet Take 1 tablet (1,000 mcg total) by mouth daily. 90 tablet 1    Allergies:  Allergies  Allergen Reactions   Aspirin Nausea Only and Other (See Comments)    GI upset   Penicillins Rash    Physical Exam:  Temp:  [98.2 F (36.8 C)-99.1 F (37.3 C)] 98.2 F (36.8 C) (01/11 1949) Pulse Rate:  [86-100] 98 (01/11 1949) Resp:  [15-18] 18 (01/11 1949) BP: (113-169)/(59-80) 142/79 (01/11 1949) SpO2:  [94 %-100 %] 94 % (01/11 1949)   General Appearance:  Well developed, well nourished, no acute distress, alert and oriented x3 HEENT:  Normocephalic atraumatic, extraocular movements intact, moist mucous membranes Pulmonary:  nl effort Skin:  normal coloration  Psychiatric:  Normal mood and affect, appropriate, no AH/VH Pelvic:  small flat 1x1 cm moveable raised area of tissue located on left labia minor with tenderness with palpation. Another very small round lump <1cm noted under the tissue on the right labia majora   Assessment / Plan:   Jaime Fitzgerald is a 61 y.o. No obstetric history on file.  who presents with labia pain.  1. Labial pain - Ordered  Witch hazel Tucks pads - See Dr. Leafy Ro outpatient for further evaluation   Thank you for the opportunity to be involved with this pt's care.   Jenifer E Damare Serano 10/15/2021 11:03 PM

## 2021-10-15 NOTE — Progress Notes (Signed)
PROGRESS NOTE    Jaime Fitzgerald  FGH:[id number] DOB: 09-May-1961 DOA: 10/13/2021 [facility]CP: [facility]    Brief Narrative:  61 y.o. female with hx of hypertension, acute blood loss anemia with history of GI bleed, asthma, diabetes mellitus type 2, sleep apnea, obesity who presented with complaints of shortness of breath has been going on for the past 2 to 3 weeks which has been progressively getting worse and initially started with a cold upper respiratory kind of congestion symptoms.  TTE demonstrates ejection fraction mildly reduced at 45 to 50% and grade 3 diastolic dysfunction.   Assessment & Plan:   Principal Problem:   SOB (shortness of breath) Active Problems:   Hypertension   AKI (acute kidney injury) ([place])   Type 2 diabetes mellitus (HCC)   Seizures (HCC)   Acute respiratory failure with hypoxia (HCC)  Acute hypoxemic respiratory failure --per ED triage note, "Pt was found to be in 82%RA at office. EMS arrived and pt was on 2L at 96 %." --CTA chest showed Cardiomegaly with increased small moderate pericardial effusion, with no acute airspace disease.  Presentation most likely to be related to CHF.  No evidence of PNA, no wheezing to suggest asthma exacerbation. -- Hypoxia likely secondary to decompensated heart failure Plan: --treat CHF as below -- Supplemental oxygen as necessary   Acute combined CHF exacerbation -- Given grade 2 diastolic dysfunction suspect some prior diastolic dysfunction --started on IV lasix 20 mg BID initially -- Net -1.1 L since admission --Creatinine elevated, bicarbonate elevated Plan: -- Continue IV diuresis, Lasix 40 mg daily for now --Strict I/O --monitor Cr while diuresing   Acute kidney injury -- Baseline creatinine 1.  Elevated to 1.17 --Suspect secondary to diuresis --Diuretic dose decreased --Daily creatinine and electrolytes   Hypertension: No home blood pressure medications.   --on IV lasix for  diuresis   Diabetes mellitus type 2: --A1c 7.2. --no need for BG checks or SSI.  Labial pain Patient endorsing progressive pain from labia and small bump States that her outpatient providers are aware however I could not find documentation in care everywhere Plan: GYN consultation  Obesity BMI 67.8 This complicates overall care and prognosis RD consultation     DVT prophylaxis: SQ Lovenox Code Status: Full Family Communication: None today Disposition Plan: Status is: Inpatient  Remains inpatient appropriate because: Acute decompensated heart failure on IV diuretics       Level of care: Med-Surg  Consultants:  OB/GYN  Procedures:  None  Antimicrobials: None   Subjective: Seen and examined.  Sitting comfortably in bed.  No visible distress.  States her shortness of breath is improved.  Objective: Vitals:   10/14/21 2122 10/15/21 0211 10/15/21 0533 10/15/21 0735  BP: 131/77 125/71 126/80 123/69  Pulse: 95 90 90 86  Resp: 18 15 18 17   Temp: 98.5 F (36.9 C) 98.9 F (37.2 C) 98.4 F (36.9 C) 98.2 F (36.8 C)  TempSrc:   Oral   SpO2: 91% 100% 96% 95%  Weight:      Height:        Intake/Output Summary (Last 24 hours) at 10/15/2021 1129 Last data filed at 10/15/2021 0309 Gross per 24 hour  Intake 100 ml  Output 700 ml  Net -600 ml   Filed Weights   10/14/21 1544  Weight: 122.7 kg    Examination:  General exam: Appears calm and comfortable  Respiratory system: Bibasilar crackles.  Normal work of breathing.  2 L Cardiovascular system:  S1-S2, RRR, no murmurs, 1+ pedal edema bilateral Gastrointestinal system: Obese, NT/ND, normal bowel sounds Central nervous system: Alert and oriented. No focal neurological deficits. Extremities: Symmetric 5 x 5 power. Skin: No rashes, lesions or ulcers Psychiatry: Judgement and insight appear normal. Mood & affect appropriate.     Data Reviewed: I have personally reviewed following labs and imaging  studies  CBC: Recent Labs  Lab 10/13/21 1013 10/14/21 0631 10/15/21 0422  WBC 6.9 5.6 6.3  NEUTROABS  --  3.2  --   HGB 14.2 14.2 14.1  HCT 44.3 44.5 44.6  MCV 107.3* 108.0* 107.7*  PLT 216 221 379   Basic Metabolic Panel: Recent Labs  Lab 10/13/21 1013 10/14/21 0631 10/15/21 0422  NA 139 140 137  K 3.8 4.2 4.0  CL 95* 96* 91*  CO2 35* 36* 39*  GLUCOSE 127* 148* 133*  BUN 22 16 19   CREATININE 1.15* 0.99 1.13*  CALCIUM 8.6* 8.3* 8.5*  MG  --   --  1.9   GFR: Estimated Creatinine Clearance: 65.6 mL/min (A) (by C-G formula based on SCr of 1.13 mg/dL (H)). Liver Function Tests: Recent Labs  Lab 10/13/21 1013  AST 18  ALT 11  ALKPHOS 59  BILITOT 0.5  PROT 7.3  ALBUMIN 3.9   No results for input(s): LIPASE, AMYLASE in the last 168 hours. No results for input(s): AMMONIA in the last 168 hours. Coagulation Profile: No results for input(s): INR, PROTIME in the last 168 hours. Cardiac Enzymes: No results for input(s): CKTOTAL, CKMB, CKMBINDEX, TROPONINI in the last 168 hours. BNP (last 3 results) No results for input(s): PROBNP in the last 8760 hours. HbA1C: Recent Labs    10/14/21 0631  HGBA1C 7.2*   CBG: Recent Labs  Lab 10/15/21 0745  GLUCAP 149*   Lipid Profile: No results for input(s): CHOL, HDL, LDLCALC, TRIG, CHOLHDL, LDLDIRECT in the last 72 hours. Thyroid Function Tests: Recent Labs    10/14/21 0631  TSH 1.955  FREET4 0.89   Anemia Panel: No results for input(s): VITAMINB12, FOLATE, FERRITIN, TIBC, IRON, RETICCTPCT in the last 72 hours. Sepsis Labs: Recent Labs  Lab 10/13/21 1013 10/13/21 1531 10/14/21 0631  PROCALCITON <0.10  --   --   LATICACIDVEN  --  1.6 1.1    Recent Results (from the past 240 hour(s))  Blood culture (routine x 2)     Status: None (Preliminary result)   Collection Time: 10/13/21  3:31 PM   Specimen: BLOOD  Result Value Ref Range Status   Specimen Description BLOOD RIGHT ANTECUBITAL  Final   Special  Requests   Final    BOTTLES DRAWN AEROBIC AND ANAEROBIC Blood Culture adequate volume   Culture   Final    NO GROWTH 2 DAYS Performed at [facility], [address], [place], [place] [address]    Report Status PENDING  Incomplete  Blood culture (routine x 2)     Status: None (Preliminary result)   Collection Time: 10/13/21  3:31 PM   Specimen: BLOOD  Result Value Ref Range Status   Specimen Description BLOOD LEFT ANTECUBITAL  Final   Special Requests   Final    BOTTLES DRAWN AEROBIC AND ANAEROBIC Blood Culture adequate volume   Culture   Final    NO GROWTH 2 DAYS Performed at [facility], [address] [place], [place] [address]    Report Status PENDING  Incomplete  Resp Panel by RT-PCR (Flu A&B, Covid) Nasopharyngeal Swab  Status: None   Collection Time: 10/13/21  4:31 PM   Specimen: Nasopharyngeal Swab; Nasopharyngeal(NP) swabs in vial transport medium  Result Value Ref Range Status   SARS Coronavirus 2 by RT PCR NEGATIVE NEGATIVE Final    Comment: (NOTE) SARS-CoV-2 target nucleic acids are NOT DETECTED.  The SARS-CoV-2 RNA is generally detectable in upper respiratory specimens during the acute phase of infection. The lowest concentration of SARS-CoV-2 viral copies this assay can detect is 138 copies/mL. A negative result does not preclude SARS-Cov-2 infection and should not be used as the sole basis for treatment or other patient management decisions. A negative result may occur with  improper specimen collection/handling, submission of specimen other than nasopharyngeal swab, presence of viral mutation(s) within the areas targeted by this assay, and inadequate number of viral copies(<138 copies/mL). A negative result must be combined with clinical observations, patient history, and epidemiological information. The expected result is Negative.  Fact Sheet for Patients:  EntrepreneurPulse.com.au  Fact Sheet for Healthcare  Providers:  IncredibleEmployment.be  This test is no t yet approved or cleared by the [place] FDA and  has been authorized for detection and/or diagnosis of SARS-CoV-2 by FDA under an Emergency Use Authorization (EUA). This EUA will remain  in effect (meaning this test can be used) for the duration of the COVID-19 declaration under Section 564(b)(1) of the Act, 21 U.S.C.section 360bbb-3(b)(1), unless the authorization is terminated  or revoked sooner.       Influenza A by PCR NEGATIVE NEGATIVE Final   Influenza B by PCR NEGATIVE NEGATIVE Final    Comment: (NOTE) The Xpert Xpress SARS-CoV-2/FLU/RSV plus assay is intended as an aid in the diagnosis of influenza from Nasopharyngeal swab specimens and should not be used as a sole basis for treatment. Nasal washings and aspirates are unacceptable for Xpert Xpress SARS-CoV-2/FLU/RSV testing.  Fact Sheet for Patients: EntrepreneurPulse.com.au  Fact Sheet for Healthcare Providers: IncredibleEmployment.be  This test is not yet approved or cleared by the [place] FDA and has been authorized for detection and/or diagnosis of SARS-CoV-2 by FDA under an Emergency Use Authorization (EUA). This EUA will remain in effect (meaning this test can be used) for the duration of the COVID-19 declaration under Section 564(b)(1) of the Act, 21 U.S.C. section 360bbb-3(b)(1), unless the authorization is terminated or revoked.  Performed at [facility], [address] [place], [place] [address]          Radiology Studies: CT Angio Chest PE W and/or Wo Contrast  Result Date: 10/13/2021 CLINICAL DATA:  Hypoxic EXAM: CT ANGIOGRAPHY CHEST WITH CONTRAST TECHNIQUE: Multidetector CT imaging of the chest was performed using the standard protocol during bolus administration of intravenous contrast. Multiplanar CT image reconstructions and MIPs were obtained to evaluate the vascular  anatomy. CONTRAST:  175mL OMNIPAQUE IOHEXOL 350 MG/ML SOLN COMPARISON:  Chest x-ray 10/13/2021, CT chest 06/13/2021 FINDINGS: Cardiovascular: Satisfactory opacification of the pulmonary arteries to the segmental level. No evidence of pulmonary embolism. Nonaneurysmal aorta. Mild atherosclerosis. No dissection. Cardiomegaly. Increased pericardial effusion, small moderate measuring 15 mm maximum thickness left cardiac margin. Mediastinum/Nodes: Midline trachea. No thyroid mass. Enlarged right paratracheal lymph nodes measuring up to 17 mm without significant change. Small right hilar lymph nodes. Esophagus normal Lungs/Pleura: Mild subpleural reticulation and emphysema. No consolidation, pleural effusion or pneumothorax Upper Abdomen: No acute abnormality. Musculoskeletal: No chest wall abnormality. No acute or significant osseous findings. Review of the MIP images confirms the above findings. IMPRESSION: 1. Negative for acute pulmonary embolus or  aortic dissection 2. Cardiomegaly with increased small moderate pericardial effusion 3. Mild subpleural reticulation consistent with chronic lung disease. No acute airspace disease 4. Similar mild mediastinal adenopathy Aortic Atherosclerosis (ICD10-I70.0) and Emphysema (ICD10-J43.9). Electronically Signed   By: Donavan Foil M.D.   On: 10/13/2021 19:49   ECHOCARDIOGRAM COMPLETE  Result Date: 10/14/2021    ECHOCARDIOGRAM REPORT   Patient Name:   Jaime Fitzgerald Date of Exam: 10/14/2021 Medical Rec #:  [id number]      Height:       63.0 in Accession #:    [id number]     Weight:       270.0 lb Date of Birth:  1961-03-23      BSA:          2.197 m Patient Age:    58 years       BP:           165/93 mmHg Patient Gender: F              HR:           90 bpm. Exam Location:  [facility] Procedure: 2D Echo, Color Doppler and Cardiac Doppler Indications:     I50.21 congestive heart failure-Acute Systolic  History:         Patient has no prior history of Echocardiogram examinations.                   Signs/Symptoms:Shortness of Breath, Chest Pain, Edema and Back                  pain; Risk Factors:Hypertension, Diabetes and Sleep Apnea.  Sonographer:     Charmayne Sheer Referring Phys:  Scott Diagnosing Phys: Neoma Laming  Sonographer Comments: Suboptimal subcostal window. IMPRESSIONS  1. Left ventricular ejection fraction, by estimation, is 45 to 50%. The left ventricle has mildly decreased function. The left ventricle has no regional wall motion abnormalities. Left ventricular diastolic parameters are consistent with Grade III diastolic dysfunction (restrictive).  2. Right ventricular systolic function is moderately reduced. The right ventricular size is moderately enlarged.  3. Left atrial size was mildly dilated.  4. Right atrial size was mildly dilated.  5. The mitral valve is normal in structure. Trivial mitral valve regurgitation. No evidence of mitral stenosis.  6. The aortic valve is normal in structure. Aortic valve regurgitation is not visualized. No aortic stenosis is present.  7. The inferior vena cava is normal in size with greater than 50% respiratory variability, suggesting right atrial pressure of 3 mmHg. FINDINGS  Left Ventricle: Left ventricular ejection fraction, by estimation, is 45 to 50%. The left ventricle has mildly decreased function. The left ventricle has no regional wall motion abnormalities. The left ventricular internal cavity size was normal in size. There is no left ventricular hypertrophy. Left ventricular diastolic parameters are consistent with Grade III diastolic dysfunction (restrictive). Right Ventricle: The right ventricular size is moderately enlarged. No increase in right ventricular wall thickness. Right ventricular systolic function is moderately reduced. Left Atrium: Left atrial size was mildly dilated. Right Atrium: Right atrial size was mildly dilated. Pericardium: There is no evidence of pericardial effusion. Mitral Valve: The mitral valve is  normal in structure. Trivial mitral valve regurgitation. No evidence of mitral valve stenosis. MV peak gradient, 4.1 mmHg. The mean mitral valve gradient is 2.0 mmHg. Tricuspid Valve: The tricuspid valve is normal in structure. Tricuspid valve regurgitation is trivial. No evidence of tricuspid stenosis. Aortic Valve: The aortic valve  is normal in structure. Aortic valve regurgitation is not visualized. No aortic stenosis is present. Aortic valve mean gradient measures 6.0 mmHg. Aortic valve peak gradient measures 11.2 mmHg. Aortic valve area, by VTI measures 2.15 cm. Pulmonic Valve: The pulmonic valve was normal in structure. Pulmonic valve regurgitation is not visualized. No evidence of pulmonic stenosis. Aorta: The aortic root is normal in size and structure. Venous: The inferior vena cava is normal in size with greater than 50% respiratory variability, suggesting right atrial pressure of 3 mmHg. IAS/Shunts: No atrial level shunt detected by color flow Doppler.  LEFT VENTRICLE PLAX 2D LVIDd:         4.61 cm   Diastology LVIDs:         3.55 cm   LV e' medial:    5.55 cm/s LV PW:         0.98 cm   LV E/e' medial:  14.1 LV IVS:        0.82 cm   LV e' lateral:   6.42 cm/s LVOT diam:     2.00 cm   LV E/e' lateral: 12.1 LV SV:         62 LV SV Index:   28 LVOT Area:     3.14 cm  RIGHT VENTRICLE RV Basal diam:  3.48 cm LEFT ATRIUM             Index        RIGHT ATRIUM           Index LA diam:        3.40 cm 1.55 cm/m   RA Area:     10.10 cm LA Vol (A2C):   42.1 ml 19.17 ml/m  RA Volume:   17.00 ml  7.74 ml/m LA Vol (A4C):   31.9 ml 14.52 ml/m LA Biplane Vol: 37.4 ml 17.03 ml/m  AORTIC VALVE                     PULMONIC VALVE AV Area (Vmax):    2.16 cm      PV Vmax:       1.11 m/s AV Area (Vmean):   2.07 cm      PV Vmean:      76.300 cm/s AV Area (VTI):     2.15 cm      PV VTI:        0.188 m AV Vmax:           167.00 cm/s   PV Peak grad:  4.9 mmHg AV Vmean:          118.000 cm/s  PV Mean grad:  3.0 mmHg AV  VTI:            0.288 m AV Peak Grad:      11.2 mmHg AV Mean Grad:      6.0 mmHg LVOT Vmax:         115.00 cm/s LVOT Vmean:        77.600 cm/s LVOT VTI:          0.197 m LVOT/AV VTI ratio: 0.68  AORTA Ao Root diam: 2.50 cm MITRAL VALVE MV Area (PHT): 4.60 cm     SHUNTS MV Area VTI:   3.19 cm     Systemic VTI:  0.20 m MV Peak grad:  4.1 mmHg     Systemic Diam: 2.00 cm MV Mean grad:  2.0 mmHg MV Vmax:       1.01 m/s MV Vmean:  67.1 cm/s MV Decel Time: 165 msec MV E velocity: 78.00 cm/s MV A velocity: 100.00 cm/s MV E/A ratio:  0.78 Shaukat Khan Electronically signed by Neoma Laming Signature Date/Time: 10/14/2021/11:41:37 AM    Final         Scheduled Meds:  enoxaparin (LOVENOX) injection  0.5 mg/kg Subcutaneous Q24H   furosemide  40 mg Intravenous Daily   pantoprazole (PROTONIX) IV  40 mg Intravenous Q24H   sodium chloride flush  3 mL Intravenous Q12H   sucralfate  1 g Oral TID WC & HS   Continuous Infusions:   LOS: 2 days    Time spent: 45 minutes    Sidney Ace, MD [facility]   If 7PM-7AM, please contact night-coverage  10/15/2021, 11:29 AM

## 2021-10-15 NOTE — TOC Initial Note (Addendum)
Transition of Care [facility]) - Initial/Assessment Note    Patient Details  Name: Jaime Fitzgerald MRN: [id number] Date of Birth: [birth year]  [facility] of [facility]) CM/SW Contact:    Candie Chroman, LCSW Phone Number: 10/15/2021, 10:22 AM  Clinical Narrative:   CSW met with patient. No supports at bedside. CSW introduced role and explained that PT recommendations would be discussed. Patient prefers home health rather than SNF placement. She reports that her caregiver, Orlinda Blalock, is at her home basically 24/7. Her daughter lives in [place] and checks in on her as well. She has a son that lives in [place]. Will follow up with home health agencies closer to discharge. Patient has a shower chair at home but it is broken. Will see if we can get her a new one at discharge. Patient on 3 L acute. She said she was previously supposed to get home oxygen but it never worked out. Will follow for this potential need as well. Patient agreeable to RW. Patient is unsure how she will get home at discharge. She uses [facility] to get to appointments so this may be an option if we know discharge date ahead of time. Tried calling Mr. Talib Headley. Voicemail is full. Discussed potential need for EMS home. No further concerns. CSW encouraged patient to contact CSW as needed. CSW will continue to follow patient for support and facilitate return home at discharge.               2:10 pm: Tried calling caregiver again. No answer.  Expected Discharge Plan: [place] Barriers to Discharge: Continued Medical Work up   Patient Goals and CMS Choice        Expected Discharge Plan and Services Expected Discharge Plan: [place] Acute Care Choice: Home Health, Durable Medical Equipment Living arrangements for the past 2 months: Single Family Home                                      Prior Living Arrangements/Services Living arrangements for the past 2 months: Single  Family Home Lives with:: Other (Comment) (Caregiver) Patient language and need for interpreter reviewed:: Yes Do you feel safe going back to the place where you live?: Yes      Need for Family Participation in Patient Care: Yes (Comment) Care giver support system in place?: Yes (comment) Current home services: DME Criminal Activity/Legal Involvement Pertinent to Current Situation/Hospitalization: No - Comment as needed  Activities of Daily Living Home Assistive Devices/Equipment: None ADL Screening (condition at time of admission) Patient's cognitive ability adequate to safely complete daily activities?: Yes Is the patient deaf or have difficulty hearing?: No Does the patient have difficulty seeing, even when wearing glasses/contacts?: No Does the patient have difficulty concentrating, remembering, or making decisions?: No Patient able to express need for assistance with ADLs?: Yes Does the patient have difficulty dressing or bathing?: Yes Independently performs ADLs?: Yes (appropriate for developmental age) Does the patient have difficulty walking or climbing stairs?: Yes Weakness of Legs: Both Weakness of Arms/Hands: None  Permission Sought/Granted Permission sought to share information with : Family Supports    Share Information with NAME: Orlinda Blalock  Permission granted to share info w AGENCY: [place] granted to share info w Relationship: Caregiver     Emotional Assessment Appearance:: Appears stated age Attitude/Demeanor/Rapport: Engaged, Gracious  Affect (typically observed): Accepting, Appropriate, Calm, Pleasant Orientation: : Oriented to Self, Oriented to Place, Oriented to  Time, Oriented to Situation Alcohol / Substance Use: Not Applicable Psych Involvement: No (comment)  Admission diagnosis:  Acute respiratory failure with hypoxia ([place]) [J96.01] Acute respiratory failure with hypoxemia ([place]) [J96.01] Community acquired pneumonia of right  lower lobe of lung [J18.9] Patient Active Problem List   Diagnosis Date Noted   SOB (shortness of breath) 10/13/2021   Acute respiratory failure with hypoxia ([place]) 10/13/2021   Infectious colitis 09/18/2020   History of gastric ulcer with hemorrhage 09/17/2020   Hematemesis 09/17/2020   Enteropathogenic Escherichia coli infection 09/17/2020   Campylobacter intestinal infection 09/17/2020   Melena 09/17/2020   Acute infective gastroenteritis 09/17/2020   Acute colitis 09/17/2020   Screening for colon cancer    Other synovitis and tenosynovitis, unspecified hand 10/31/2019   Diverticulosis 10/31/2019   Migraine headache 10/31/2019   Ovarian cyst 10/31/2019   Pain in thoracic spine 10/31/2019   Seizures ([place]) 10/31/2019   Acute GI bleeding 10/11/2019   Hypertension 10/11/2019   AKI (acute kidney injury) ([place]) 10/11/2019   Syncope and collapse 10/11/2019   Type 2 diabetes mellitus ([place]) 10/11/2019   Acute blood loss anemia 10/11/2019   Acute gastric ulcer with hemorrhage    PCP:  [facility] [place]:   [place], [place] - [phone] [place] [address] Phone: [phone] Fax: [phone]     Social Determinants of Health (SDOH) Interventions    Readmission Risk Interventions No flowsheet data found.

## 2021-10-16 LAB — CBC
HCT: 45.6 % (ref 36.0–46.0)
Hemoglobin: 14.6 g/dL (ref 12.0–15.0)
MCH: 34.4 pg — ABNORMAL HIGH (ref 26.0–34.0)
MCHC: 32 g/dL (ref 30.0–36.0)
MCV: 107.5 fL — ABNORMAL HIGH (ref 80.0–100.0)
Platelets: 217 10*3/uL (ref 150–400)
RBC: 4.24 MIL/uL (ref 3.87–5.11)
RDW: 13.3 % (ref 11.5–15.5)
WBC: 5.9 10*3/uL (ref 4.0–10.5)
nRBC: 0 % (ref 0.0–0.2)

## 2021-10-16 LAB — BASIC METABOLIC PANEL
Anion gap: 10 (ref 5–15)
BUN: 18 mg/dL (ref 8–23)
CO2: 35 mmol/L — ABNORMAL HIGH (ref 22–32)
Calcium: 8.7 mg/dL — ABNORMAL LOW (ref 8.9–10.3)
Chloride: 92 mmol/L — ABNORMAL LOW (ref 98–111)
Creatinine, Ser: 0.99 mg/dL (ref 0.44–1.00)
GFR, Estimated: >60 mL/min (ref >60)
Glucose, Bld: 139 mg/dL — ABNORMAL HIGH (ref 70–99)
Potassium: 4 mmol/L (ref 3.5–5.1)
Sodium: 137 mmol/L (ref 135–145)

## 2021-10-16 LAB — MAGNESIUM: Magnesium: 2.3 mg/dL (ref 1.7–2.4)

## 2021-10-16 NOTE — TOC Progression Note (Addendum)
Transition of Care [facility]) - Progression Note    Patient Details  Name: Jaime Fitzgerald MRN: [id number] Date of Birth: 1961-01-11  Transition of Care [facility]) CM/SW [place], LCSW Phone Number: 10/16/2021, 11:52 AM  Clinical Narrative:   Have been unable to reach Mr. Charlynn Grimes. Unable to get through to daughter by phone. Left voicemail for sister. Per MD, likely discharge tomorrow.  1:01: Tried to call Mr. Charlynn Grimes and daughter again but unable to get through. No call back from sister yet. Left voicemail for one of the owners of her [place] agency. [facility] is booked tomorrow and won't be able to take her home. Called friend Horald Pollen. She said she could take her home tomorrow.  4:12 pm: Ordered oxygen, RW, and shower chair through [employer]. [facility] and [facility] unable to accept for home health. Left messages for other home health agencies.  4:45 pm: [facility], [facility], [facility], and [place] unable to accept. Left messages for [facility] and [facility].  Expected Discharge Plan: [place] Barriers to Discharge: Continued Medical Work up  Expected Discharge Plan and Services Expected Discharge Plan: [place] Choice: Home Health, Durable Medical Equipment Living arrangements for the past 2 months: Single Family Home                                       Social Determinants of Health (SDOH) Interventions    Readmission Risk Interventions No flowsheet data found.

## 2021-10-16 NOTE — Progress Notes (Signed)
Physical Therapy Treatment Patient Details Name: Jaime Fitzgerald MRN: [id number] DOB: 1960-11-17 Today's Date: 10/16/2021   History of Present Illness 61 y.o. female came to ED with complaints of shortness of breath that has been going on for the past 2 to 3 weeks,reports that it has been progressively getting worse and initially started with a cold upper respiratory kind of congestion symptoms.  Also c/o orthopenia, hypoxic and placed on O2.    PT Comments    Pt was standing at doorway of room with chair alarm going off upon arriving. She is alert but lacks insight of deficits. Pt still had purewick attached without O2 on. Sao2 86% at rest without O2. She is able to maintain > 88% on 2 L o2. Pt tolerated ambulation 120 ft total but has some unsteadiness noted throughout. Attempted to wean O2 during gait training however pt again dropped to 86% on rm air. HR elevated to 128 bpm.   Pt is planning to DC home tomorrow. Author attempted to reach pt's daughter and friend Scientist, physiological) without success. I personally did speak to pt's sister in-law who reports pt does not have home O2 or 24/7 assistance. Author feels pt will greatly benefit from 24/7 assistance and HHPT if she elects to DC straight home from acute hospital.     Recommendations for follow up therapy are one component of a multi-disciplinary discharge planning process, led by the attending physician.  Recommendations may be updated based on patient status, additional functional criteria and insurance authorization.  Follow Up Recommendations  Skilled nursing-short term rehab (<3 hours/day) (24/7 supervision recommended if pt decides to DC straight home. She is impulsive and has unsteadiness during ambulation. High fall risk Overall.)     Assistance Recommended at Discharge Frequent or constant Supervision/Assistance  Patient can return home with the following A little help with bathing/dressing/bathroom;A little help with walking and/or  transfers   Equipment Recommendations  Rolling walker (2 wheels) (home O2)       Precautions / Restrictions Precautions Precautions: Fall Restrictions Weight Bearing Restrictions: No     Mobility  Bed Mobility      General bed mobility comments: in recliner post session    Transfers Overall transfer level: Needs assistance Equipment used: Rolling walker (2 wheels) Transfers: Sit to/from Stand Sit to Stand: Supervision    General transfer comment: pt does not require physical assistance to stand however pt does require Vcs for safety awareness and improved safety with all OOB activity    Ambulation/Gait Ambulation/Gait assistance: Min guard Gait Distance (Feet): 120 Feet Assistive device: Rolling walker (2 wheels) Gait Pattern/deviations: Step-through pattern Gait velocity: decreased     General Gait Details: pt was able to ambulate with RW 120 ft with some unsteadiness noted. Vcs for increased BOS and improved overall safety during gait training. Did desaturate to 86% on rm air during gait training     Balance Overall balance assessment: Needs assistance Sitting-balance support: Bilateral upper extremity supported Sitting balance-Leahy Scale: Good     Standing balance support: Bilateral upper extremity supported Standing balance-Leahy Scale: Fair Standing balance comment: Very reliant on walker, unsteadiness with no overt LOBs.      Cognition Arousal/Alertness: Awake/alert Behavior During Therapy: WFL for tasks assessed/performed;Impulsive Overall Cognitive Status: Within Functional Limits for tasks assessed      General Comments: Pt was standing at doorway of room without O2 on and purewick still in place. Author assisted pt back to recliner. sao2 86% without O2 donned. She required 2  L o2 to maintain > 88% sao2.           General Comments General comments (skin integrity, edema, etc.): Attmpted to reach friend Sonia Side and daughter via phone however unable.  PT recommends supervision at DC for safety due to pt's poor awareness.      Pertinent Vitals/Pain Pain Assessment: No/denies pain Pain Score: 0-No pain     PT Goals (current goals can now be found in the care plan section) Acute Rehab PT Goals Patient Stated Goal: go home Progress towards PT goals: Progressing toward goals    Frequency    Min 2X/week      PT Plan Current plan remains appropriate       AM-PAC PT "6 Clicks" Mobility   Outcome Measure  Help needed turning from your back to your side while in a flat bed without using bedrails?: None           6 Click Score: 4    End of Session Equipment Utilized During Treatment: Gait belt Activity Tolerance: Patient tolerated treatment well;Patient limited by fatigue Patient left: in chair;with nursing/sitter in room;with call bell/phone within reach (RN students x ~ 10 in room at conclusion of session) Nurse Communication: Mobility status PT Visit Diagnosis: Muscle weakness (generalized) (M62.81);Difficulty in walking, not elsewhere classified (R26.2)     Time: 3845-3646 PT Time Calculation (min) (ACUTE ONLY): 16 min  Charges:  $Gait Training: 8-22 mins                     Julaine Fusi PTA 10/16/21, 3:47 PM

## 2021-10-16 NOTE — Progress Notes (Signed)
PROGRESS NOTE    Jaime Fitzgerald  DJS:[id number] DOB: [birth year] DOA: 10/13/2021 PCP: [facility] [place]    Brief Narrative:  61 y.o. female with hx of hypertension, acute blood loss anemia with history of GI bleed, asthma, diabetes mellitus type 2, sleep apnea, obesity who presented with complaints of shortness of breath has been going on for the past 2 to 3 weeks which has been progressively getting worse and initially started with a cold upper respiratory kind of congestion symptoms.  TTE demonstrates ejection fraction mildly reduced at 45 to 50% and grade 3 diastolic dysfunction.   Assessment & Plan:   Principal Problem:   SOB (shortness of breath) Active Problems:   Hypertension   AKI (acute kidney injury) ([place])   Type 2 diabetes mellitus (HCC)   Seizures (HCC)   Acute respiratory failure with hypoxia (HCC)  Acute hypoxemic respiratory failure --per ED triage note, "Pt was found to be in 82%RA at office. EMS arrived and pt was on 2L at 96 %." --CTA chest showed Cardiomegaly with increased small moderate pericardial effusion, with no acute airspace disease.  Presentation most likely to be related to CHF.  No evidence of PNA, no wheezing to suggest asthma exacerbation. -- Hypoxia likely secondary to decompensated heart failure Plan: Continue IV diuresis for decompensated heart failure Supplemental oxygen if necessary Plan ambulatory desaturation test tomorrow prior to discharge planning   Acute combined CHF exacerbation -- Given grade 2 diastolic dysfunction suspect some prior diastolic dysfunction --started on IV lasix 20 mg BID initially -- Net -1.1 L since admission --Creatinine elevated, bicarbonate elevated Plan: -- Continue IV diuresis, Lasix 40 mg IV.   Transition to low-dose p.o. Lasix tomorrow --Strict I/O --monitor Cr while diuresing   Acute kidney injury -- Baseline creatinine 1.  Elevated to 1.17, now resolved --Suspect secondary to  diuresis -- Continue Lasix 40 mg IV daily  --Daily creatinine and electrolytes   Hypertension: No home blood pressure medications.   --on IV lasix for diuresis   Diabetes mellitus type 2: --A1c 7.2. --no need for BG checks or SSI.  Labial pain Patient endorsing progressive pain from labia and small bump States that her outpatient providers are aware however I could not find documentation in care everywhere -[facility] consultation Plan: Appreciate inpatient recommendations.  Outpatient GYN referral  Obesity BMI 88.5 This complicates overall care and prognosis RD consultation     DVT prophylaxis: SQ Lovenox Code Status: Full Family Communication: None today Disposition Plan: Status is: Inpatient  Remains inpatient appropriate because: Acute decompensated heart failure on IV diuretics.  Wife status improving.  Anticipate discharge 1/13       Level of care: Med-Surg  Consultants:  OB/GYN  Procedures:  None  Antimicrobials: None   Subjective: Seen and examined.  Sitting comfortably in bed.  No visible distress.  States her shortness of breath is improved.  Objective: Vitals:   10/15/21 1759 10/15/21 1949 10/16/21 0450 10/16/21 0735  BP: 113/74 (!) 142/79 136/75 (!) 157/96  Pulse: 95 98 96 94  Resp: 18 18 18 15   Temp:  98.2 F (36.8 C) 98.3 F (36.8 C) 98.1 F (36.7 C)  TempSrc:  Oral Oral   SpO2: 100% 94% 98% 93%  Weight:      Height:        Intake/Output Summary (Last 24 hours) at 10/16/2021 1048 Last data filed at 10/16/2021 0500 Gross per 24 hour  Intake --  Output 1750 ml  Net -1750 ml  Filed Weights   10/14/21 1544  Weight: 122.7 kg    Examination:  General exam: Appears calm and comfortable  Respiratory system: Mild bibasilar crackles.  Normal work of breathing.  2 L Cardiovascular system: S1-S2, RRR, no murmurs, trace pedal edema BLE Gastrointestinal system: Obese, NT/ND, normal bowel sounds Central nervous system: Alert and  oriented. No focal neurological deficits. Extremities: Symmetric 5 x 5 power. Skin: No rashes, lesions or ulcers Psychiatry: Judgement and insight appear normal. Mood & affect appropriate.     Data Reviewed: I have personally reviewed following labs and imaging studies  CBC: Recent Labs  Lab 10/13/21 1013 10/14/21 0631 10/15/21 0422 10/16/21 0434  WBC 6.9 5.6 6.3 5.9  NEUTROABS  --  3.2  --   --   HGB 14.2 14.2 14.1 14.6  HCT 44.3 44.5 44.6 45.6  MCV 107.3* 108.0* 107.7* 107.5*  PLT 216 221 222 353   Basic Metabolic Panel: Recent Labs  Lab 10/13/21 1013 10/14/21 0631 10/15/21 0422 10/16/21 0434  NA 139 140 137 137  K 3.8 4.2 4.0 4.0  CL 95* 96* 91* 92*  CO2 35* 36* 39* 35*  GLUCOSE 127* 148* 133* 139*  BUN 22 16 19 18   CREATININE 1.15* 0.99 1.13* 0.99  CALCIUM 8.6* 8.3* 8.5* 8.7*  MG  --   --  1.9 2.3   GFR: Estimated Creatinine Clearance: 74.9 mL/min (by C-G formula based on SCr of 0.99 mg/dL). Liver Function Tests: Recent Labs  Lab 10/13/21 1013  AST 18  ALT 11  ALKPHOS 59  BILITOT 0.5  PROT 7.3  ALBUMIN 3.9   No results for input(s): LIPASE, AMYLASE in the last 168 hours. No results for input(s): AMMONIA in the last 168 hours. Coagulation Profile: No results for input(s): INR, PROTIME in the last 168 hours. Cardiac Enzymes: No results for input(s): CKTOTAL, CKMB, CKMBINDEX, TROPONINI in the last 168 hours. BNP (last 3 results) No results for input(s): PROBNP in the last 8760 hours. HbA1C: Recent Labs    10/14/21 0631  HGBA1C 7.2*   CBG: Recent Labs  Lab 10/15/21 0745 10/15/21 1243  GLUCAP 149* 148*   Lipid Profile: No results for input(s): CHOL, HDL, LDLCALC, TRIG, CHOLHDL, LDLDIRECT in the last 72 hours. Thyroid Function Tests: Recent Labs    10/14/21 0631  TSH 1.955  FREET4 0.89   Anemia Panel: No results for input(s): VITAMINB12, FOLATE, FERRITIN, TIBC, IRON, RETICCTPCT in the last 72 hours. Sepsis Labs: Recent Labs  Lab  10/13/21 1013 10/13/21 1531 10/14/21 0631  PROCALCITON <0.10  --   --   LATICACIDVEN  --  1.6 1.1    Recent Results (from the past 240 hour(s))  Blood culture (routine x 2)     Status: None (Preliminary result)   Collection Time: 10/13/21  3:31 PM   Specimen: BLOOD  Result Value Ref Range Status   Specimen Description BLOOD RIGHT ANTECUBITAL  Final   Special Requests   Final    BOTTLES DRAWN AEROBIC AND ANAEROBIC Blood Culture adequate volume   Culture   Final    NO GROWTH 3 DAYS Performed at [facility], [address] [place], [place] [address]    Report Status PENDING  Incomplete  Blood culture (routine x 2)     Status: None (Preliminary result)   Collection Time: 10/13/21  3:31 PM   Specimen: BLOOD  Result Value Ref Range Status   Specimen Description BLOOD LEFT ANTECUBITAL  Final   Special Requests   Final  BOTTLES DRAWN AEROBIC AND ANAEROBIC Blood Culture adequate volume   Culture   Final    NO GROWTH 3 DAYS Performed at [facility], [address] [place], [place] [address]    Report Status PENDING  Incomplete  Resp Panel by RT-PCR (Flu A&B, Covid) Nasopharyngeal Swab     Status: None   Collection Time: 10/13/21  4:31 PM   Specimen: Nasopharyngeal Swab; Nasopharyngeal(NP) swabs in vial transport medium  Result Value Ref Range Status   SARS Coronavirus 2 by RT PCR NEGATIVE NEGATIVE Final    Comment: (NOTE) SARS-CoV-2 target nucleic acids are NOT DETECTED.  The SARS-CoV-2 RNA is generally detectable in upper respiratory specimens during the acute phase of infection. The lowest concentration of SARS-CoV-2 viral copies this assay can detect is 138 copies/mL. A negative result does not preclude SARS-Cov-2 infection and should not be used as the sole basis for treatment or other patient management decisions. A negative result may occur with  improper specimen collection/handling, submission of specimen other than nasopharyngeal swab, presence  of viral mutation(s) within the areas targeted by this assay, and inadequate number of viral copies(<138 copies/mL). A negative result must be combined with clinical observations, patient history, and epidemiological information. The expected result is Negative.  Fact Sheet for Patients:  EntrepreneurPulse.com.au  Fact Sheet for Healthcare Providers:  IncredibleEmployment.be  This test is no t yet approved or cleared by the [place] FDA and  has been authorized for detection and/or diagnosis of SARS-CoV-2 by FDA under an Emergency Use Authorization (EUA). This EUA will remain  in effect (meaning this test can be used) for the duration of the COVID-19 declaration under Section 564(b)(1) of the Act, 21 U.S.C.section 360bbb-3(b)(1), unless the authorization is terminated  or revoked sooner.       Influenza A by PCR NEGATIVE NEGATIVE Final   Influenza B by PCR NEGATIVE NEGATIVE Final    Comment: (NOTE) The Xpert Xpress SARS-CoV-2/FLU/RSV plus assay is intended as an aid in the diagnosis of influenza from Nasopharyngeal swab specimens and should not be used as a sole basis for treatment. Nasal washings and aspirates are unacceptable for Xpert Xpress SARS-CoV-2/FLU/RSV testing.  Fact Sheet for Patients: EntrepreneurPulse.com.au  Fact Sheet for Healthcare Providers: IncredibleEmployment.be  This test is not yet approved or cleared by the [place] FDA and has been authorized for detection and/or diagnosis of SARS-CoV-2 by FDA under an Emergency Use Authorization (EUA). This EUA will remain in effect (meaning this test can be used) for the duration of the COVID-19 declaration under Section 564(b)(1) of the Act, 21 U.S.C. section 360bbb-3(b)(1), unless the authorization is terminated or revoked.  Performed at [facility], [address] [place], [place] [address]          Radiology  Studies: No results found.      Scheduled Meds:  enoxaparin (LOVENOX) injection  0.5 mg/kg Subcutaneous Q24H   furosemide  40 mg Intravenous Daily   pantoprazole (PROTONIX) IV  40 mg Intravenous Q24H   polyethylene glycol  17 g Oral Daily   sodium chloride flush  3 mL Intravenous Q12H   sucralfate  1 g Oral TID WC & HS   Continuous Infusions:   LOS: 3 days    Time spent: 35 minutes    Sidney Ace, MD [facility]   If 7PM-7AM, please contact night-coverage  10/16/2021, 10:48 AM

## 2021-10-17 LAB — CBC
HCT: 49.7 % — ABNORMAL HIGH (ref 36.0–46.0)
Hemoglobin: 16 g/dL — ABNORMAL HIGH (ref 12.0–15.0)
MCH: 34.5 pg — ABNORMAL HIGH (ref 26.0–34.0)
MCHC: 32.2 g/dL (ref 30.0–36.0)
MCV: 107.1 fL — ABNORMAL HIGH (ref 80.0–100.0)
Platelets: 217 10*3/uL (ref 150–400)
RBC: 4.64 MIL/uL (ref 3.87–5.11)
RDW: 13.5 % (ref 11.5–15.5)
WBC: 6.5 10*3/uL (ref 4.0–10.5)
nRBC: 0 % (ref 0.0–0.2)

## 2021-10-17 LAB — BASIC METABOLIC PANEL
Anion gap: 11 (ref 5–15)
BUN: 27 mg/dL — ABNORMAL HIGH (ref 8–23)
CO2: 31 mmol/L (ref 22–32)
Calcium: 9.5 mg/dL (ref 8.9–10.3)
Chloride: 94 mmol/L — ABNORMAL LOW (ref 98–111)
Creatinine, Ser: 1.18 mg/dL — ABNORMAL HIGH (ref 0.44–1.00)
GFR, Estimated: 52 mL/min — ABNORMAL LOW (ref >60)
Glucose, Bld: 139 mg/dL — ABNORMAL HIGH (ref 70–99)
Potassium: 4.1 mmol/L (ref 3.5–5.1)
Sodium: 136 mmol/L (ref 135–145)

## 2021-10-17 LAB — MAGNESIUM: Magnesium: 2.2 mg/dL (ref 1.7–2.4)

## 2021-10-17 MED ORDER — PRAVASTATIN SODIUM 40 MG PO TABS: 40.0000 mg | ORAL_TABLET | Freq: Every day | ORAL | 0 refills | Status: DC

## 2021-10-17 MED ORDER — WITCH HAZEL-GLYCERIN EX PADS: MEDICATED_PAD | CUTANEOUS | 0 refills | Status: DC | PRN

## 2021-10-17 MED ORDER — FUROSEMIDE 20 MG PO TABS
20.0000 mg | ORAL_TABLET | Freq: Every day | ORAL | Status: DC
  Administered 2021-10-17: 20 mg via ORAL
  Filled 2021-10-17: qty 1

## 2021-10-17 MED ORDER — FUROSEMIDE 20 MG PO TABS: 20.0000 mg | ORAL_TABLET | Freq: Every day | ORAL | 0 refills | Status: DC

## 2021-10-17 MED ORDER — ONDANSETRON HCL 4 MG PO TABS: 4.0000 mg | ORAL_TABLET | Freq: Four times a day (QID) | ORAL | 0 refills | Status: DC | PRN

## 2021-10-17 MED ORDER — PANTOPRAZOLE SODIUM 40 MG PO TBEC: 40.0000 mg | DELAYED_RELEASE_TABLET | Freq: Every day | ORAL | 0 refills | Status: DC

## 2021-10-17 MED ORDER — METOPROLOL SUCCINATE ER 25 MG PO TB24: 25.0000 mg | ORAL_TABLET | Freq: Every day | ORAL | 0 refills | Status: DC

## 2021-10-17 NOTE — Discharge Summary (Signed)
Physician Discharge Summary  Jaime Fitzgerald SHF:[id number] DOB: 1961/08/13 DOA: 10/13/2021  PCP: [facility] [place] date: 10/13/2021 Discharge date: 10/17/2021  Admitted From: Home Disposition: Home  Recommendations for Outpatient Follow-up:  Follow up with PCP in 1-2 weeks Ambulatory referral to cardiology  Home Health: No Equipment/Devices: Oxygen 2 L via nasal cannula, shower seat, walker  Discharge Condition: Stable CODE STATUS: Full Diet recommendation: Heart healthy  Brief/Interim Summary: 61 y.o. female with hx of hypertension, acute blood loss anemia with history of GI bleed, asthma, diabetes mellitus type 2, sleep apnea, obesity who presented with complaints of shortness of breath has been going on for the past 2 to 3 weeks which has been progressively getting worse and initially started with a cold upper respiratory kind of congestion symptoms.   TTE demonstrates ejection fraction mildly reduced at 45 to 50% and grade 3 diastolic dysfunction.  Patient was effectively diuresed.  Hypoxic respiratory failure improved however not totally resolved.  Patient remains dependent on 2 L supplemental oxygen at time of discharge.  This is likely multifactorial.  I will order DME oxygen.  Ambulatory referral to cardiology placed at time of discharge.  Patient will be scheduled with [place] office 1/26.  Appreciate cardiology assistance    Discharge Diagnoses:  Principal Problem:   SOB (shortness of breath) Active Problems:   Hypertension   AKI (acute kidney injury) ([place])   Type 2 diabetes mellitus (HCC)   Seizures (HCC)   Acute respiratory failure with hypoxia (HCC)  Acute hypoxemic respiratory failure --per ED triage note, "Pt was found to be in 82%RA at office. EMS arrived and pt was on 2L at 96 %." --CTA chest showed Cardiomegaly with increased small moderate pericardial effusion, with no acute airspace disease.  Presentation most likely to be  related to CHF.  No evidence of PNA, no wheezing to suggest asthma exacerbation. -- Hypoxia likely secondary to decompensated heart failure Plan: Transition to p.o. Lasix.  Home DME oxygen ordered.  Discharge home with prescription for Lasix 20 mg daily.  Referral to CHF clinic.  Referral to [facility] cardiology   Acute combined CHF exacerbation -- Given grade 2 diastolic dysfunction suspect some prior diastolic dysfunction --started on IV lasix 20 mg BID initially -- Net -1.1 L since admission --Creatinine elevated, bicarbonate elevated Plan: Transition to low-dose p.o. Lasix.  Will discharge on 20 mg daily.  Added metoprolol succinate 25 mg daily.  Follow-up outpatient cardiology and CHF clinic   Acute kidney injury -- Baseline creatinine 1.  Elevated to 1.17, now resolved    Hypertension: No home blood pressure medications.   -- Added low-dose furosemide and metoprolol succinate to medication regimen   Diabetes mellitus type 2: --A1c 7.2. --no need for BG checks or SSI.   Labial pain Patient endorsing progressive pain from labia and small bump States that her outpatient providers are aware however I could not find documentation in care everywhere -[place] consultation Plan: Appreciate inpatient recommendations.  Outpatient GYN referral   Obesity BMI 50.2 This complicates overall care and prognosis RD consultation    Discharge Instructions  Discharge Instructions     (HEART FAILURE PATIENTS) Call MD:  Anytime you have any of the following symptoms: 1) 3 pound weight gain in 24 hours or 5 pounds in 1 week 2) shortness of breath, with or without a dry hacking cough 3) swelling in the hands, feet or stomach 4) if you have to sleep on extra pillows  at night in order to breathe.   Complete by: As directed    AMB referral to CHF clinic   Complete by: As directed    Ambulatory referral to Cardiology   Complete by: As directed    Diet - low sodium heart healthy   Complete  by: As directed    Increase activity slowly   Complete by: As directed       Allergies as of 10/17/2021       Reactions   Aspirin Nausea Only, Other (See Comments)   GI upset   Penicillins Rash        Medication List     TAKE these medications    albuterol 108 (90 Base) MCG/ACT inhaler Commonly known as: VENTOLIN HFA Inhale 2 puffs into the lungs every 6 (six) hours as needed for wheezing or shortness of breath.   furosemide 20 MG tablet Commonly known as: LASIX Take 1 tablet (20 mg total) by mouth daily. Start taking on: October 18, 2021   meclizine 12.5 MG tablet Commonly known as: ANTIVERT Take 1 tablet (12.5 mg total) by mouth 3 (three) times daily as needed for up to 30 doses for dizziness.   metoprolol succinate 25 MG 24 hr tablet Commonly known as: Toprol XL Take 1 tablet (25 mg total) by mouth daily.   ondansetron 4 MG tablet Commonly known as: ZOFRAN Take 1 tablet (4 mg total) by mouth every 6 (six) hours as needed for nausea.   pantoprazole 40 MG tablet Commonly known as: PROTONIX Take 1 tablet (40 mg total) by mouth daily. What changed: See the new instructions.   pravastatin 40 MG tablet Commonly known as: PRAVACHOL Take 1 tablet (40 mg total) by mouth daily.   triamcinolone ointment 0.1 % Commonly known as: KENALOG Apply 1 application topically 2 (two) times daily.   vitamin B-12 1000 MCG tablet Commonly known as: CYANOCOBALAMIN Take 1 tablet (1,000 mcg total) by mouth daily.   witch hazel-glycerin pad Commonly known as: TUCKS Apply topically as needed for itching.               Durable Medical Equipment  (From admission, onward)           Start     Ordered   10/16/21 1609  For home use only DME oxygen  Once       Question Answer Comment  Length of Need 12 Months   Mode or (Route) Nasal cannula   Liters per Minute 2   Frequency Continuous (stationary and portable oxygen unit needed)   Oxygen conserving device Yes    Oxygen delivery system Gas      10/16/21 1608   10/16/21 1609  For home use only DME Shower stool  Once        10/16/21 1608   10/16/21 1049  For home use only DME Walker rolling  Once       Question Answer Comment  Walker: With [place]   Patient needs a walker to treat with the following condition Weakness      10/16/21 1048            Follow-up Information     Wellington Hampshire, MD Follow up on 10/30/2021.   Specialty: Cardiology Why: 8:40 AM Contact information: [address] [place] [address] [phone]                Allergies  Allergen Reactions   Aspirin Nausea Only and Other (  See Comments)    GI upset   Penicillins Rash    Consultations: None   Procedures/Studies: DG Chest 2 View  Result Date: 10/13/2021 CLINICAL DATA:  Shortness of breath EXAM: CHEST - 2 VIEW COMPARISON:  06/13/2021 FINDINGS: Enlarged cardiac silhouette is again identified, which appears to be primarily due to prominent fat. Patchy density at the right lung base. No pleural effusion. No acute osseous abnormality. IMPRESSION: Patchy atelectasis/consolidation at the right lung base. Electronically Signed   By: Macy Mis M.D.   On: 10/13/2021 10:10   CT Angio Chest PE W and/or Wo Contrast  Result Date: 10/13/2021 CLINICAL DATA:  Hypoxic EXAM: CT ANGIOGRAPHY CHEST WITH CONTRAST TECHNIQUE: Multidetector CT imaging of the chest was performed using the standard protocol during bolus administration of intravenous contrast. Multiplanar CT image reconstructions and MIPs were obtained to evaluate the vascular anatomy. CONTRAST:  168mL OMNIPAQUE IOHEXOL 350 MG/ML SOLN COMPARISON:  Chest x-ray 10/13/2021, CT chest 06/13/2021 FINDINGS: Cardiovascular: Satisfactory opacification of the pulmonary arteries to the segmental level. No evidence of pulmonary embolism. Nonaneurysmal aorta. Mild atherosclerosis. No dissection. Cardiomegaly. Increased pericardial effusion, small  moderate measuring 15 mm maximum thickness left cardiac margin. Mediastinum/Nodes: Midline trachea. No thyroid mass. Enlarged right paratracheal lymph nodes measuring up to 17 mm without significant change. Small right hilar lymph nodes. Esophagus normal Lungs/Pleura: Mild subpleural reticulation and emphysema. No consolidation, pleural effusion or pneumothorax Upper Abdomen: No acute abnormality. Musculoskeletal: No chest wall abnormality. No acute or significant osseous findings. Review of the MIP images confirms the above findings. IMPRESSION: 1. Negative for acute pulmonary embolus or aortic dissection 2. Cardiomegaly with increased small moderate pericardial effusion 3. Mild subpleural reticulation consistent with chronic lung disease. No acute airspace disease 4. Similar mild mediastinal adenopathy Aortic Atherosclerosis (ICD10-I70.0) and Emphysema (ICD10-J43.9). Electronically Signed   By: Donavan Foil M.D.   On: 10/13/2021 19:49   ECHOCARDIOGRAM COMPLETE  Result Date: 10/14/2021    ECHOCARDIOGRAM REPORT   Patient Name:   HAZEL LEVEILLE Date of Exam: 10/14/2021 Medical Rec #:  [id number]      Height:       63.0 in Accession #:    [id number]     Weight:       270.0 lb Date of Birth:  1961-03-24      BSA:          2.197 m Patient Age:    20 years       BP:           165/93 mmHg Patient Gender: F              HR:           90 bpm. Exam Location:  [facility] Procedure: 2D Echo, Color Doppler and Cardiac Doppler Indications:     I50.21 congestive heart failure-Acute Systolic  History:         Patient has no prior history of Echocardiogram examinations.                  Signs/Symptoms:Shortness of Breath, Chest Pain, Edema and Back                  pain; Risk Factors:Hypertension, Diabetes and Sleep Apnea.  Sonographer:     Charmayne Sheer Referring Phys:  [place] Diagnosing Phys: Neoma Laming  Sonographer Comments: Suboptimal subcostal window. IMPRESSIONS  1. Left ventricular ejection fraction, by estimation,  is 45 to 50%. The left ventricle has mildly decreased function. The left  ventricle has no regional wall motion abnormalities. Left ventricular diastolic parameters are consistent with Grade III diastolic dysfunction (restrictive).  2. Right ventricular systolic function is moderately reduced. The right ventricular size is moderately enlarged.  3. Left atrial size was mildly dilated.  4. Right atrial size was mildly dilated.  5. The mitral valve is normal in structure. Trivial mitral valve regurgitation. No evidence of mitral stenosis.  6. The aortic valve is normal in structure. Aortic valve regurgitation is not visualized. No aortic stenosis is present.  7. The inferior vena cava is normal in size with greater than 50% respiratory variability, suggesting right atrial pressure of 3 mmHg. FINDINGS  Left Ventricle: Left ventricular ejection fraction, by estimation, is 45 to 50%. The left ventricle has mildly decreased function. The left ventricle has no regional wall motion abnormalities. The left ventricular internal cavity size was normal in size. There is no left ventricular hypertrophy. Left ventricular diastolic parameters are consistent with Grade III diastolic dysfunction (restrictive). Right Ventricle: The right ventricular size is moderately enlarged. No increase in right ventricular wall thickness. Right ventricular systolic function is moderately reduced. Left Atrium: Left atrial size was mildly dilated. Right Atrium: Right atrial size was mildly dilated. Pericardium: There is no evidence of pericardial effusion. Mitral Valve: The mitral valve is normal in structure. Trivial mitral valve regurgitation. No evidence of mitral valve stenosis. MV peak gradient, 4.1 mmHg. The mean mitral valve gradient is 2.0 mmHg. Tricuspid Valve: The tricuspid valve is normal in structure. Tricuspid valve regurgitation is trivial. No evidence of tricuspid stenosis. Aortic Valve: The aortic valve is normal in structure. Aortic  valve regurgitation is not visualized. No aortic stenosis is present. Aortic valve mean gradient measures 6.0 mmHg. Aortic valve peak gradient measures 11.2 mmHg. Aortic valve area, by VTI measures 2.15 cm. Pulmonic Valve: The pulmonic valve was normal in structure. Pulmonic valve regurgitation is not visualized. No evidence of pulmonic stenosis. Aorta: The aortic root is normal in size and structure. Venous: The inferior vena cava is normal in size with greater than 50% respiratory variability, suggesting right atrial pressure of 3 mmHg. IAS/Shunts: No atrial level shunt detected by color flow Doppler.  LEFT VENTRICLE PLAX 2D LVIDd:         4.61 cm   Diastology LVIDs:         3.55 cm   LV e' medial:    5.55 cm/s LV PW:         0.98 cm   LV E/e' medial:  14.1 LV IVS:        0.82 cm   LV e' lateral:   6.42 cm/s LVOT diam:     2.00 cm   LV E/e' lateral: 12.1 LV SV:         62 LV SV Index:   28 LVOT Area:     3.14 cm  RIGHT VENTRICLE RV Basal diam:  3.48 cm LEFT ATRIUM             Index        RIGHT ATRIUM           Index LA diam:        3.40 cm 1.55 cm/m   RA Area:     10.10 cm LA Vol (A2C):   42.1 ml 19.17 ml/m  RA Volume:   17.00 ml  7.74 ml/m LA Vol (A4C):   31.9 ml 14.52 ml/m LA Biplane Vol: 37.4 ml 17.03 ml/m  AORTIC VALVE  PULMONIC VALVE AV Area (Vmax):    2.16 cm      PV Vmax:       1.11 m/s AV Area (Vmean):   2.07 cm      PV Vmean:      76.300 cm/s AV Area (VTI):     2.15 cm      PV VTI:        0.188 m AV Vmax:           167.00 cm/s   PV Peak grad:  4.9 mmHg AV Vmean:          118.000 cm/s  PV Mean grad:  3.0 mmHg AV VTI:            0.288 m AV Peak Grad:      11.2 mmHg AV Mean Grad:      6.0 mmHg LVOT Vmax:         115.00 cm/s LVOT Vmean:        77.600 cm/s LVOT VTI:          0.197 m LVOT/AV VTI ratio: 0.68  AORTA Ao Root diam: 2.50 cm MITRAL VALVE MV Area (PHT): 4.60 cm     SHUNTS MV Area VTI:   3.19 cm     Systemic VTI:  0.20 m MV Peak grad:  4.1 mmHg     Systemic Diam: 2.00  cm MV Mean grad:  2.0 mmHg MV Vmax:       1.01 m/s MV Vmean:      67.1 cm/s MV Decel Time: 165 msec MV E velocity: 78.00 cm/s MV A velocity: 100.00 cm/s MV E/A ratio:  0.78 Shaukat Khan Electronically signed by Neoma Laming Signature Date/Time: 10/14/2021/11:41:37 AM    Final       Subjective: Patient seen and examined on day of discharge.  Stable no distress.  Respiratory status much improved.  Stable for discharge home.  Discharge Exam: Vitals:   10/17/21 0449 10/17/21 0745  BP: 123/83 139/70  Pulse: 97 98  Resp: 16 20  Temp: 98 F (36.7 C) 98.4 F (36.9 C)  SpO2: 99% 95%   Vitals:   10/16/21 2021 10/16/21 2136 10/17/21 0449 10/17/21 0745  BP: 112/71 (!) 142/87 123/83 139/70  Pulse: 63 97 97 98  Resp: 17 18 16 20   Temp: 98 F (36.7 C) 98.3 F (36.8 C) 98 F (36.7 C) 98.4 F (36.9 C)  TempSrc:    Oral  SpO2: 100% 99% 99% 95%  Weight:      Height:        General: Pt is alert, awake, not in acute distress Cardiovascular: RRR, S1/S2 +, no rubs, no gallops Respiratory: CTA bilaterally, no wheezing, no rhonchi Abdominal: Soft, NT, ND, bowel sounds + Extremities: no edema, no cyanosis    The results of significant diagnostics from this hospitalization (including imaging, microbiology, ancillary and laboratory) are listed below for reference.     Microbiology: Recent Results (from the past 240 hour(s))  Blood culture (routine x 2)     Status: None (Preliminary result)   Collection Time: 10/13/21  3:31 PM   Specimen: BLOOD  Result Value Ref Range Status   Specimen Description BLOOD RIGHT ANTECUBITAL  Final   Special Requests   Final    BOTTLES DRAWN AEROBIC AND ANAEROBIC Blood Culture adequate volume   Culture   Final    NO GROWTH 4 DAYS Performed at [facility], [address], [place], [place] [address]    Report Status  PENDING  Incomplete  Blood culture (routine x 2)     Status: None (Preliminary result)   Collection Time: 10/13/21  3:31 PM    Specimen: BLOOD  Result Value Ref Range Status   Specimen Description BLOOD LEFT ANTECUBITAL  Final   Special Requests   Final    BOTTLES DRAWN AEROBIC AND ANAEROBIC Blood Culture adequate volume   Culture   Final    NO GROWTH 4 DAYS Performed at [facility], [address] [place], [place] [address]    Report Status PENDING  Incomplete  Resp Panel by RT-PCR (Flu A&B, Covid) Nasopharyngeal Swab     Status: None   Collection Time: 10/13/21  4:31 PM   Specimen: Nasopharyngeal Swab; Nasopharyngeal(NP) swabs in vial transport medium  Result Value Ref Range Status   SARS Coronavirus 2 by RT PCR NEGATIVE NEGATIVE Final    Comment: (NOTE) SARS-CoV-2 target nucleic acids are NOT DETECTED.  The SARS-CoV-2 RNA is generally detectable in upper respiratory specimens during the acute phase of infection. The lowest concentration of SARS-CoV-2 viral copies this assay can detect is 138 copies/mL. A negative result does not preclude SARS-Cov-2 infection and should not be used as the sole basis for treatment or other patient management decisions. A negative result may occur with  improper specimen collection/handling, submission of specimen other than nasopharyngeal swab, presence of viral mutation(s) within the areas targeted by this assay, and inadequate number of viral copies(<138 copies/mL). A negative result must be combined with clinical observations, patient history, and epidemiological information. The expected result is Negative.  Fact Sheet for Patients:  EntrepreneurPulse.com.au  Fact Sheet for Healthcare Providers:  IncredibleEmployment.be  This test is no t yet approved or cleared by the [place] FDA and  has been authorized for detection and/or diagnosis of SARS-CoV-2 by FDA under an Emergency Use Authorization (EUA). This EUA will remain  in effect (meaning this test can be used) for the duration of the COVID-19 declaration  under Section 564(b)(1) of the Act, 21 U.S.C.section 360bbb-3(b)(1), unless the authorization is terminated  or revoked sooner.       Influenza A by PCR NEGATIVE NEGATIVE Final   Influenza B by PCR NEGATIVE NEGATIVE Final    Comment: (NOTE) The Xpert Xpress SARS-CoV-2/FLU/RSV plus assay is intended as an aid in the diagnosis of influenza from Nasopharyngeal swab specimens and should not be used as a sole basis for treatment. Nasal washings and aspirates are unacceptable for Xpert Xpress SARS-CoV-2/FLU/RSV testing.  Fact Sheet for Patients: EntrepreneurPulse.com.au  Fact Sheet for Healthcare Providers: IncredibleEmployment.be  This test is not yet approved or cleared by the [place] FDA and has been authorized for detection and/or diagnosis of SARS-CoV-2 by FDA under an Emergency Use Authorization (EUA). This EUA will remain in effect (meaning this test can be used) for the duration of the COVID-19 declaration under Section 564(b)(1) of the Act, 21 U.S.C. section 360bbb-3(b)(1), unless the authorization is terminated or revoked.  Performed at [facility], [address] [place], [place] [address]      Labs: BNP (last 3 results) Recent Labs    06/13/21 1643 10/13/21 1013  BNP 7.5 8.4   Basic Metabolic Panel: Recent Labs  Lab 10/13/21 1013 10/14/21 0631 10/15/21 0422 10/16/21 0434 10/17/21 0435  NA 139 140 137 137 136  K 3.8 4.2 4.0 4.0 4.1  CL 95* 96* 91* 92* 94*  CO2 35* 36* 39* 35* 31  GLUCOSE 127* 148* 133* 139* 139*  BUN  22 16 19 18  27*  CREATININE 1.15* 0.99 1.13* 0.99 1.18*  CALCIUM 8.6* 8.3* 8.5* 8.7* 9.5  MG  --   --  1.9 2.3 2.2   Liver Function Tests: Recent Labs  Lab 10/13/21 1013  AST 18  ALT 11  ALKPHOS 59  BILITOT 0.5  PROT 7.3  ALBUMIN 3.9   No results for input(s): LIPASE, AMYLASE in the last 168 hours. No results for input(s): AMMONIA in the last 168 hours. CBC: Recent Labs   Lab 10/13/21 1013 10/14/21 0631 10/15/21 0422 10/16/21 0434 10/17/21 0435  WBC 6.9 5.6 6.3 5.9 6.5  NEUTROABS  --  3.2  --   --   --   HGB 14.2 14.2 14.1 14.6 16.0*  HCT 44.3 44.5 44.6 45.6 49.7*  MCV 107.3* 108.0* 107.7* 107.5* 107.1*  PLT 216 221 222 217 217   Cardiac Enzymes: No results for input(s): CKTOTAL, CKMB, CKMBINDEX, TROPONINI in the last 168 hours. BNP: Invalid input(s): POCBNP CBG: Recent Labs  Lab 10/15/21 0745 10/15/21 1243  GLUCAP 149* 148*   D-Dimer No results for input(s): DDIMER in the last 72 hours. Hgb A1c No results for input(s): HGBA1C in the last 72 hours. Lipid Profile No results for input(s): CHOL, HDL, LDLCALC, TRIG, CHOLHDL, LDLDIRECT in the last 72 hours. Thyroid function studies No results for input(s): TSH, T4TOTAL, T3FREE, THYROIDAB in the last 72 hours.  Invalid input(s): FREET3 Anemia work up No results for input(s): VITAMINB12, FOLATE, FERRITIN, TIBC, IRON, RETICCTPCT in the last 72 hours. Urinalysis    Component Value Date/Time   COLORURINE YELLOW 10/26/2010 1514   APPEARANCEUR CLOUDY (A) 10/26/2010 1514   LABSPEC 1.020 10/26/2010 1514   PHURINE 5.5 10/26/2010 1514   HGBUR SMALL (A) 10/26/2010 1514   BILIRUBINUR NEGATIVE 10/26/2010 1514   KETONESUR NEGATIVE 10/26/2010 1514   PROTEINUR NEGATIVE 10/26/2010 1514   UROBILINOGEN 0.2 10/26/2010 1514   NITRITE NEGATIVE 10/26/2010 1514   LEUKOCYTESUR NEGATIVE 10/26/2010 1514   Sepsis Labs Invalid input(s): PROCALCITONIN,  WBC,  LACTICIDVEN Microbiology Recent Results (from the past 240 hour(s))  Blood culture (routine x 2)     Status: None (Preliminary result)   Collection Time: 10/13/21  3:31 PM   Specimen: BLOOD  Result Value Ref Range Status   Specimen Description BLOOD RIGHT ANTECUBITAL  Final   Special Requests   Final    BOTTLES DRAWN AEROBIC AND ANAEROBIC Blood Culture adequate volume   Culture   Final    NO GROWTH 4 DAYS Performed at [facility], [address], [place], [place] [address]    Report Status PENDING  Incomplete  Blood culture (routine x 2)     Status: None (Preliminary result)   Collection Time: 10/13/21  3:31 PM   Specimen: BLOOD  Result Value Ref Range Status   Specimen Description BLOOD LEFT ANTECUBITAL  Final   Special Requests   Final    BOTTLES DRAWN AEROBIC AND ANAEROBIC Blood Culture adequate volume   Culture   Final    NO GROWTH 4 DAYS Performed at [facility], [address], [place],  [address]    Report Status PENDING  Incomplete  Resp Panel by RT-PCR (Flu A&B, Covid) Nasopharyngeal Swab     Status: None   Collection Time: 10/13/21  4:31 PM   Specimen: Nasopharyngeal Swab; Nasopharyngeal(NP) swabs in vial transport medium  Result Value Ref Range Status   SARS Coronavirus 2 by RT PCR NEGATIVE NEGATIVE Final    Comment: (NOTE)  SARS-CoV-2 target nucleic acids are NOT DETECTED.  The SARS-CoV-2 RNA is generally detectable in upper respiratory specimens during the acute phase of infection. The lowest concentration of SARS-CoV-2 viral copies this assay can detect is 138 copies/mL. A negative result does not preclude SARS-Cov-2 infection and should not be used as the sole basis for treatment or other patient management decisions. A negative result may occur with  improper specimen collection/handling, submission of specimen other than nasopharyngeal swab, presence of viral mutation(s) within the areas targeted by this assay, and inadequate number of viral copies(<138 copies/mL). A negative result must be combined with clinical observations, patient history, and epidemiological information. The expected result is Negative.  Fact Sheet for Patients:  EntrepreneurPulse.com.au  Fact Sheet for Healthcare Providers:  IncredibleEmployment.be  This test is no t yet approved or cleared by the [place] FDA and  has been authorized for detection and/or  diagnosis of SARS-CoV-2 by FDA under an Emergency Use Authorization (EUA). This EUA will remain  in effect (meaning this test can be used) for the duration of the COVID-19 declaration under Section 564(b)(1) of the Act, 21 U.S.C.section 360bbb-3(b)(1), unless the authorization is terminated  or revoked sooner.       Influenza A by PCR NEGATIVE NEGATIVE Final   Influenza B by PCR NEGATIVE NEGATIVE Final    Comment: (NOTE) The Xpert Xpress SARS-CoV-2/FLU/RSV plus assay is intended as an aid in the diagnosis of influenza from Nasopharyngeal swab specimens and should not be used as a sole basis for treatment. Nasal washings and aspirates are unacceptable for Xpert Xpress SARS-CoV-2/FLU/RSV testing.  Fact Sheet for Patients: EntrepreneurPulse.com.au  Fact Sheet for Healthcare Providers: IncredibleEmployment.be  This test is not yet approved or cleared by the [place] FDA and has been authorized for detection and/or diagnosis of SARS-CoV-2 by FDA under an Emergency Use Authorization (EUA). This EUA will remain in effect (meaning this test can be used) for the duration of the COVID-19 declaration under Section 564(b)(1) of the Act, 21 U.S.C. section 360bbb-3(b)(1), unless the authorization is terminated or revoked.  Performed at [facility], [address], [place], [place] [address]      Time coordinating discharge: Over 30 minutes  SIGNED:   Sidney Ace, MD  [facility] 10/17/2021, 11:25 AM Pager   If 7PM-7AM, please contact night-coverage

## 2021-10-17 NOTE — Progress Notes (Addendum)
Occupational Therapy * Physical Therapy * Speech Therapy        Date: 10/17/21 Patient Name: Jaime Fitzgerald Patient MRN: [id number] Diagnosis: R06.02 Date of Discharge: 10/17/21 PCP: [facility] [phone]         Dear Provider Name: [facility] Fax: [phone]  I certify that I have examined this patient and that occupational/physical/speech therapy is necessary on an outpatient basis.    The patient has expressed interest in completing their recommended course of therapy at your location.  Once a formal order from the patient's primary care physician has been obtained, please contact him/her to schedule an appointment for evaluation at your earliest convenience.   [ X ]  Physical Therapy Evaluate and Treat                   The patient's primary care physician (listed above) must furnish and be responsible for a formal order such that the recommended services may be furnished while under the primary physician's care, and that the plan of care will be established and reviewed every 30 days (or more often if condition necessitates).

## 2021-10-17 NOTE — TOC Progression Note (Addendum)
Transition of Care [facility]) - Progression Note    Patient Details  Name: Jaime Fitzgerald MRN: [id number] Date of Birth: [birth year]  Transition of Care [facility]) CM/SW [place], LCSW Phone Number: 10/17/2021, 8:54 AM  Clinical Narrative:    Reached out to [place] with [place] with [facility] to see if they could accept patient for [facility]. Both stated they are unable to take patient due to insurance and staffing.  Spoke to patient who is agreeable to OPPT referral. No agency preference. Referral being faxed to [facility]. Patient says her friend Sonia Side can set up [facility] to transport her to [place]. She asked that I update Sonia Side. I attempted to call Sonia Side at both numbers listed, one number didn't work and one the VM was full.   Expected Discharge Plan: [place] Barriers to Discharge: Continued Medical Work up  Expected Discharge Plan and Services Expected Discharge Plan: [place] Choice: Home Health, Durable Medical Equipment Living arrangements for the past 2 months: Single Family Home                                       Social Determinants of Health (SDOH) Interventions    Readmission Risk Interventions No flowsheet data found.

## 2021-10-18 LAB — CULTURE, BLOOD (ROUTINE X 2)
Culture: NO GROWTH
Culture: NO GROWTH
Special Requests: ADEQUATE
Special Requests: ADEQUATE

## 2021-10-30 ENCOUNTER — Encounter: Payer: Self-pay | Admitting: Cardiovascular Disease

## 2021-10-30 ENCOUNTER — Ambulatory Visit (INDEPENDENT_AMBULATORY_CARE_PROVIDER_SITE_OTHER): Payer: Medicaid Other | Admitting: Cardiovascular Disease

## 2021-10-30 ENCOUNTER — Other Ambulatory Visit: Payer: Self-pay

## 2021-10-30 VITALS — BP 112/60 | HR 98 | Ht 63.0 in | Wt 215.0 lb

## 2021-10-30 DIAGNOSIS — I5032 Chronic diastolic (congestive) heart failure: Secondary | ICD-10-CM

## 2021-10-30 DIAGNOSIS — I3139 Other pericardial effusion (noninflammatory): Secondary | ICD-10-CM | POA: Diagnosis not present

## 2021-10-30 DIAGNOSIS — I10 Essential (primary) hypertension: Secondary | ICD-10-CM

## 2021-10-30 DIAGNOSIS — E785 Hyperlipidemia, unspecified: Secondary | ICD-10-CM | POA: Diagnosis not present

## 2021-10-30 MED ORDER — AMLODIPINE BESYLATE 5 MG PO TABS: 5.0000 mg | ORAL_TABLET | Freq: Every day | ORAL | 1 refills | Status: DC

## 2021-10-30 MED ORDER — FUROSEMIDE 40 MG PO TABS: 40.0000 mg | ORAL_TABLET | Freq: Every day | ORAL | 1 refills | Status: DC

## 2021-10-30 NOTE — Progress Notes (Signed)
Cardiology Office Note   Date:  10/30/2021   ID:  Jaime Fitzgerald, DOB 21-Sep-1961, MRN [id number]  PCP:  [facility] [place]  Cardiologist:   Kathlyn Sacramento, MD   Chief Complaint  Patient presents with   Other    CHF no complaints today. Meds reviewed verbally with pt.      History of Present Illness: Jaime Fitzgerald is a 61 y.o. female who was referred from [facility] for evaluation of heart failure mildly reduced ejection fraction.  She has known history of essential hypertension, previous GI bleed with blood loss anemia, asthma/COPD, tobacco use, type 2 diabetes, sleep apnea and obesity. She was hospitalized recently at [facility] with increased shortness of breath which started after a cold and upper respiratory tract infection.  CTA showed cardiomegaly with small pericardial effusion.  She had an echocardiogram done while hospitalized which showed an EF of 45 to 50% with grade 3 diastolic dysfunction.  The patient improved with diuresis but continued to be hypoxic and thus she was discharged home on 2 L of oxygen. I personally reviewed her echocardiogram images disagree with the echo interpretation.  Ejection fraction appeared normal with mild LVH, moderate size pericardial effusion and grade 1 diastolic dysfunction.  Pulmonary pressure could not be estimated.  The patient has gradual weight gain since 2021 her weight was 179 pounds.  She reports progressive leg edema and abdominal swelling since then and her weight increased to a peak of 260 pounds.  With diuresis, her weight is down to 215 pounds today.  She does complain of some minor chest discomfort described as continued congestion.   Past Medical History:  Diagnosis Date   Acute blood loss anemia 10/11/2019   Acute GI bleeding 10/11/2019   Anginal pain (HCC)    Arthritis    Asthma    Diabetes mellitus without complication (HCC)    Dyspnea    Hypertension    Seizures ([place])    Sleep apnea     Past Surgical History:   Procedure Laterality Date   CARPAL TUNNEL RELEASE     COLONOSCOPY WITH PROPOFOL N/A 01/17/2020   Procedure: COLONOSCOPY WITH PROPOFOL;  Surgeon: Lin Landsman, MD;  Location: [facility] ENDOSCOPY;  Service: Gastroenterology;  Laterality: N/A;   ESOPHAGOGASTRODUODENOSCOPY N/A 10/13/2019   Procedure: ESOPHAGOGASTRODUODENOSCOPY (EGD);  Surgeon: Virgel Manifold, MD;  Location: [facility] ENDOSCOPY;  Service: Endoscopy;  Laterality: N/A;   ESOPHAGOGASTRODUODENOSCOPY N/A 09/18/2020   Procedure: ESOPHAGOGASTRODUODENOSCOPY (EGD);  Surgeon: Toledo, Benay Pike, MD;  Location: [facility] ENDOSCOPY;  Service: Gastroenterology;  Laterality: N/A;   ESOPHAGOGASTRODUODENOSCOPY (EGD) WITH PROPOFOL N/A 10/11/2019   Procedure: ESOPHAGOGASTRODUODENOSCOPY (EGD) WITH PROPOFOL;  Surgeon: Virgel Manifold, MD;  Location: [facility] ENDOSCOPY;  Service: Endoscopy;  Laterality: N/A;   ESOPHAGOGASTRODUODENOSCOPY (EGD) WITH PROPOFOL N/A 01/17/2020   Procedure: ESOPHAGOGASTRODUODENOSCOPY (EGD) WITH PROPOFOL;  Surgeon: Lin Landsman, MD;  Location: [facility] ENDOSCOPY;  Service: Gastroenterology;  Laterality: N/A;   TONSILLECTOMY       Current Outpatient Medications  Medication Sig Dispense Refill   albuterol (VENTOLIN HFA) 108 (90 Base) MCG/ACT inhaler Inhale 2 puffs into the lungs every 6 (six) hours as needed for wheezing or shortness of breath.     amLODipine (NORVASC) 10 MG tablet Take 10 mg by mouth daily.     cyclobenzaprine (FLEXERIL) 10 MG tablet Take 10 mg by mouth as needed for muscle spasms.     Fluticasone Propionate, Inhal, (FLOVENT IN) Inhale into the lungs as needed.  furosemide (LASIX) 20 MG tablet Take 1 tablet (20 mg total) by mouth daily. 30 tablet 0   glipiZIDE (GLUCOTROL XL) 10 MG 24 hr tablet Take 10 mg by mouth 2 (two) times daily.     meclizine (ANTIVERT) 12.5 MG tablet Take 1 tablet (12.5 mg total) by mouth 3 (three) times daily as needed for up to 30 doses for dizziness. 30 tablet 0   metoprolol  succinate (TOPROL XL) 25 MG 24 hr tablet Take 1 tablet (25 mg total) by mouth daily. 30 tablet 0   ondansetron (ZOFRAN) 4 MG tablet Take 1 tablet (4 mg total) by mouth every 6 (six) hours as needed for nausea. 20 tablet 0   pantoprazole (PROTONIX) 40 MG tablet Take 1 tablet (40 mg total) by mouth daily. 30 tablet 0   pravastatin (PRAVACHOL) 40 MG tablet Take 1 tablet (40 mg total) by mouth daily. 30 tablet 0   triamcinolone ointment (KENALOG) 0.1 % Apply 1 application topically 2 (two) times daily.     vitamin B-12 (CYANOCOBALAMIN) 1000 MCG tablet Take 1 tablet (1,000 mcg total) by mouth daily. 90 tablet 1   witch hazel-glycerin (TUCKS) pad Apply topically as needed for itching. 40 each 0   No current facility-administered medications for this visit.    Allergies:   Aspirin and Penicillins    Social History:  The patient  reports that she has quit smoking. Her smoking use included cigarettes. She has never used smokeless tobacco. She reports that she does not currently use alcohol. She reports that she does not currently use drugs.   Family History:  The patient's family history includes Heart Problems in her mother; Seizures in her father.    ROS:  Please see the history of present illness.   Otherwise, review of systems are positive for none.   All other systems are reviewed and negative.    PHYSICAL EXAM: VS:  BP 112/60 (BP Location: Right Arm, Patient Position: Sitting, Cuff Size: Normal)    Pulse 98    Ht 5\' 3"  (1.6 m)    Wt 215 lb (97.5 kg)    SpO2 95%    BMI 38.09 kg/m  , BMI Body mass index is 38.09 kg/m. GEN: Well nourished, well developed, in no acute distress  HEENT: normal  Neck: no carotid bruits, or masses.  Jugular venous pressure is not well visualized Cardiac: RRR; no murmurs, rubs, or gallops, mild bilateral leg edema Respiratory:  clear to auscultation bilaterally, normal work of breathing.  Decreased sounds at the base GI: soft, nontender, + BS.  Abdomen is somewhat  distended MS: no deformity or atrophy  Skin: warm and dry, no rash Neuro:  Strength and sensation are intact Psych: euthymic mood, full affect   EKG:  EKG is notordered today. Recent EKGs reviewed and showed sinus rhythm with low voltage and possible prior septal infarct.   Recent Labs: 10/13/2021: ALT 11; B Natriuretic Peptide 8.4 10/14/2021: TSH 1.955 10/17/2021: BUN 27; Creatinine, Ser 1.18; Hemoglobin 16.0; Magnesium 2.2; Platelets 217; Potassium 4.1; Sodium 136    Lipid Panel No results found for: CHOL, TRIG, HDL, CHOLHDL, VLDL, LDLCALC, LDLDIRECT    Wt Readings from Last 3 Encounters:  10/30/21 215 lb (97.5 kg)  10/14/21 270 lb 8.1 oz (122.7 kg)  06/13/21 270 lb (122.5 kg)      PAD Screen 10/30/2021  Previous PAD dx? Yes  Previous surgical procedure? No  Pain with walking? Yes  Subsides with rest? Yes  Feet/toe relief with  dangling? No  Painful, non-healing ulcers? No  Extremities discolored? No      ASSESSMENT AND PLAN:  1.  Chronic diastolic heart failure: The patient's presentation seems to be consistent with diastolic heart failure as her ejection fraction appears normal.  She has evidence of massive weight increase over 1 to 2 years with mostly right-sided volume overload.  She seems to have improved with diuresis but continues to be volume overloaded.  I increased furosemide to 40 mg once daily.  Check basic metabolic profile in 1 week.  Given the presence of pericardial effusion, I am going to repeat a limited echocardiogram to ensure stability.  Ultimately, the patient might need to undergo right and left cardiac catheterization.  We also have to think about cardiac amyloidosis given her presentation and EKG pattern.  2.  Moderate size pericardial effusion: Repeat limited echocardiogram to ensure stability.  3.  Essential hypertension: Blood pressure is controlled.  I decreased amlodipine to 5 mg daily given lower extremity edema.  4.  Hyperlipidemia: Continue  pravastatin for now but if she has significant underlying coronary artery disease, she will need a more potent statin.    Disposition: Proceed with limited echocardiogram and follow-up in 1 month.  Signed,  Kathlyn Sacramento, MD  10/30/2021 8:28 AM    [facility]

## 2021-10-30 NOTE — Patient Instructions (Signed)
Medication Instructions:  Your physician has recommended you make the following change in your medication:   INCREASE Furosemide (Lasix) to 40 mg daily. An Rx has been sent to your pharmacy  DECREASE Amlodipine to 5 mg daily. An Rx has been sent to your pharmacy  *If you need a refill on your cardiac medications before your next appointment, please call your pharmacy*   Lab Work: Your physician recommends that you return for lab work (bmp) in: 1 week  If you have labs (blood work) drawn today and your tests are completely normal, you will receive your results only by: MyChart Message (if you have MyChart) OR A paper copy in the mail If you have any lab test that is abnormal or we need to change your treatment, we will call you to review the results.   Testing/Procedures: Your physician has requested that you have a limited echocardiogram. Echocardiography is a painless test that uses sound waves to create images of your heart. It provides your doctor with information about the size and shape of your heart and how well your hearts chambers and valves are working. This procedure takes approximately one hour. There are no restrictions for this procedure.    Follow-Up: At [facility], you and your health needs are our priority.  As part of our continuing mission to provide you with exceptional heart care, we have created designated Provider Care Teams.  These Care Teams include your primary Cardiologist (physician) and Advanced Practice Providers (APPs -  Physician Assistants and Nurse Practitioners) who all work together to provide you with the care you need, when you need it.  We recommend signing up for the patient portal called "MyChart".  Sign up information is provided on this After Visit Summary.  MyChart is used to connect with patients for Virtual Visits (Telemedicine).  Patients are able to view lab/test results, encounter notes, upcoming appointments, etc.  Non-urgent messages  can be sent to your provider as well.   To learn more about what you can do with MyChart, go to [email or web address].    Your next appointment:   4 week(s)  The format for your next appointment:   In Person  Provider:   You may see Kathlyn Sacramento, MD or one of the following Advanced Practice Providers on your designated Care Team:   Murray Hodgkins, NP Christell Faith, PA-C Cadence Kathlen Mody, PA-C{    Other Instructions N/A

## 2021-11-04 ENCOUNTER — Ambulatory Visit: Payer: Medicaid Other | Admitting: Family

## 2021-11-06 ENCOUNTER — Ambulatory Visit: Payer: Medicaid Other

## 2021-11-06 ENCOUNTER — Other Ambulatory Visit: Payer: Medicaid Other

## 2021-11-12 ENCOUNTER — Ambulatory Visit: Payer: Medicaid Other | Admitting: Family

## 2021-11-25 ENCOUNTER — Encounter: Payer: Self-pay | Admitting: Family

## 2021-11-25 ENCOUNTER — Telehealth: Payer: Self-pay | Admitting: Family

## 2021-11-25 ENCOUNTER — Other Ambulatory Visit
Admission: RE | Admit: 2021-11-25 | Discharge: 2021-11-25 | Disposition: A | Discharge status: Home / Self Care | Payer: Medicaid Other | Source: Ambulatory Visit | Attending: Family | Admitting: Family

## 2021-11-25 ENCOUNTER — Other Ambulatory Visit: Payer: Self-pay

## 2021-11-25 ENCOUNTER — Ambulatory Visit (HOSPITAL_BASED_OUTPATIENT_CLINIC_OR_DEPARTMENT_OTHER): Payer: Medicaid Other | Admitting: Family

## 2021-11-25 VITALS — BP 127/72 | HR 84 | Resp 20 | Ht 63.0 in | Wt 212.3 lb

## 2021-11-25 DIAGNOSIS — R0600 Dyspnea, unspecified: Secondary | ICD-10-CM | POA: Diagnosis not present

## 2021-11-25 DIAGNOSIS — J45909 Unspecified asthma, uncomplicated: Secondary | ICD-10-CM | POA: Diagnosis not present

## 2021-11-25 DIAGNOSIS — Z720 Tobacco use: Secondary | ICD-10-CM | POA: Diagnosis not present

## 2021-11-25 DIAGNOSIS — R252 Cramp and spasm: Secondary | ICD-10-CM | POA: Diagnosis not present

## 2021-11-25 DIAGNOSIS — I110 Hypertensive heart disease with heart failure: Secondary | ICD-10-CM | POA: Insufficient documentation

## 2021-11-25 DIAGNOSIS — I5032 Chronic diastolic (congestive) heart failure: Secondary | ICD-10-CM | POA: Diagnosis present

## 2021-11-25 DIAGNOSIS — G4730 Sleep apnea, unspecified: Secondary | ICD-10-CM | POA: Diagnosis not present

## 2021-11-25 DIAGNOSIS — E119 Type 2 diabetes mellitus without complications: Secondary | ICD-10-CM

## 2021-11-25 DIAGNOSIS — K922 Gastrointestinal hemorrhage, unspecified: Secondary | ICD-10-CM | POA: Diagnosis not present

## 2021-11-25 DIAGNOSIS — F17210 Nicotine dependence, cigarettes, uncomplicated: Secondary | ICD-10-CM | POA: Insufficient documentation

## 2021-11-25 DIAGNOSIS — R569 Unspecified convulsions: Secondary | ICD-10-CM | POA: Diagnosis not present

## 2021-11-25 DIAGNOSIS — E669 Obesity, unspecified: Secondary | ICD-10-CM | POA: Diagnosis not present

## 2021-11-25 DIAGNOSIS — I10 Essential (primary) hypertension: Secondary | ICD-10-CM

## 2021-11-25 DIAGNOSIS — D62 Acute posthemorrhagic anemia: Secondary | ICD-10-CM | POA: Diagnosis not present

## 2021-11-25 LAB — BASIC METABOLIC PANEL
Anion gap: 10 (ref 5–15)
BUN: 19 mg/dL (ref 8–23)
CO2: 32 mmol/L (ref 22–32)
Calcium: 8.7 mg/dL — ABNORMAL LOW (ref 8.9–10.3)
Chloride: 94 mmol/L — ABNORMAL LOW (ref 98–111)
Creatinine, Ser: 1.05 mg/dL — ABNORMAL HIGH (ref 0.44–1.00)
GFR, Estimated: 60 mL/min — ABNORMAL LOW (ref >60)
Glucose, Bld: 131 mg/dL — ABNORMAL HIGH (ref 70–99)
Potassium: 3.8 mmol/L (ref 3.5–5.1)
Sodium: 136 mmol/L (ref 135–145)

## 2021-11-25 NOTE — Patient Instructions (Signed)
Start weighing your self every morning.  If you have a weight gain of 3 lbs overnight, call us.  Keep your sodium intake < 2 g /day.  Adhere to 64 ounces of water/day.  Return in 1 month.

## 2021-11-25 NOTE — Progress Notes (Signed)
Name: Jaime Fitzgerald   DOB: 08/18/1961  MRN: [id number]  Jaime Fitzgerald is a 60 year old female with a past medical history of hypertension, acute blood loss anemia with history of GI bleed, asthma, diabetes mellitus type 2, seizures, sleep apnea, dyspnea, obesity.  Echo report from 10/14/21 reviewed and showed: EF of 45-50% with moderate LV dysfunction, GIII restrictive. Repeat echo scheduled on 11/26/21 by Dr. Fletcher Anon as his review of the echo did not match the above interpretation.   Recently admitted 10/13/21-10/17/21 with SOB, acute hypoxic respiratory failure, acute combined HF exacerbation and AKI.  She presents today for an initial visit to the Heart Failure clinic with a chief complaint of SOB and fatigue. She reports these symptoms have been present for some time and worsened leading up to her hospitalization. Since her discharge, she was started on diuretics and feels that her SOB is somewhat better. She denies CP, palpitations, orthopnea, dizziness, weakness, cough, or pedal edema.  Past Medical History:  Diagnosis Date   Acute blood loss anemia 10/11/2019   Acute GI bleeding 10/11/2019   Anginal pain (HCC)    Arthritis    Asthma    Diabetes mellitus without complication (HCC)    Dyspnea    Hypertension    Seizures ([place])    Sleep apnea    Past Surgical History:  Procedure Laterality Date   CARPAL TUNNEL RELEASE     COLONOSCOPY WITH PROPOFOL N/A 01/17/2020   Procedure: COLONOSCOPY WITH PROPOFOL;  Surgeon: Lin Landsman, MD;  Location: [facility] ENDOSCOPY;  Service: Gastroenterology;  Laterality: N/A;   ESOPHAGOGASTRODUODENOSCOPY N/A 10/13/2019   Procedure: ESOPHAGOGASTRODUODENOSCOPY (EGD);  Surgeon: Virgel Manifold, MD;  Location: [facility] ENDOSCOPY;  Service: Endoscopy;  Laterality: N/A;   ESOPHAGOGASTRODUODENOSCOPY N/A 09/18/2020   Procedure: ESOPHAGOGASTRODUODENOSCOPY (EGD);  Surgeon: Toledo, Benay Pike, MD;  Location: [facility] ENDOSCOPY;  Service: Gastroenterology;  Laterality: N/A;    ESOPHAGOGASTRODUODENOSCOPY (EGD) WITH PROPOFOL N/A 10/11/2019   Procedure: ESOPHAGOGASTRODUODENOSCOPY (EGD) WITH PROPOFOL;  Surgeon: Virgel Manifold, MD;  Location: [facility] ENDOSCOPY;  Service: Endoscopy;  Laterality: N/A;   ESOPHAGOGASTRODUODENOSCOPY (EGD) WITH PROPOFOL N/A 01/17/2020   Procedure: ESOPHAGOGASTRODUODENOSCOPY (EGD) WITH PROPOFOL;  Surgeon: Lin Landsman, MD;  Location: [facility] ENDOSCOPY;  Service: Gastroenterology;  Laterality: N/A;   TONSILLECTOMY     Family History  Problem Relation Age of Onset   Heart Problems Mother    Seizures Father    Social History   Tobacco Use   Smoking status: Former    Types: Cigarettes   Smokeless tobacco: Never  Substance Use Topics   Alcohol use: Not Currently   Allergies  Allergen Reactions   Aspirin Nausea Only and Other (See Comments)    GI upset   Penicillins Rash   Prior to Admission medications   Medication Sig Start Date End Date Taking? Authorizing Provider  amLODipine (NORVASC) 5 MG tablet Take 1 tablet (5 mg total) by mouth daily. 10/30/21  Yes Wellington Hampshire, MD  cyclobenzaprine (FLEXERIL) 10 MG tablet Take 10 mg by mouth as needed for muscle spasms.   Yes [provider]  furosemide (LASIX) 40 MG tablet Take 1 tablet (40 mg total) by mouth daily. 10/30/21  Yes Wellington Hampshire, MD  glipiZIDE (GLUCOTROL XL) 10 MG 24 hr tablet Take 10 mg by mouth 2 (two) times daily. 10/27/21  Yes [provider]  meclizine (ANTIVERT) 12.5 MG tablet Take 1 tablet (12.5 mg total) by mouth 3 (three) times daily as needed for up to 30 doses  for dizziness. 09/20/20  Yes Nicole Kindred A, DO  metoprolol succinate (TOPROL XL) 25 MG 24 hr tablet Take 1 tablet (25 mg total) by mouth daily. 10/17/21  Yes Sreenath, Sudheer B, MD  pravastatin (PRAVACHOL) 40 MG tablet Take 1 tablet (40 mg total) by mouth daily. 10/17/21  Yes Sreenath, Sudheer B, MD  vitamin B-12 (CYANOCOBALAMIN) 1000 MCG tablet Take 1 tablet (1,000 mcg total) by  mouth daily. 08/05/21  Yes Vanga, Tally Due, MD  witch hazel-glycerin (TUCKS) pad Apply topically as needed for itching. 10/17/21  Yes Sreenath, Sudheer B, MD  albuterol (VENTOLIN HFA) 108 (90 Base) MCG/ACT inhaler Inhale 2 puffs into the lungs every 6 (six) hours as needed for wheezing or shortness of breath. 10/14/19   Nolberto Hanlon, MD  Fluticasone Propionate, Inhal, (FLOVENT IN) Inhale into the lungs as needed.    [provider]  ondansetron (ZOFRAN) 4 MG tablet Take 1 tablet (4 mg total) by mouth every 6 (six) hours as needed for nausea. Patient not taking: Reported on 11/25/2021 10/17/21   Sidney Ace, MD  pantoprazole (PROTONIX) 40 MG tablet Take 1 tablet (40 mg total) by mouth daily. Patient not taking: Reported on 11/25/2021 10/17/21 11/16/21  Sidney Ace, MD   Review of Systems  Constitutional:  Positive for malaise/fatigue.  HENT: Negative.    Eyes:  Negative for blurred vision and double vision.  Respiratory:  Negative for cough and wheezing.   Cardiovascular:  Negative for chest pain, palpitations, orthopnea and leg swelling.  Gastrointestinal: Negative.   Genitourinary: Negative.   Musculoskeletal:  Positive for back pain.  Skin: Negative.   Neurological:  Positive for headaches. Negative for dizziness and weakness.  Endo/Heme/Allergies: Negative.   Psychiatric/Behavioral: Negative.      Physical Exam Vitals and nursing note reviewed.  Constitutional:      General: She is not in acute distress.    Appearance: She is well-developed.  Neck:     Vascular: No JVD.  Cardiovascular:     Rate and Rhythm: Normal rate and regular rhythm.  Pulmonary:     Breath sounds: Normal breath sounds. No decreased breath sounds, wheezing, rhonchi or rales.  Musculoskeletal:     Right lower leg: Edema (trace) present.     Left lower leg: Edema (trace) present.  Skin:    General: Skin is warm and dry.  Neurological:     General: No focal deficit present.     Mental  Status: She is alert.  Psychiatric:        Mood and Affect: Mood normal.     Lab Results  Component Value Date   CREATININE 1.05 (H) 11/25/2021   CREATININE 1.18 (H) 10/17/2021   CREATININE 0.99 10/16/2021    Filed Weights   11/25/21 1146  Weight: 212 lb 5 oz (96.3 kg)    Today's Vitals   11/25/21 1146  BP: 127/72  Pulse: 84  Resp: 20  SpO2: 96%  Weight: 212 lb 5 oz (96.3 kg)  Height: 5\' 3"  (1.6 m)  PainSc: 0-No pain   Body mass index is 37.61 kg/m.   Assessment and Plan:  Chronic diastolic heart failure- - NYHA II today - euvolemic - not weighing daily, no scales, provided scales with instructions to weigh daily and report weight gain of > 3lb/night or 5 lbs/week - discussed low sodium diet, low sodium cookbook provided - discussed fluid restriction, < 64 ounces/day - repeat echo on 11/26/21 - will see [place] on 11/27/21 - discussed possible  medication adjustments depending on results of echo - lasix 40mg  QD - she does mention cramping in her legs & toes at times; will check BMP today to make sure she isn't hypokalemic - BNP 10/13/21 was 8.4  2. HTN - - BP 122/72 today  - needs to schedule f/u appt with [facility] for post-hospitalization visit - BMP 11/25/21 reviewed and showed sodium 136, potassium 3.8, creatinine 1.05 and GFR 60  3. Tobacco abuse - - smoking 2 cigarettes/day - encouraged complete cessation  4: DM- - A1c 10/14/21 was 7.2%   Medication bottles were brought and individually reviewed.  Return in 5 weeks or sooner if needed.

## 2021-11-25 NOTE — Telephone Encounter (Signed)
Agree with NP student

## 2021-11-26 ENCOUNTER — Other Ambulatory Visit (INDEPENDENT_AMBULATORY_CARE_PROVIDER_SITE_OTHER): Payer: Medicaid Other

## 2021-11-26 ENCOUNTER — Telehealth: Payer: Self-pay | Admitting: Medical

## 2021-11-26 ENCOUNTER — Other Ambulatory Visit: Payer: Self-pay

## 2021-11-26 ENCOUNTER — Ambulatory Visit (INDEPENDENT_AMBULATORY_CARE_PROVIDER_SITE_OTHER): Payer: Medicaid Other

## 2021-11-26 DIAGNOSIS — I3139 Other pericardial effusion (noninflammatory): Secondary | ICD-10-CM | POA: Diagnosis not present

## 2021-11-26 LAB — ECHOCARDIOGRAM LIMITED
Calc EF: 65.1 %
S' Lateral: 3.3 cm
Single Plane A2C EF: 64.9 %
Single Plane A4C EF: 69.4 %

## 2021-11-26 NOTE — Progress Notes (Deleted)
{Choose 1 Note Type (Video or Telephone):[phone]}    Date:  11/26/2021   ID:  Jaime Fitzgerald, DOB April 22, 1961, MRN [id number] The patient was identified using 2 identifiers.  {Patient [phone]::"Home"} {Provider Location:[phone]::"Home Office"}   PCP:  [facility] [place] Providers Cardiologist:  None { Click to update primary MD,subspecialty MD or APP then REFRESH:1}    Evaluation Performed:  {Choose Visit Type:[phone]::"Follow-Up Visit"}  Chief Complaint:1 month follow-up  History of Present Illness:    Jaime Fitzgerald is a 61 y.o. female with heart failure, mildly reduced EF, HTN, previous GIB with blood loss anemia, astha/COPD, tobacco use, DM2, OSA, obesity.   Hospitalized at [facility] with shortness of breath that started after a URI. CTA showed cardiomegaly with small pericardial effusion. She had an echo that showed EF 45-50% with G3DD. The patient improved with diuresis and was discharged on 2 LO2.   Last seen 10/30/21 and weight was up to 215lbs, reported progressive leg edema and abdominal swelling. Lasix was increased to 40mg  daily. BMET in a weel. Limited echo was ordered for pericardial effusion.   Echo showed LVEF 60-65%, mild LVH, no cardiac tamponade, trivial MR.   Today,   The patient {does/does not:200015} have symptoms concerning for COVID-19 infection (fever, chills, cough, or new shortness of breath).    Past Medical History:  Diagnosis Date   Acute blood loss anemia 10/11/2019   Acute GI bleeding 10/11/2019   Anginal pain (HCC)    Arthritis    Asthma    CHF (congestive heart failure) (HCC)    Diabetes mellitus without complication (HCC)    Dyspnea    Hypertension    Seizures ([place])    Sleep apnea    Past Surgical History:  Procedure Laterality Date   CARPAL TUNNEL RELEASE     COLONOSCOPY WITH PROPOFOL N/A 01/17/2020   Procedure: COLONOSCOPY WITH PROPOFOL;  Surgeon: Lin Landsman, MD;   Location: [facility] ENDOSCOPY;  Service: Gastroenterology;  Laterality: N/A;   ESOPHAGOGASTRODUODENOSCOPY N/A 10/13/2019   Procedure: ESOPHAGOGASTRODUODENOSCOPY (EGD);  Surgeon: Virgel Manifold, MD;  Location: [facility] ENDOSCOPY;  Service: Endoscopy;  Laterality: N/A;   ESOPHAGOGASTRODUODENOSCOPY N/A 09/18/2020   Procedure: ESOPHAGOGASTRODUODENOSCOPY (EGD);  Surgeon: Toledo, Benay Pike, MD;  Location: [facility] ENDOSCOPY;  Service: Gastroenterology;  Laterality: N/A;   ESOPHAGOGASTRODUODENOSCOPY (EGD) WITH PROPOFOL N/A 10/11/2019   Procedure: ESOPHAGOGASTRODUODENOSCOPY (EGD) WITH PROPOFOL;  Surgeon: Virgel Manifold, MD;  Location: [facility] ENDOSCOPY;  Service: Endoscopy;  Laterality: N/A;   ESOPHAGOGASTRODUODENOSCOPY (EGD) WITH PROPOFOL N/A 01/17/2020   Procedure: ESOPHAGOGASTRODUODENOSCOPY (EGD) WITH PROPOFOL;  Surgeon: Lin Landsman, MD;  Location: [facility] ENDOSCOPY;  Service: Gastroenterology;  Laterality: N/A;   TONSILLECTOMY       No outpatient medications have been marked as taking for the 11/27/21 encounter (Appointment) with Kathlen Mody, Lyssa Hackley H, PA-C.     Allergies:   Aspirin and Penicillins   Social History   Tobacco Use   Smoking status: Former    Types: Cigarettes   Smokeless tobacco: Never  Substance Use Topics   Alcohol use: Not Currently   Drug use: Not Currently     Family Hx: The patient's family history includes Heart Problems in her mother; Seizures in her father.  ROS:   Please see the history of present illness.    *** All other systems reviewed and are negative.   Prior CV studies:   The following studies were reviewed today:  ***  Labs/Other Tests and Data Reviewed:  EKG:  {EKG/Telemetry Strips Reviewed:[phone]}  Recent Labs: 10/13/2021: ALT 11; B Natriuretic Peptide 8.4 10/14/2021: TSH 1.955 10/17/2021: Hemoglobin 16.0; Magnesium 2.2; Platelets 217 11/25/2021: BUN 19; Creatinine, Ser 1.05; Potassium 3.8; Sodium 136   Recent Lipid Panel No results found  for: CHOL, TRIG, HDL, CHOLHDL, LDLCALC, LDLDIRECT  Wt Readings from Last 3 Encounters:  11/25/21 212 lb 5 oz (96.3 kg)  10/30/21 215 lb (97.5 kg)  10/14/21 270 lb 8.1 oz (122.7 kg)     Risk Assessment/Calculations:   {Does this patient have ATRIAL FIBRILLATION?:[phone]}      Objective:    Vital Signs:  There were no vitals taken for this visit.   {HeartCare Virtual Exam (Optional):[id number]::"VITAL SIGNS:  reviewed"}  ASSESSMENT & PLAN:    Chronic diastolic heart failure  Moderate pericardial effusion  HTN  HLD      {Are you ordering a CV Procedure (e.g. stress test, cath, DCCV, TEE, etc)?   Press F2        :[id number]}    COVID-19 Education: The signs and symptoms of COVID-19 were discussed with the patient and how to seek care for testing (follow up with PCP or arrange E-visit).  ***The importance of social distancing was discussed today.  Time:   Today, I have spent *** minutes with the patient with telehealth technology discussing the above problems.     Medication Adjustments/Labs and Tests Ordered: Current medicines are reviewed at length with the patient today.  Concerns regarding medicines are outlined above.   Tests Ordered: No orders of the defined types were placed in this encounter.   Medication Changes: No orders of the defined types were placed in this encounter.   Follow Up:  {F/U Format:[phone]} {follow up:15908}  Signed, Emilyrose Darrah Ninfa Meeker, PA-C  11/26/2021 5:01 PM    [facility]

## 2021-11-26 NOTE — Telephone Encounter (Signed)
°  Patient Consent for Virtual Visit        Jaime Fitzgerald has provided verbal consent on 11/26/2021 for a virtual visit (video or telephone).   CONSENT FOR VIRTUAL VISIT FOR:  Jaime Fitzgerald  By participating in this virtual visit I agree to the following:  I hereby voluntarily request, consent and authorize [facility] and its employed or contracted physicians, physician assistants, nurse practitioners or other licensed health care professionals (the Practitioner), to provide me with telemedicine health care services (the Services") as deemed necessary by the treating Practitioner. I acknowledge and consent to receive the Services by the Practitioner via telemedicine. I understand that the telemedicine visit will involve communicating with the Practitioner through live audiovisual communication technology and the disclosure of certain medical information by electronic transmission. I acknowledge that I have been given the opportunity to request an in-person assessment or other available alternative prior to the telemedicine visit and am voluntarily participating in the telemedicine visit.  I understand that I have the right to withhold or withdraw my consent to the use of telemedicine in the course of my care at any time, without affecting my right to future care or treatment, and that the Practitioner or I may terminate the telemedicine visit at any time. I understand that I have the right to inspect all information obtained and/or recorded in the course of the telemedicine visit and may receive copies of available information for a reasonable fee.  I understand that some of the potential risks of receiving the Services via telemedicine include:  Delay or interruption in medical evaluation due to technological equipment failure or disruption; Information transmitted may not be sufficient (e.g. poor resolution of images) to allow for appropriate medical decision making by the Practitioner; and/or   In rare instances, security protocols could fail, causing a breach of personal health information.  Furthermore, I acknowledge that it is my responsibility to provide information about my medical history, conditions and care that is complete and accurate to the best of my ability. I acknowledge that Practitioner's advice, recommendations, and/or decision may be based on factors not within their control, such as incomplete or inaccurate data provided by me or distortions of diagnostic images or specimens that may result from electronic transmissions. I understand that the practice of medicine is not an exact science and that Practitioner makes no warranties or guarantees regarding treatment outcomes. I acknowledge that a copy of this consent can be made available to me via my patient portal ([facility]), or I can request a printed copy by calling the office of [facility].    I understand that my insurance will be billed for this visit.   I have read or had this consent read to me. I understand the contents of this consent, which adequately explains the benefits and risks of the Services being provided via telemedicine.  I have been provided ample opportunity to ask questions regarding this consent and the Services and have had my questions answered to my satisfaction. I give my informed consent for the services to be provided through the use of telemedicine in my medical care

## 2021-11-27 ENCOUNTER — Encounter (INDEPENDENT_AMBULATORY_CARE_PROVIDER_SITE_OTHER): Payer: Medicaid Other | Admitting: Medical

## 2021-11-27 ENCOUNTER — Telehealth: Payer: Self-pay

## 2021-11-27 LAB — BASIC METABOLIC PANEL
BUN/Creatinine Ratio: 21 (ref 12–28)
BUN: 21 mg/dL (ref 8–27)
CO2: 26 mmol/L (ref 20–29)
Calcium: 8.8 mg/dL (ref 8.7–10.3)
Chloride: 95 mmol/L — ABNORMAL LOW (ref 96–106)
Creatinine, Ser: 0.99 mg/dL (ref 0.57–1.00)
Glucose: 123 mg/dL — ABNORMAL HIGH (ref 70–99)
Potassium: 4.3 mmol/L (ref 3.5–5.2)
Sodium: 139 mmol/L (ref 134–144)
eGFR: 64 mL/min/{1.73_m2} (ref >59)

## 2021-11-27 NOTE — Telephone Encounter (Signed)
Called to give the patient echo results. Lmtcb. 

## 2021-11-27 NOTE — Telephone Encounter (Signed)
LMOV to reschedule  

## 2021-11-27 NOTE — Telephone Encounter (Signed)
-----   Message from Wellington Hampshire, MD sent at 11/27/2021 11:38 AM EST ----- Inform patient that echo was fine.  Normal ejection fraction with only trace pericardial effusion.

## 2021-11-27 NOTE — Telephone Encounter (Signed)
Patient made aware of echo results with verbalized understanding.  Patient missed her virtual appt that was scheduled today with Cadence Kathlen Mody, [place]. Adv the patient that I will fwd the message to scheduling to call her to reschedule.

## 2021-11-27 NOTE — Telephone Encounter (Signed)
Patient returning call.

## 2021-12-06 DIAGNOSIS — R0602 Shortness of breath: Secondary | ICD-10-CM | POA: Diagnosis not present

## 2021-12-06 DIAGNOSIS — J9601 Acute respiratory failure with hypoxia: Secondary | ICD-10-CM | POA: Diagnosis not present

## 2021-12-23 ENCOUNTER — Ambulatory Visit: Payer: Medicaid Other | Admitting: Family

## 2021-12-31 NOTE — Progress Notes (Signed)
? Patient ID: Jaime Fitzgerald, female    DOB: 08-13-1961, 61 y.o.   MRN: [id number] ? ?HPI ? ?Jaime Fitzgerald is a 61 year old female with a past medical history of hypertension, acute blood loss anemia with history of GI bleed, asthma, diabetes mellitus type 2, seizures, sleep apnea, dyspnea, obesity. ? ?Echo report from 11/26/21 reviewed and showed an EF of 60-65% along with mild LVH. Echo report from 10/14/21 reviewed and showed: EF of 45-50% with moderate LV dysfunction, GIII restrictive. Repeat echo scheduled on 11/26/21 by Dr. Fletcher Anon as his review of the echo did not match the above interpretation.  ? ?Admitted 10/13/21-10/17/21 with SOB, acute hypoxic respiratory failure, acute combined HF exacerbation and AKI. ? ?She presents today for a follow-up visit with a chief complaint of minimal shortness of breath upon moderate exertion. She describes this as chronic in nature. She has associated fatigue, cough, abdominal distention, dizziness, back pain and difficulty sleeping along with this. She denies any palpitations, pedl edema, chest pain or weight gain.  ? ?She says that she knows she's drinking too much fluids but "always feels thirsty". Drinks ~ 64 ounces of caffeine tea, 3 large cups of caffeine coffee and lots of OJ daily.  ? ?Wears her oxygen at bedtime and occasionally during the day.  ? ?Past Medical History:  ?Diagnosis Date  ? Acute blood loss anemia 10/11/2019  ? Acute GI bleeding 10/11/2019  ? Anginal pain ([place])   ? Arthritis   ? Asthma   ? CHF (congestive heart failure) ([place])   ? Diabetes mellitus without complication ([place])   ? Dyspnea   ? Hypertension   ? Seizures ([place])   ? Sleep apnea   ? ?Past Surgical History:  ?Procedure Laterality Date  ? CARPAL TUNNEL RELEASE    ? COLONOSCOPY WITH PROPOFOL N/A 01/17/2020  ? Procedure: COLONOSCOPY WITH PROPOFOL;  Surgeon: Lin Landsman, MD;  Location: [facility] ENDOSCOPY;  Service: Gastroenterology;  Laterality: N/A;  ? ESOPHAGOGASTRODUODENOSCOPY N/A 10/13/2019  ?  Procedure: ESOPHAGOGASTRODUODENOSCOPY (EGD);  Surgeon: Virgel Manifold, MD;  Location: [facility] ENDOSCOPY;  Service: Endoscopy;  Laterality: N/A;  ? ESOPHAGOGASTRODUODENOSCOPY N/A 09/18/2020  ? Procedure: ESOPHAGOGASTRODUODENOSCOPY (EGD);  Surgeon: Toledo, Benay Pike, MD;  Location: [facility] ENDOSCOPY;  Service: Gastroenterology;  Laterality: N/A;  ? ESOPHAGOGASTRODUODENOSCOPY (EGD) WITH PROPOFOL N/A 10/11/2019  ? Procedure: ESOPHAGOGASTRODUODENOSCOPY (EGD) WITH PROPOFOL;  Surgeon: Virgel Manifold, MD;  Location: [facility] ENDOSCOPY;  Service: Endoscopy;  Laterality: N/A;  ? ESOPHAGOGASTRODUODENOSCOPY (EGD) WITH PROPOFOL N/A 01/17/2020  ? Procedure: ESOPHAGOGASTRODUODENOSCOPY (EGD) WITH PROPOFOL;  Surgeon: Lin Landsman, MD;  Location: [facility] ENDOSCOPY;  Service: Gastroenterology;  Laterality: N/A;  ? TONSILLECTOMY    ? ?Family History  ?Problem Relation Age of Onset  ? Heart Problems Mother   ? Seizures Father   ? ?Social History  ? ?Tobacco Use  ? Smoking status: Former  ?  Types: Cigarettes  ? Smokeless tobacco: Never  ?Substance Use Topics  ? Alcohol use: Not Currently  ? ?Allergies  ?Allergen Reactions  ? Aspirin Nausea Only and Other (See Comments)  ?  GI upset  ? Penicillins Rash  ? ?Prior to Admission medications   ?Medication Sig Start Date End Date Taking? Authorizing Provider  ?albuterol (VENTOLIN HFA) 108 (90 Base) MCG/ACT inhaler Inhale 2 puffs into the lungs every 6 (six) hours as needed for wheezing or shortness of breath. 10/14/19  Yes Nolberto Hanlon, MD  ?amLODipine (NORVASC) 5 MG tablet Take 1 tablet (5 mg total) by mouth daily.  10/30/21  Yes Wellington Hampshire, MD  ?empagliflozin (JARDIANCE) 10 MG TABS tablet Take 1 tablet (10 mg total) by mouth daily before breakfast. 01/01/22  Yes Alisa Graff, FNP  ?Fluticasone Propionate, Inhal, (FLOVENT IN) Inhale into the lungs as needed.   Yes [provider]  ?furosemide (LASIX) 40 MG tablet Take 1 tablet (40 mg total) by mouth daily. 10/30/21  Yes Wellington Hampshire, MD  ?glipiZIDE (GLUCOTROL XL) 10 MG 24 hr tablet Take 10 mg by mouth 2 (two) times daily. 10/27/21  Yes [provider]  ?meclizine (ANTIVERT) 12.5 MG tablet Take 1 tablet (12.5 mg total) by mouth 3 (three) times daily as needed for up to 30 doses for dizziness. 09/20/20  Yes Ezekiel Slocumb, DO  ?vitamin B-12 (CYANOCOBALAMIN) 1000 MCG tablet Take 1 tablet (1,000 mcg total) by mouth daily. 08/05/21  Yes Vanga, Tally Due, MD  ?witch hazel-glycerin (TUCKS) pad Apply topically as needed for itching. 10/17/21  Yes Sreenath, Sudheer B, MD  ?cyclobenzaprine (FLEXERIL) 10 MG tablet Take 10 mg by mouth as needed for muscle spasms. ?Patient not taking: Reported on 01/01/2022    [provider]  ?metoprolol succinate (TOPROL XL) 25 MG 24 hr tablet Take 1 tablet (25 mg total) by mouth daily. ?Patient not taking: Reported on 01/01/2022 10/17/21   Sidney Ace, MD  ?pantoprazole (PROTONIX) 40 MG tablet Take 1 tablet (40 mg total) by mouth daily. ?Patient not taking: Reported on 11/25/2021 10/17/21 11/16/21  Sidney Ace, MD  ?pravastatin (PRAVACHOL) 40 MG tablet Take 1 tablet (40 mg total) by mouth daily. 01/01/22   Alisa Graff, FNP  ? ? ?Review of Systems  ?Constitutional:  Positive for fatigue. Negative for appetite change.  ?HENT:  Negative for congestion, postnasal drip and sore throat.   ?Eyes: Negative.   ?Respiratory:  Positive for cough and shortness of breath.   ?Cardiovascular:  Negative for chest pain, palpitations and leg swelling.  ?Gastrointestinal:  Positive for abdominal distention. Negative for abdominal pain.  ?Endocrine: Negative.   ?Genitourinary: Negative.   ?Musculoskeletal:  Positive for back pain. Negative for neck pain.  ?Skin: Negative.   ?Allergic/Immunologic: Negative.   ?Neurological:  Positive for dizziness. Negative for light-headedness.  ?Hematological:  Negative for adenopathy. Does not bruise/bleed easily.  ?Psychiatric/Behavioral:  Positive for sleep  disturbance (sleeping on 3 pillows with oxygen at 2.5L). Negative for dysphoric mood. The patient is not nervous/anxious.   ? ?Vitals:  ? 01/01/22 1033  ?BP: (!) 141/73  ?Pulse: 87  ?Resp: 18  ?SpO2: 99%  ?Weight: 216 lb 8 oz (98.2 kg)  ?Height: '5\' 3"'$  (1.6 m)  ? ?Wt Readings from Last 3 Encounters:  ?01/01/22 216 lb 8 oz (98.2 kg)  ?11/25/21 212 lb 5 oz (96.3 kg)  ?10/30/21 215 lb (97.5 kg)  ? ?Lab Results  ?Component Value Date  ? CREATININE 0.99 11/26/2021  ? CREATININE 1.05 (H) 11/25/2021  ? CREATININE 1.18 (H) 10/17/2021  ? ? ?Physical Exam ?Vitals and nursing note reviewed. Exam conducted with a chaperone present (friend Sonia Side).  ?Constitutional:   ?   Appearance: Normal appearance.  ?HENT:  ?   Head: Normocephalic and atraumatic.  ?Cardiovascular:  ?   Rate and Rhythm: Normal rate and regular rhythm.  ?Pulmonary:  ?   Effort: Pulmonary effort is normal. No respiratory distress.  ?   Breath sounds: No wheezing or rales.  ?Abdominal:  ?   General: There is no distension.  ?   Palpations:  Abdomen is soft.  ?Musculoskeletal:     ?   General: No tenderness.  ?   Cervical back: Normal range of motion and neck supple.  ?   Right lower leg: Edema (trace pitting) present.  ?   Left lower leg: Edema (trace pitting) present.  ?Skin: ?   General: Skin is warm and dry.  ?Neurological:  ?   General: No focal deficit present.  ?   Mental Status: She is alert and oriented to person, place, and time.  ?Psychiatric:     ?   Mood and Affect: Mood normal.     ?   Behavior: Behavior normal.     ?   Thought Content: Thought content normal.  ?  ?Assessment and Plan: ? ?Chronic heart failure with preserved ejection fraction with structural changes (LVH)- ?- NYHA II today ?- euvolemic ?- weighing daily; reminded to weigh daily and report weight gain of > 3lb/night or 5 lbs/week ?- weight stable from last visit here 1 month ago ?- discussed fluid restriction, < 64 ounces/day, again, as she's drinking too much fluids (best estimate >100  ounces daily); also drinking too much caffeine which is why she is probably feeling thirsty ?- using NoSalt as her seasoning ?- NS with cardiology Kathlen Mody) on 11/27/21 ?- will add jardiance '10mg'$  daily; 2 wee

## 2022-01-01 ENCOUNTER — Ambulatory Visit: Payer: Medicaid Other | Attending: Family | Admitting: Family

## 2022-01-01 ENCOUNTER — Encounter: Payer: Self-pay | Admitting: Family

## 2022-01-01 VITALS — BP 141/73 | HR 87 | Resp 18 | Ht 63.0 in | Wt 216.5 lb

## 2022-01-01 DIAGNOSIS — G4730 Sleep apnea, unspecified: Secondary | ICD-10-CM | POA: Diagnosis not present

## 2022-01-01 DIAGNOSIS — I10 Essential (primary) hypertension: Secondary | ICD-10-CM | POA: Diagnosis not present

## 2022-01-01 DIAGNOSIS — M549 Dorsalgia, unspecified: Secondary | ICD-10-CM | POA: Insufficient documentation

## 2022-01-01 DIAGNOSIS — R0600 Dyspnea, unspecified: Secondary | ICD-10-CM | POA: Diagnosis not present

## 2022-01-01 DIAGNOSIS — J45909 Unspecified asthma, uncomplicated: Secondary | ICD-10-CM | POA: Diagnosis not present

## 2022-01-01 DIAGNOSIS — E669 Obesity, unspecified: Secondary | ICD-10-CM | POA: Insufficient documentation

## 2022-01-01 DIAGNOSIS — F17210 Nicotine dependence, cigarettes, uncomplicated: Secondary | ICD-10-CM | POA: Diagnosis not present

## 2022-01-01 DIAGNOSIS — Z9981 Dependence on supplemental oxygen: Secondary | ICD-10-CM | POA: Diagnosis not present

## 2022-01-01 DIAGNOSIS — I110 Hypertensive heart disease with heart failure: Secondary | ICD-10-CM | POA: Insufficient documentation

## 2022-01-01 DIAGNOSIS — R569 Unspecified convulsions: Secondary | ICD-10-CM | POA: Diagnosis not present

## 2022-01-01 DIAGNOSIS — Z720 Tobacco use: Secondary | ICD-10-CM

## 2022-01-01 DIAGNOSIS — E119 Type 2 diabetes mellitus without complications: Secondary | ICD-10-CM | POA: Diagnosis not present

## 2022-01-01 DIAGNOSIS — I5032 Chronic diastolic (congestive) heart failure: Secondary | ICD-10-CM | POA: Insufficient documentation

## 2022-01-01 DIAGNOSIS — Z8719 Personal history of other diseases of the digestive system: Secondary | ICD-10-CM | POA: Diagnosis not present

## 2022-01-01 DIAGNOSIS — D62 Acute posthemorrhagic anemia: Secondary | ICD-10-CM | POA: Insufficient documentation

## 2022-01-01 MED ORDER — EMPAGLIFLOZIN 10 MG PO TABS: 10.0000 mg | ORAL_TABLET | Freq: Every day | ORAL | 5 refills | Status: DC

## 2022-01-01 MED ORDER — PRAVASTATIN SODIUM 40 MG PO TABS: 40.0000 mg | ORAL_TABLET | Freq: Every day | ORAL | 5 refills | Status: DC

## 2022-01-01 NOTE — Patient Instructions (Addendum)
Continue weighing daily and call for an overnight weight gain of 3 pounds or more or a weekly weight gain of more than 5 pounds. ? ? ?If you have voicemail, please make sure your mailbox is cleaned out so that we may leave a message and please make sure to listen to any voicemails.  ? ?Call [facility] and get an appointment scheduled. . ? ? ?Begin jardiance as 1 tablet every morning.  ? ? ? ?

## 2022-01-05 ENCOUNTER — Other Ambulatory Visit: Payer: Self-pay | Admitting: Family

## 2022-01-05 MED ORDER — PRAVASTATIN SODIUM 40 MG PO TABS: 40.0000 mg | ORAL_TABLET | Freq: Every day | ORAL | 5 refills | Status: DC

## 2022-01-05 MED ORDER — EMPAGLIFLOZIN 10 MG PO TABS: 10.0000 mg | ORAL_TABLET | Freq: Every day | ORAL | 5 refills | Status: DC

## 2022-01-05 NOTE — Progress Notes (Signed)
Jardiance and pravastatin RX sent (again) to [facility].  ?

## 2022-01-06 DIAGNOSIS — R0602 Shortness of breath: Secondary | ICD-10-CM | POA: Diagnosis not present

## 2022-01-06 DIAGNOSIS — J9601 Acute respiratory failure with hypoxia: Secondary | ICD-10-CM | POA: Diagnosis not present

## 2022-01-18 ENCOUNTER — Other Ambulatory Visit: Payer: Self-pay | Admitting: Gastroenterology

## 2022-02-02 ENCOUNTER — Telehealth: Payer: Self-pay | Admitting: Family

## 2022-02-02 NOTE — Telephone Encounter (Signed)
Called [employer] [employer] to get approval for jardiance '10mg'$  daily. Approval received through 01/28/23. Approval number is [id number] ?

## 2022-02-03 ENCOUNTER — Ambulatory Visit: Payer: Medicaid Other | Admitting: Family

## 2022-02-05 DIAGNOSIS — R0602 Shortness of breath: Secondary | ICD-10-CM | POA: Diagnosis not present

## 2022-02-05 DIAGNOSIS — J9601 Acute respiratory failure with hypoxia: Secondary | ICD-10-CM | POA: Diagnosis not present

## 2022-02-09 NOTE — Telephone Encounter (Signed)
Attempted to schedule.  

## 2022-02-09 NOTE — Telephone Encounter (Signed)
Attempted to schedule overdue visit.  Patient confused about chf clinic and cardio clinic and did not understand difference after attempts to explain .   ? ?Will fu after chf appt to try to schedule .  Patient had medication questions perscribed by chf clinic. Transferred call to chf clinic .  ?

## 2022-02-10 NOTE — Progress Notes (Signed)
? Patient ID: Jaime Fitzgerald, female    DOB: June 17, 1961, 61 y.o.   MRN: [id number] ? ?HPI ? ?Jaime Fitzgerald is a 61 year old female with a past medical history of hypertension, acute blood loss anemia with history of GI bleed, asthma, diabetes mellitus type 2, seizures, sleep apnea, dyspnea, obesity. ? ?Echo report from 11/26/21 reviewed and showed an EF of 60-65% along with mild LVH. Echo report from 10/14/21 reviewed and showed: EF of 45-50% with moderate LV dysfunction, GIII restrictive. Repeat echo scheduled on 11/26/21 by Dr. Fletcher Anon as his review of the echo did not match the above interpretation.  ? ?Admitted 10/13/21-10/17/21 with SOB, acute hypoxic respiratory failure, acute combined HF exacerbation and AKI. ? ?She presents today for a follow-up visit with a chief complaint of minimal fatigue upon moderate exertion. She describes this as chronic in nature. She has associated cough, shortness of breath, dizziness, chronic difficulty sleeping and chronic back pain. She denies any abdominal distention, palpitations, pedal edema, chest pain or weight gain.  ? ?She hasn't taken her furosemide yet today but is planning on taking it when she returns home today. Says that she has been out of her metoprolol for the last month.  ? ?She says that she wears her CPAP at night but has great difficulty in putting it on by herself. She lives in assisted housing and needs a letter saying that her friend can stay with her at night to assist with getting her CPAP on and monitoring it.  ? ?Past Medical History:  ?Diagnosis Date  ? Acute blood loss anemia 10/11/2019  ? Acute GI bleeding 10/11/2019  ? Anginal pain ([place])   ? Arthritis   ? Asthma   ? CHF (congestive heart failure) ([place])   ? Diabetes mellitus without complication ([place])   ? Dyspnea   ? Hypertension   ? Seizures ([place])   ? Sleep apnea   ? ?Past Surgical History:  ?Procedure Laterality Date  ? CARPAL TUNNEL RELEASE    ? COLONOSCOPY WITH PROPOFOL N/A 01/17/2020  ? Procedure:  COLONOSCOPY WITH PROPOFOL;  Surgeon: Lin Landsman, MD;  Location: [facility] ENDOSCOPY;  Service: Gastroenterology;  Laterality: N/A;  ? ESOPHAGOGASTRODUODENOSCOPY N/A 10/13/2019  ? Procedure: ESOPHAGOGASTRODUODENOSCOPY (EGD);  Surgeon: Virgel Manifold, MD;  Location: [facility] ENDOSCOPY;  Service: Endoscopy;  Laterality: N/A;  ? ESOPHAGOGASTRODUODENOSCOPY N/A 09/18/2020  ? Procedure: ESOPHAGOGASTRODUODENOSCOPY (EGD);  Surgeon: Toledo, Benay Pike, MD;  Location: [facility] ENDOSCOPY;  Service: Gastroenterology;  Laterality: N/A;  ? ESOPHAGOGASTRODUODENOSCOPY (EGD) WITH PROPOFOL N/A 10/11/2019  ? Procedure: ESOPHAGOGASTRODUODENOSCOPY (EGD) WITH PROPOFOL;  Surgeon: Virgel Manifold, MD;  Location: [facility] ENDOSCOPY;  Service: Endoscopy;  Laterality: N/A;  ? ESOPHAGOGASTRODUODENOSCOPY (EGD) WITH PROPOFOL N/A 01/17/2020  ? Procedure: ESOPHAGOGASTRODUODENOSCOPY (EGD) WITH PROPOFOL;  Surgeon: Lin Landsman, MD;  Location: [facility] ENDOSCOPY;  Service: Gastroenterology;  Laterality: N/A;  ? TONSILLECTOMY    ? ?Family History  ?Problem Relation Age of Onset  ? Heart Problems Mother   ? Seizures Father   ? ?Social History  ? ?Tobacco Use  ? Smoking status: Former  ?  Types: Cigarettes  ? Smokeless tobacco: Never  ?Substance Use Topics  ? Alcohol use: Not Currently  ? ?Allergies  ?Allergen Reactions  ? Aspirin Nausea Only and Other (See Comments)  ?  GI upset  ? Penicillins Rash  ? ?Past Medical History:  ?Diagnosis Date  ? Acute blood loss anemia 10/11/2019  ? Acute GI bleeding 10/11/2019  ? Anginal pain ([place])   ?  Arthritis   ? Asthma   ? CHF (congestive heart failure) ([place])   ? Diabetes mellitus without complication ([place])   ? Dyspnea   ? Hypertension   ? Seizures ([place])   ? Sleep apnea   ? ?Past Surgical History:  ?Procedure Laterality Date  ? CARPAL TUNNEL RELEASE    ? COLONOSCOPY WITH PROPOFOL N/A 01/17/2020  ? Procedure: COLONOSCOPY WITH PROPOFOL;  Surgeon: Lin Landsman, MD;  Location: [facility] ENDOSCOPY;  Service:  Gastroenterology;  Laterality: N/A;  ? ESOPHAGOGASTRODUODENOSCOPY N/A 10/13/2019  ? Procedure: ESOPHAGOGASTRODUODENOSCOPY (EGD);  Surgeon: Virgel Manifold, MD;  Location: [facility] ENDOSCOPY;  Service: Endoscopy;  Laterality: N/A;  ? ESOPHAGOGASTRODUODENOSCOPY N/A 09/18/2020  ? Procedure: ESOPHAGOGASTRODUODENOSCOPY (EGD);  Surgeon: Toledo, Benay Pike, MD;  Location: [facility] ENDOSCOPY;  Service: Gastroenterology;  Laterality: N/A;  ? ESOPHAGOGASTRODUODENOSCOPY (EGD) WITH PROPOFOL N/A 10/11/2019  ? Procedure: ESOPHAGOGASTRODUODENOSCOPY (EGD) WITH PROPOFOL;  Surgeon: Virgel Manifold, MD;  Location: [facility] ENDOSCOPY;  Service: Endoscopy;  Laterality: N/A;  ? ESOPHAGOGASTRODUODENOSCOPY (EGD) WITH PROPOFOL N/A 01/17/2020  ? Procedure: ESOPHAGOGASTRODUODENOSCOPY (EGD) WITH PROPOFOL;  Surgeon: Lin Landsman, MD;  Location: [facility] ENDOSCOPY;  Service: Gastroenterology;  Laterality: N/A;  ? TONSILLECTOMY    ? ?Family History  ?Problem Relation Age of Onset  ? Heart Problems Mother   ? Seizures Father   ? ?Social History  ? ?Tobacco Use  ? Smoking status: Former  ?  Types: Cigarettes  ? Smokeless tobacco: Never  ?Substance Use Topics  ? Alcohol use: Not Currently  ? ?Allergies  ?Allergen Reactions  ? Aspirin Nausea Only and Other (See Comments)  ?  GI upset  ? Penicillins Rash  ? ?Prior to Admission medications   ?Medication Sig Start Date End Date Taking? Authorizing Provider  ?albuterol (VENTOLIN HFA) 108 (90 Base) MCG/ACT inhaler Inhale 2 puffs into the lungs every 6 (six) hours as needed for wheezing or shortness of breath. 10/14/19  Yes Nolberto Hanlon, MD  ?amLODipine (NORVASC) 5 MG tablet Take 1 tablet (5 mg total) by mouth daily. 10/30/21  Yes Wellington Hampshire, MD  ?cyclobenzaprine (FLEXERIL) 10 MG tablet Take 10 mg by mouth as needed for muscle spasms.   Yes [provider]  ?empagliflozin (JARDIANCE) 10 MG TABS tablet Take 1 tablet (10 mg total) by mouth daily before breakfast. 01/05/22  Yes Alisa Graff, FNP   ?Fluticasone Propionate, Inhal, (FLOVENT IN) Inhale into the lungs as needed.   Yes [provider]  ?furosemide (LASIX) 40 MG tablet Take 1 tablet (40 mg total) by mouth daily. 10/30/21  Yes Wellington Hampshire, MD  ?glipiZIDE (GLUCOTROL XL) 10 MG 24 hr tablet Take 10 mg by mouth 2 (two) times daily. 10/27/21  Yes [provider]  ?pantoprazole (PROTONIX) 40 MG tablet Take 1 tablet (40 mg total) by mouth daily. 10/17/21  Yes Sreenath, Sudheer B, MD  ?pravastatin (PRAVACHOL) 40 MG tablet Take 1 tablet (40 mg total) by mouth daily. 01/05/22  Yes Alisa Graff, FNP  ?vitamin B-12 (CYANOCOBALAMIN) 1000 MCG tablet Take 1 tablet (1,000 mcg total) by mouth daily. 08/05/21  Yes Vanga, Tally Due, MD  ?witch hazel-glycerin (TUCKS) pad Apply topically as needed for itching. 10/17/21  Yes Sidney Ace, MD  ?meclizine (ANTIVERT) 12.5 MG tablet Take 1 tablet (12.5 mg total) by mouth 3 (three) times daily as needed for up to 30 doses for dizziness. ?Patient not taking: Reported on 02/11/2022 09/20/20   Nicole Kindred A, DO  ?metoprolol succinate (TOPROL XL) 25 MG 24  hr tablet Take 1 tablet (25 mg total) by mouth daily. ?Patient not taking: Reported on 02/11/2022 10/17/21   Sidney Ace, MD  ? ? ?Review of Systems  ?Constitutional:  Positive for fatigue. Negative for appetite change.  ?HENT:  Negative for congestion, postnasal drip and sore throat.   ?Eyes: Negative.   ?Respiratory:  Positive for cough and shortness of breath.   ?Cardiovascular:  Negative for chest pain, palpitations and leg swelling.  ?Gastrointestinal:  Negative for abdominal distention and abdominal pain.  ?Endocrine: Negative.   ?Genitourinary: Negative.   ?Musculoskeletal:  Positive for back pain. Negative for neck pain.  ?Skin: Negative.   ?Allergic/Immunologic: Negative.   ?Neurological:  Positive for dizziness. Negative for light-headedness.  ?Hematological:  Negative for adenopathy. Does not bruise/bleed easily.   ?Psychiatric/Behavioral:  Positive for sleep disturbance (sleeping on 3 pillows with oxygen at 2.5L). Negative for dysphoric mood. The patient is not nervous/anxious.   ? ?Vitals:  ? 02/11/22 1227  ?BP: 134/77  ?Pulse: 99  ?Resp: 16  ?Sp

## 2022-02-11 ENCOUNTER — Ambulatory Visit: Payer: 59 | Attending: Family | Admitting: Family

## 2022-02-11 ENCOUNTER — Encounter: Payer: Self-pay | Admitting: Family

## 2022-02-11 ENCOUNTER — Other Ambulatory Visit
Admission: RE | Admit: 2022-02-11 | Discharge: 2022-02-11 | Disposition: A | Discharge status: Home / Self Care | Payer: Medicaid Other | Source: Ambulatory Visit | Attending: Family | Admitting: Family

## 2022-02-11 VITALS — BP 134/77 | HR 99 | Resp 16 | Ht 63.0 in | Wt 209.4 lb

## 2022-02-11 DIAGNOSIS — Z720 Tobacco use: Secondary | ICD-10-CM

## 2022-02-11 DIAGNOSIS — I5032 Chronic diastolic (congestive) heart failure: Secondary | ICD-10-CM | POA: Insufficient documentation

## 2022-02-11 DIAGNOSIS — E669 Obesity, unspecified: Secondary | ICD-10-CM | POA: Diagnosis not present

## 2022-02-11 DIAGNOSIS — Z7951 Long term (current) use of inhaled steroids: Secondary | ICD-10-CM | POA: Insufficient documentation

## 2022-02-11 DIAGNOSIS — G4733 Obstructive sleep apnea (adult) (pediatric): Secondary | ICD-10-CM | POA: Diagnosis not present

## 2022-02-11 DIAGNOSIS — I10 Essential (primary) hypertension: Secondary | ICD-10-CM

## 2022-02-11 DIAGNOSIS — Z79899 Other long term (current) drug therapy: Secondary | ICD-10-CM | POA: Insufficient documentation

## 2022-02-11 DIAGNOSIS — E119 Type 2 diabetes mellitus without complications: Secondary | ICD-10-CM | POA: Diagnosis not present

## 2022-02-11 DIAGNOSIS — Z9989 Dependence on other enabling machines and devices: Secondary | ICD-10-CM | POA: Diagnosis not present

## 2022-02-11 DIAGNOSIS — F17210 Nicotine dependence, cigarettes, uncomplicated: Secondary | ICD-10-CM | POA: Diagnosis not present

## 2022-02-11 DIAGNOSIS — I110 Hypertensive heart disease with heart failure: Secondary | ICD-10-CM | POA: Diagnosis not present

## 2022-02-11 DIAGNOSIS — J45909 Unspecified asthma, uncomplicated: Secondary | ICD-10-CM | POA: Diagnosis not present

## 2022-02-11 DIAGNOSIS — R69 Illness, unspecified: Secondary | ICD-10-CM | POA: Diagnosis not present

## 2022-02-11 DIAGNOSIS — Z7984 Long term (current) use of oral hypoglycemic drugs: Secondary | ICD-10-CM | POA: Insufficient documentation

## 2022-02-11 LAB — BASIC METABOLIC PANEL
Anion gap: 8 (ref 5–15)
BUN: 26 mg/dL — ABNORMAL HIGH (ref 8–23)
CO2: 31 mmol/L (ref 22–32)
Calcium: 9.3 mg/dL (ref 8.9–10.3)
Chloride: 102 mmol/L (ref 98–111)
Creatinine, Ser: 1.5 mg/dL — ABNORMAL HIGH (ref 0.44–1.00)
GFR, Estimated: 39 mL/min — ABNORMAL LOW (ref >60)
Glucose, Bld: 55 mg/dL — ABNORMAL LOW (ref 70–99)
Potassium: 3.8 mmol/L (ref 3.5–5.1)
Sodium: 141 mmol/L (ref 135–145)

## 2022-02-11 MED ORDER — METOPROLOL SUCCINATE ER 25 MG PO TB24: 25.0000 mg | ORAL_TABLET | Freq: Every day | ORAL | 5 refills | Status: DC

## 2022-02-11 MED ORDER — EMPAGLIFLOZIN 10 MG PO TABS: 10.0000 mg | ORAL_TABLET | Freq: Every day | ORAL | 5 refills | Status: DC

## 2022-02-11 NOTE — Patient Instructions (Signed)
Continue weighing daily and call for an overnight weight gain of 3 pounds or more or a weekly weight gain of more than 5 pounds.   If you have voicemail, please make sure your mailbox is cleaned out so that we may leave a message and please make sure to listen to any voicemails.     

## 2022-02-12 ENCOUNTER — Telehealth: Payer: Self-pay | Admitting: Family

## 2022-02-12 ENCOUNTER — Encounter: Payer: Self-pay | Admitting: Family

## 2022-02-12 ENCOUNTER — Other Ambulatory Visit: Payer: Self-pay | Admitting: Family

## 2022-02-12 DIAGNOSIS — I5032 Chronic diastolic (congestive) heart failure: Secondary | ICD-10-CM

## 2022-02-12 MED ORDER — FUROSEMIDE 40 MG PO TABS: 20.0000 mg | ORAL_TABLET | Freq: Every day | ORAL | 1 refills | Status: DC

## 2022-02-12 NOTE — Progress Notes (Signed)
Lab order placed for 03/11/22 ?

## 2022-02-12 NOTE — Telephone Encounter (Signed)
Spoke with patient regarding her BMP results obtained yesterday. Renal function has declined since starting jardiance. Creatinine is now 1.5 with GFR of 39.  ? ?Advised patient to decrease her furosemide from '40mg'$  daily to '20mg'$  daily (1/2 tablet). If she notices a weight gain, swelling etc she can take an additional 1/2 tablet if needed.  ? ?Will recheck labs in 1 month. Patient verbalized understanding lasix change and repeated the instructions back to me.  ?

## 2022-03-08 DIAGNOSIS — J9601 Acute respiratory failure with hypoxia: Secondary | ICD-10-CM | POA: Diagnosis not present

## 2022-03-08 DIAGNOSIS — R0602 Shortness of breath: Secondary | ICD-10-CM | POA: Diagnosis not present

## 2022-03-10 ENCOUNTER — Telehealth: Payer: Self-pay | Admitting: Family

## 2022-03-10 NOTE — Telephone Encounter (Signed)
Called on 6/5 and 6/6 in attempt to remind patient of her lab appointment at [facility] on Wednesday 6/7 but have been unable to reach patient or leave voicemail.    Jaime Fitzgerald, NT

## 2022-03-11 ENCOUNTER — Other Ambulatory Visit: Payer: Medicaid Other

## 2022-03-16 ENCOUNTER — Telehealth: Payer: Self-pay | Admitting: Medical

## 2022-03-16 NOTE — Telephone Encounter (Signed)
Called and spoke with patient.   Advised patient that our office does not prescribe oxygen nor do anything with sleep machine devices and she would need to contact her PCP office for assistance.   Pt verbalized understanding and voiced appreciation for the call.

## 2022-03-16 NOTE — Telephone Encounter (Signed)
Pt c/o Shortness Of Breath: STAT if SOB developed within the last 24 hours or pt is noticeably SOB on the phone  1. Are you currently SOB (can you hear that pt is SOB on the phone)? yes  2. How long have you been experiencing SOB? unclear  3. Are you SOB when sitting or when up moving around?  Now out of oxygen   4. Are you currently experiencing any other symptoms? On 2.5 L o2 via [place]  since hospital visit but has been out since this morning .  Patient states adapt has not sent yet because there is a problem with paperwork .   Patient also wants assistance getting sleep machine device ordered   Patient aware cardiology does not routinely order or manage either o2 or sleep machine but states she is struggling to breath without it.

## 2022-03-17 DIAGNOSIS — J9601 Acute respiratory failure with hypoxia: Secondary | ICD-10-CM | POA: Diagnosis not present

## 2022-03-17 DIAGNOSIS — R0602 Shortness of breath: Secondary | ICD-10-CM | POA: Diagnosis not present

## 2022-04-10 NOTE — Progress Notes (Deleted)
Patient ID: Jaime Fitzgerald, female    DOB: 09-28-1961, 61 y.o.   MRN: [id number]  HPI  Jaime Fitzgerald is a 61 year old female with a past medical history of hypertension, acute blood loss anemia with history of GI bleed, asthma, diabetes mellitus type 2, seizures, sleep apnea, dyspnea, obesity.  Echo report from 11/26/21 reviewed and showed an EF of 60-65% along with mild LVH. Echo report from 10/14/21 reviewed and showed: EF of 45-50% with moderate LV dysfunction, GIII restrictive. Repeat echo scheduled on 11/26/21 by Dr. Fletcher Anon as his review of the echo did not match the above interpretation.   Admitted 10/13/21-10/17/21 with SOB, acute hypoxic respiratory failure, acute combined HF exacerbation and AKI.  She presents today for a follow-up visit with a chief complaint of   She says that she wears her CPAP at night but has great difficulty in putting it on by herself. She lives in assisted housing and needs a letter saying that her friend can stay with her at night to assist with getting her CPAP on and monitoring it.   Past Medical History:  Diagnosis Date   Acute blood loss anemia 10/11/2019   Acute GI bleeding 10/11/2019   Anginal pain ([facility])    Arthritis    Asthma    CHF (congestive heart failure) ([facility])    Diabetes mellitus without complication ([facility])    Dyspnea    Hypertension    Seizures ([place])    Sleep apnea    Past Surgical History:  Procedure Laterality Date   CARPAL TUNNEL RELEASE     COLONOSCOPY WITH PROPOFOL N/A 01/17/2020   Procedure: COLONOSCOPY WITH PROPOFOL;  Surgeon: Lin Landsman, MD;  Location: [facility] ENDOSCOPY;  Service: Gastroenterology;  Laterality: N/A;   ESOPHAGOGASTRODUODENOSCOPY N/A 10/13/2019   Procedure: ESOPHAGOGASTRODUODENOSCOPY (EGD);  Surgeon: Virgel Manifold, MD;  Location: [facility] ENDOSCOPY;  Service: Endoscopy;  Laterality: N/A;   ESOPHAGOGASTRODUODENOSCOPY N/A 09/18/2020   Procedure: ESOPHAGOGASTRODUODENOSCOPY (EGD);  Surgeon: Toledo, Benay Pike, MD;   Location: [facility] ENDOSCOPY;  Service: Gastroenterology;  Laterality: N/A;   ESOPHAGOGASTRODUODENOSCOPY (EGD) WITH PROPOFOL N/A 10/11/2019   Procedure: ESOPHAGOGASTRODUODENOSCOPY (EGD) WITH PROPOFOL;  Surgeon: Virgel Manifold, MD;  Location: [facility] ENDOSCOPY;  Service: Endoscopy;  Laterality: N/A;   ESOPHAGOGASTRODUODENOSCOPY (EGD) WITH PROPOFOL N/A 01/17/2020   Procedure: ESOPHAGOGASTRODUODENOSCOPY (EGD) WITH PROPOFOL;  Surgeon: Lin Landsman, MD;  Location: [facility] ENDOSCOPY;  Service: Gastroenterology;  Laterality: N/A;   TONSILLECTOMY     Family History  Problem Relation Age of Onset   Heart Problems Mother    Seizures Father    Social History   Tobacco Use   Smoking status: Former    Types: Cigarettes   Smokeless tobacco: Never  Substance Use Topics   Alcohol use: Not Currently   Allergies  Allergen Reactions   Aspirin Nausea Only and Other (See Comments)    GI upset   Penicillins Rash   Past Medical History:  Diagnosis Date   Acute blood loss anemia 10/11/2019   Acute GI bleeding 10/11/2019   Anginal pain ([facility])    Arthritis    Asthma    CHF (congestive heart failure) ([place])    Diabetes mellitus without complication ([place])    Dyspnea    Hypertension    Seizures ([place])    Sleep apnea    Past Surgical History:  Procedure Laterality Date   CARPAL TUNNEL RELEASE     COLONOSCOPY WITH PROPOFOL N/A 01/17/2020   Procedure: COLONOSCOPY WITH PROPOFOL;  Surgeon: Sherri Sear  Reece Levy, MD;  Location: [place];  Service: Gastroenterology;  Laterality: N/A;   ESOPHAGOGASTRODUODENOSCOPY N/A 10/13/2019   Procedure: ESOPHAGOGASTRODUODENOSCOPY (EGD);  Surgeon: Virgel Manifold, MD;  Location: [facility] ENDOSCOPY;  Service: Endoscopy;  Laterality: N/A;   ESOPHAGOGASTRODUODENOSCOPY N/A 09/18/2020   Procedure: ESOPHAGOGASTRODUODENOSCOPY (EGD);  Surgeon: Toledo, Benay Pike, MD;  Location: [facility] ENDOSCOPY;  Service: Gastroenterology;  Laterality: N/A;   ESOPHAGOGASTRODUODENOSCOPY (EGD)  WITH PROPOFOL N/A 10/11/2019   Procedure: ESOPHAGOGASTRODUODENOSCOPY (EGD) WITH PROPOFOL;  Surgeon: Virgel Manifold, MD;  Location: [facility] ENDOSCOPY;  Service: Endoscopy;  Laterality: N/A;   ESOPHAGOGASTRODUODENOSCOPY (EGD) WITH PROPOFOL N/A 01/17/2020   Procedure: ESOPHAGOGASTRODUODENOSCOPY (EGD) WITH PROPOFOL;  Surgeon: Lin Landsman, MD;  Location: [facility] ENDOSCOPY;  Service: Gastroenterology;  Laterality: N/A;   TONSILLECTOMY     Family History  Problem Relation Age of Onset   Heart Problems Mother    Seizures Father    Social History   Tobacco Use   Smoking status: Former    Types: Cigarettes   Smokeless tobacco: Never  Substance Use Topics   Alcohol use: Not Currently   Allergies  Allergen Reactions   Aspirin Nausea Only and Other (See Comments)    GI upset   Penicillins Rash     Review of Systems  Constitutional:  Positive for fatigue. Negative for appetite change.  HENT:  Negative for congestion, postnasal drip and sore throat.   Eyes: Negative.   Respiratory:  Positive for cough and shortness of breath.   Cardiovascular:  Negative for chest pain, palpitations and leg swelling.  Gastrointestinal:  Negative for abdominal distention and abdominal pain.  Endocrine: Negative.   Genitourinary: Negative.   Musculoskeletal:  Positive for back pain. Negative for neck pain.  Skin: Negative.   Allergic/Immunologic: Negative.   Neurological:  Positive for dizziness. Negative for light-headedness.  Hematological:  Negative for adenopathy. Does not bruise/bleed easily.  Psychiatric/Behavioral:  Positive for sleep disturbance (sleeping on 3 pillows with oxygen at 2.5L). Negative for dysphoric mood. The patient is not nervous/anxious.      Physical Exam Vitals and nursing note reviewed. Exam conducted with a chaperone present (friend Sonia Side).  Constitutional:      Appearance: Normal appearance.  HENT:     Head: Normocephalic and atraumatic.  Cardiovascular:     Rate  and Rhythm: Normal rate and regular rhythm.  Pulmonary:     Effort: Pulmonary effort is normal. No respiratory distress.     Breath sounds: No wheezing or rales.  Abdominal:     General: There is no distension.     Palpations: Abdomen is soft.  Musculoskeletal:        General: No tenderness.     Cervical back: Normal range of motion and neck supple.     Right lower leg: Edema (trace pitting) present.     Left lower leg: Edema (trace pitting) present.  Skin:    General: Skin is warm and dry.  Neurological:     General: No focal deficit present.     Mental Status: She is alert and oriented to person, place, and time.  Psychiatric:        Mood and Affect: Mood normal.        Behavior: Behavior normal.        Thought Content: Thought content normal.     Assessment and Plan:  Chronic heart failure with preserved ejection fraction with structural changes (LVH)- - NYHA II today - euvolemic - weighing daily; reminded to weigh daily and report weight gain  of > 3lb/night or 5 lbs/week - weight 209.6 pounds from last visit here 2 months ago - discussed fluid restriction, < 64 ounces/day, again, as she's drinking too much fluids  - using NoSalt as her seasoning - NS with cardiology Kathlen Mody) on 11/27/21 - on GDMT of jardiance  - metoprolol resumed at last visit - consider adding entresto  - BNP 10/13/21 was 8.4  2. HTN - - BP - needs to schedule f/u appt with [facility]  - BMP 02/11/22 reviewed and showed sodium 141, potassium 3.8, creatinine 1.5 and GFR 39  3. Tobacco abuse - - smoking 2 cigarettes/day - encouraged complete cessation  4: DM- - A1c 10/14/21 was 7.2%    Medication bottles were brought and individually reviewed.

## 2022-04-11 ENCOUNTER — Other Ambulatory Visit: Payer: Self-pay | Admitting: Cardiovascular Disease

## 2022-04-13 ENCOUNTER — Ambulatory Visit: Payer: Medicaid Other | Admitting: Family

## 2022-04-13 ENCOUNTER — Telehealth: Payer: Self-pay | Admitting: Family

## 2022-04-13 MED ORDER — FUROSEMIDE 40 MG PO TABS: 40.0000 mg | ORAL_TABLET | Freq: Every day | ORAL | 1 refills | Status: DC

## 2022-04-13 NOTE — Telephone Encounter (Signed)
Patient called saying that her abdomen is distended where she looks "7 months pregnant" and she's more fatigued than usual. Weight has ranged from 207-209 pounds. Has been taking '20mg'$  furosemide since she started jardiance.   Difficult getting a clear timeline but she feels like her abdominal distention started when she started taking jardiance. She didn't realize that she had an appointment that she missed earlier today.   Explained that if she was that swollen, she may need to go to the ED but she says that she wouldn't have the money to get back home. Advised her to stop taking jardiance and increase her furosemide to '40mg'$  daily.   Patient and friend who was also on the phone verbalized understanding. If symptoms worsen, she is to present to the ED. Did r/s her appointment that she missed.

## 2022-04-13 NOTE — Telephone Encounter (Signed)
Patient did not show for her [facility] appointment on 04/13/22 Will attempt to reschedule.

## 2022-04-14 ENCOUNTER — Other Ambulatory Visit: Payer: Self-pay | Admitting: Cardiovascular Disease

## 2022-04-24 ENCOUNTER — Encounter: Payer: Self-pay | Admitting: *Deleted

## 2022-04-30 ENCOUNTER — Ambulatory Visit: Payer: Medicaid Other | Admitting: Medical

## 2022-04-30 ENCOUNTER — Encounter: Payer: Self-pay | Admitting: Medical

## 2022-04-30 NOTE — Progress Notes (Deleted)
Cardiology Office Note:    Date:  04/30/2022   ID:  [name], DOB 04-21-1961, MRN [id number]  PCP:  [facility] [place] Cardiologist:  None  [facility] Electrophysiologist:  None   Referring MD: [facility] [place]   Chief Complaint: 4 week follow-up  History of Present Illness:    Jaime Fitzgerald is a 61 y.o. female with a hx of HTN, previous GI bleed with blood loss anemia, astham/COPD, toabcco use, DM2, OSA, obesity, HFmrEF.   She was hospitalized earlier this yar with shortness of breath which started after ac old and URI. CTA showed caridomegaly with small pericardial effusion. Echo showed EF 45-50% with G3DD. The patient improved with diuresis but continued to be hypoxic and was discharged on 2L O2.   Last seen 10/30/21 reporting weight loss over the last few years. She was volume up and lasix was increased to '40mg'$  daily. Limited echo was repeated.   Cardiac amyloidosis  Today,  Past Medical History:  Diagnosis Date   Acute blood loss anemia 10/11/2019   Acute GI bleeding 10/11/2019   Anginal pain (HCC)    Arthritis    Asthma    CHF (congestive heart failure) (HCC)    Diabetes mellitus without complication (HCC)    Dyspnea    Hypertension    Seizures ([place])    Sleep apnea     Past Surgical History:  Procedure Laterality Date   CARPAL TUNNEL RELEASE     COLONOSCOPY WITH PROPOFOL N/A 01/17/2020   Procedure: COLONOSCOPY WITH PROPOFOL;  Surgeon: Lin Landsman, MD;  Location: [facility] ENDOSCOPY;  Service: Gastroenterology;  Laterality: N/A;   ESOPHAGOGASTRODUODENOSCOPY N/A 10/13/2019   Procedure: ESOPHAGOGASTRODUODENOSCOPY (EGD);  Surgeon: Virgel Manifold, MD;  Location: [facility] ENDOSCOPY;  Service: Endoscopy;  Laterality: N/A;   ESOPHAGOGASTRODUODENOSCOPY N/A 09/18/2020   Procedure: ESOPHAGOGASTRODUODENOSCOPY (EGD);  Surgeon: Toledo, Benay Pike, MD;  Location: [facility] ENDOSCOPY;  Service: Gastroenterology;  Laterality: N/A;    ESOPHAGOGASTRODUODENOSCOPY (EGD) WITH PROPOFOL N/A 10/11/2019   Procedure: ESOPHAGOGASTRODUODENOSCOPY (EGD) WITH PROPOFOL;  Surgeon: Virgel Manifold, MD;  Location: [facility] ENDOSCOPY;  Service: Endoscopy;  Laterality: N/A;   ESOPHAGOGASTRODUODENOSCOPY (EGD) WITH PROPOFOL N/A 01/17/2020   Procedure: ESOPHAGOGASTRODUODENOSCOPY (EGD) WITH PROPOFOL;  Surgeon: Lin Landsman, MD;  Location: [facility] ENDOSCOPY;  Service: Gastroenterology;  Laterality: N/A;   TONSILLECTOMY      Current Medications: No outpatient medications have been marked as taking for the 04/30/22 encounter (Appointment) with Kathlen Mody, Kaitlinn Iversen H, PA-C.     Allergies:   Aspirin and Penicillins   Social History   Socioeconomic History   Marital status: Legally Separated    Spouse name: Not on file   Number of children: Not on file   Years of education: Not on file   Highest education level: Not on file  Occupational History   Not on file  Tobacco Use   Smoking status: Former    Types: Cigarettes   Smokeless tobacco: Never  Substance and Sexual Activity   Alcohol use: Not Currently   Drug use: Not Currently   Sexual activity: Not on file  Other Topics Concern   Not on file  Social History Narrative   Not on file   Social Determinants of Health   Financial Resource Strain: Not on file  Food Insecurity: Not on file  Transportation Needs: Not on file  Physical Activity: Not on file  Stress: Not on file  Social Connections: Not on file  Family History: The patient's family history includes Heart Problems in her mother; Seizures in her father.  ROS:   Please see the history of present illness.     All other systems reviewed and are negative.  EKGs/Labs/Other Studies Reviewed:    The following studies were reviewed today:  Limited echo 11/2021 1. Left ventricular ejection fraction, by estimation, is 60 to 65%. Left  ventricular ejection fraction by 2D MOD biplane is 65.1 %. The left  ventricle has  normal function. The left ventricle has no regional wall  motion abnormalities. There is mild left  ventricular hypertrophy.   2. Right ventricular systolic function is normal.   3. There is no evidence of cardiac tamponade.   4. The mitral valve is normal in structure. Trivial mitral valve  regurgitation.   5. Aortic valve regurgitation is not visualized.   6. The inferior vena cava is normal in size with greater than 50%  respiratory variability, suggesting right atrial pressure of 3 mmHg.   Comparison(s): LVEF 45-50%.   Echo 10/2021   1. Left ventricular ejection fraction, by estimation, is 45 to 50%. The  left ventricle has mildly decreased function. The left ventricle has no  regional wall motion abnormalities. Left ventricular diastolic parameters  are consistent with Grade III  diastolic dysfunction (restrictive).   2. Right ventricular systolic function is moderately reduced. The right  ventricular size is moderately enlarged.   3. Left atrial size was mildly dilated.   4. Right atrial size was mildly dilated.   5. The mitral valve is normal in structure. Trivial mitral valve  regurgitation. No evidence of mitral stenosis.   6. The aortic valve is normal in structure. Aortic valve regurgitation is  not visualized. No aortic stenosis is present.   7. The inferior vena cava is normal in size with greater than 50%  respiratory variability, suggesting right atrial pressure of 3 mmHg.   EKG:  EKG is *** ordered today.  The ekg ordered today demonstrates ***  Recent Labs: 10/13/2021: ALT 11; B Natriuretic Peptide 8.4 10/14/2021: TSH 1.955 10/17/2021: Hemoglobin 16.0; Magnesium 2.2; Platelets 217 02/11/2022: BUN 26; Creatinine, Ser 1.50; Potassium 3.8; Sodium 141  Recent Lipid Panel No results found for: "CHOL", "TRIG", "HDL", "CHOLHDL", "VLDL", "LDLCALC", "LDLDIRECT"   Risk Assessment/Calculations:   {Does this patient have ATRIAL FIBRILLATION?:[id number]}   Physical Exam:     VS:  There were no vitals taken for this visit.    Wt Readings from Last 3 Encounters:  02/11/22 209 lb 6 oz (95 kg)  01/01/22 216 lb 8 oz (98.2 kg)  11/25/21 212 lb 5 oz (96.3 kg)     GEN: *** Well nourished, well developed in no acute distress HEENT: Normal NECK: No JVD; No carotid bruits LYMPHATICS: No lymphadenopathy CARDIAC: ***RRR, no murmurs, rubs, gallops RESPIRATORY:  Clear to auscultation without rales, wheezing or rhonchi  ABDOMEN: Soft, non-tender, non-distended MUSCULOSKELETAL:  No edema; No deformity  SKIN: Warm and dry NEUROLOGIC:  Alert and oriented x 3 PSYCHIATRIC:  Normal affect   ASSESSMENT:    No diagnosis found. PLAN:    In order of problems listed above:  Chronic diastolic heart failure  Moderate size pericardial effusion  HTN  HLD  Disposition: Follow up {follow up:15908} with ***   Shared Decision Making/Informed Consent   {Are you ordering a CV Procedure (e.g. stress test, cath, DCCV, TEE, etc)?   Press F2        :[id number]}    Signed, Alfons Sulkowski H  Jorene Minors  04/30/2022 8:07 AM    [facility]

## 2022-05-01 ENCOUNTER — Ambulatory Visit: Payer: 59 | Admitting: Family

## 2022-05-22 ENCOUNTER — Other Ambulatory Visit: Payer: Self-pay | Admitting: Family

## 2022-06-01 ENCOUNTER — Telehealth: Payer: Self-pay | Admitting: Family

## 2022-06-01 ENCOUNTER — Ambulatory Visit: Payer: Self-pay | Admitting: Family

## 2022-06-01 NOTE — Progress Notes (Deleted)
Patient ID: Jaime Fitzgerald, female    DOB: [birth year], 61 y.o.   MRN: [id number]  HPI  Jaime Fitzgerald is a 61 year old female with a past medical history of hypertension, acute blood loss anemia with history of GI bleed, asthma, diabetes mellitus type 2, seizures, sleep apnea, dyspnea, obesity.  Echo report from 11/26/21 reviewed and showed an EF of 60-65% along with mild LVH. Echo report from 10/14/21 reviewed and showed: EF of 45-50% with moderate LV dysfunction, GIII restrictive. Repeat echo scheduled on 11/26/21 by Dr. Fletcher Anon as his review of the echo did not match the above interpretation.   Admitted 10/13/21-10/17/21 with SOB, acute hypoxic respiratory failure, acute combined HF exacerbation and AKI.  She presents today for a follow-up visit with a chief complaint of minimal fatigue upon moderate exertion. She describes this as chronic in nature. She has associated cough, shortness of breath, dizziness, chronic difficulty sleeping and chronic back pain. She denies any abdominal distention, palpitations, pedal edema, chest pain or weight gain.   She hasn't taken her furosemide yet today but is planning on taking it when she returns home today. Says that she has been out of her metoprolol for the last month.   She says that she wears her CPAP at night but has great difficulty in putting it on by herself. She lives in assisted housing and needs a letter saying that her friend can stay with her at night to assist with getting her CPAP on and monitoring it.   Past Medical History:  Diagnosis Date   Acute blood loss anemia 10/11/2019   Acute GI bleeding 10/11/2019   Anginal pain (HCC)    Arthritis    Asthma    CHF (congestive heart failure) (HCC)    Diabetes mellitus without complication (HCC)    Dyspnea    Hypertension    Seizures ([place])    Sleep apnea    Past Surgical History:  Procedure Laterality Date   CARPAL TUNNEL RELEASE     COLONOSCOPY WITH PROPOFOL N/A 01/17/2020   Procedure:  COLONOSCOPY WITH PROPOFOL;  Surgeon: Lin Landsman, MD;  Location: [facility] ENDOSCOPY;  Service: Gastroenterology;  Laterality: N/A;   ESOPHAGOGASTRODUODENOSCOPY N/A 10/13/2019   Procedure: ESOPHAGOGASTRODUODENOSCOPY (EGD);  Surgeon: Virgel Manifold, MD;  Location[facility] ENDOSCOPY;  Service: Endoscopy;  Laterality: N/A;   ESOPHAGOGASTRODUODENOSCOPY N/A 09/18/2020   Procedure: ESOPHAGOGASTRODUODENOSCOPY (EGD);  Surgeon: Toledo, Benay Pike, MD;  Location: [facility] ENDOSCOPY;  Service: Gastroenterology;  Laterality: N/A;   ESOPHAGOGASTRODUODENOSCOPY (EGD) WITH PROPOFOL N/A 10/11/2019   Procedure: ESOPHAGOGASTRODUODENOSCOPY (EGD) WITH PROPOFOL;  Surgeon: Virgel Manifold, MD;  Location: [facility] ENDOSCOPY;  Service: Endoscopy;  Laterality: N/A;   ESOPHAGOGASTRODUODENOSCOPY (EGD) WITH PROPOFOL N/A 01/17/2020   Procedure: ESOPHAGOGASTRODUODENOSCOPY (EGD) WITH PROPOFOL;  Surgeon: Lin Landsman, MD;  Location: [facility] ENDOSCOPY;  Service: Gastroenterology;  Laterality: N/A;   TONSILLECTOMY     Family History  Problem Relation Age of Onset   Heart Problems Mother    Seizures Father    Social History   Tobacco Use   Smoking status: Former    Types: Cigarettes   Smokeless tobacco: Never  Substance Use Topics   Alcohol use: Not Currently   Allergies  Allergen Reactions   Aspirin Nausea Only and Other (See Comments)    GI upset   Penicillins Rash   Past Medical History:  Diagnosis Date   Acute blood loss anemia 10/11/2019   Acute GI bleeding 10/11/2019   Anginal pain ([place])  Arthritis    Asthma    CHF (congestive heart failure) (HCC)    Diabetes mellitus without complication (HCC)    Dyspnea    Hypertension    Seizures ([place])    Sleep apnea    Past Surgical History:  Procedure Laterality Date   CARPAL TUNNEL RELEASE     COLONOSCOPY WITH PROPOFOL N/A 01/17/2020   Procedure: COLONOSCOPY WITH PROPOFOL;  Surgeon: Lin Landsman, MD;  Location: [facility] ENDOSCOPY;  Service:  Gastroenterology;  Laterality: N/A;   ESOPHAGOGASTRODUODENOSCOPY N/A 10/13/2019   Procedure: ESOPHAGOGASTRODUODENOSCOPY (EGD);  Surgeon: Virgel Manifold, MD;  Location: [facility] ENDOSCOPY;  Service: Endoscopy;  Laterality: N/A;   ESOPHAGOGASTRODUODENOSCOPY N/A 09/18/2020   Procedure: ESOPHAGOGASTRODUODENOSCOPY (EGD);  Surgeon: Toledo, Benay Pike, MD;  Location: [facility] ENDOSCOPY;  Service: Gastroenterology;  Laterality: N/A;   ESOPHAGOGASTRODUODENOSCOPY (EGD) WITH PROPOFOL N/A 10/11/2019   Procedure: ESOPHAGOGASTRODUODENOSCOPY (EGD) WITH PROPOFOL;  Surgeon: Virgel Manifold, MD;  Location: [facility] ENDOSCOPY;  Service: Endoscopy;  Laterality: N/A;   ESOPHAGOGASTRODUODENOSCOPY (EGD) WITH PROPOFOL N/A 01/17/2020   Procedure: ESOPHAGOGASTRODUODENOSCOPY (EGD) WITH PROPOFOL;  Surgeon: Lin Landsman, MD;  Location: [facility] ENDOSCOPY;  Service: Gastroenterology;  Laterality: N/A;   TONSILLECTOMY     Family History  Problem Relation Age of Onset   Heart Problems Mother    Seizures Father    Social History   Tobacco Use   Smoking status: Former    Types: Cigarettes   Smokeless tobacco: Never  Substance Use Topics   Alcohol use: Not Currently   Allergies  Allergen Reactions   Aspirin Nausea Only and Other (See Comments)    GI upset   Penicillins Rash   Prior to Admission medications   Medication Sig Start Date End Date Taking? Authorizing Provider  albuterol (VENTOLIN HFA) 108 (90 Base) MCG/ACT inhaler Inhale 2 puffs into the lungs every 6 (six) hours as needed for wheezing or shortness of breath. 10/14/19  Yes Nolberto Hanlon, MD  amLODipine (NORVASC) 5 MG tablet Take 1 tablet (5 mg total) by mouth daily. 10/30/21  Yes Wellington Hampshire, MD  cyclobenzaprine (FLEXERIL) 10 MG tablet Take 10 mg by mouth as needed for muscle spasms.   Yes [provider]  empagliflozin (JARDIANCE) 10 MG TABS tablet Take 1 tablet (10 mg total) by mouth daily before breakfast. 01/05/22  Yes Hackney, Tina A, FNP   Fluticasone Propionate, Inhal, (FLOVENT IN) Inhale into the lungs as needed.   Yes [provider]  furosemide (LASIX) 40 MG tablet Take 1 tablet (40 mg total) by mouth daily. 10/30/21  Yes Wellington Hampshire, MD  glipiZIDE (GLUCOTROL XL) 10 MG 24 hr tablet Take 10 mg by mouth 2 (two) times daily. 10/27/21  Yes [provider]  pantoprazole (PROTONIX) 40 MG tablet Take 1 tablet (40 mg total) by mouth daily. 10/17/21  Yes Sreenath, Sudheer B, MD  pravastatin (PRAVACHOL) 40 MG tablet Take 1 tablet (40 mg total) by mouth daily. 01/05/22  Yes Hackney, Otila Kluver A, FNP  vitamin B-12 (CYANOCOBALAMIN) 1000 MCG tablet Take 1 tablet (1,000 mcg total) by mouth daily. 08/05/21  Yes Vanga, Tally Due, MD  witch hazel-glycerin (TUCKS) pad Apply topically as needed for itching. 10/17/21  Yes Sreenath, Trula Slade, MD  meclizine (ANTIVERT) 12.5 MG tablet Take 1 tablet (12.5 mg total) by mouth 3 (three) times daily as needed for up to 30 doses for dizziness. Patient not taking: Reported on 02/11/2022 09/20/20   Nicole Kindred A, DO  metoprolol succinate (TOPROL XL) 25 MG 24  hr tablet Take 1 tablet (25 mg total) by mouth daily. Patient not taking: Reported on 02/11/2022 10/17/21   Sidney Ace, MD    Review of Systems  Constitutional:  Positive for fatigue. Negative for appetite change.  HENT:  Negative for congestion, postnasal drip and sore throat.   Eyes: Negative.   Respiratory:  Positive for cough and shortness of breath.   Cardiovascular:  Negative for chest pain, palpitations and leg swelling.  Gastrointestinal:  Negative for abdominal distention and abdominal pain.  Endocrine: Negative.   Genitourinary: Negative.   Musculoskeletal:  Positive for back pain. Negative for neck pain.  Skin: Negative.   Allergic/Immunologic: Negative.   Neurological:  Positive for dizziness. Negative for light-headedness.  Hematological:  Negative for adenopathy. Does not bruise/bleed easily.   Psychiatric/Behavioral:  Positive for sleep disturbance (sleeping on 3 pillows with oxygen at 2.5L). Negative for dysphoric mood. The patient is not nervous/anxious.     There were no vitals filed for this visit.  Wt Readings from Last 3 Encounters:  02/11/22 209 lb 6 oz (95 kg)  01/01/22 216 lb 8 oz (98.2 kg)  11/25/21 212 lb 5 oz (96.3 kg)   Lab Results  Component Value Date   CREATININE 1.50 (H) 02/11/2022   CREATININE 0.99 11/26/2021   CREATININE 1.05 (H) 11/25/2021   Physical Exam Vitals and nursing note reviewed. Exam conducted with a chaperone present (friend Sonia Side).  Constitutional:      Appearance: Normal appearance.  HENT:     Head: Normocephalic and atraumatic.  Cardiovascular:     Rate and Rhythm: Normal rate and regular rhythm.  Pulmonary:     Effort: Pulmonary effort is normal. No respiratory distress.     Breath sounds: No wheezing or rales.  Abdominal:     General: There is no distension.     Palpations: Abdomen is soft.  Musculoskeletal:        General: No tenderness.     Cervical back: Normal range of motion and neck supple.     Right lower leg: Edema (trace pitting) present.     Left lower leg: Edema (trace pitting) present.  Skin:    General: Skin is warm and dry.  Neurological:     General: No focal deficit present.     Mental Status: She is alert and oriented to person, place, and time.  Psychiatric:        Mood and Affect: Mood normal.        Behavior: Behavior normal.        Thought Content: Thought content normal.     Assessment and Plan:  Chronic heart failure with preserved ejection fraction with structural changes (LVH)- - NYHA II today - euvolemic - weighing daily; reminded to weigh daily and report weight gain of > 3lb/night or 5 lbs/week - weight down 7 pounds from last visit here 5 weeks ago - discussed fluid restriction, < 64 ounces/day, again, as she's drinking too much fluids  - using NoSalt as her seasoning - NS with  cardiology Kathlen Mody) on 11/27/21 - on GDMT of jardiance - consider adding entresto - BNP 10/13/21 was 8.4  2. HTN - - BP looks good () - needs to schedule f/u appt with [facility]  - BMP 02/11/22 reviewed and showed sodium 141, potassium 3.8, creatinine 1.50 and GFR 39  3. Tobacco abuse - - smoking 2 cigarettes/day - encouraged complete cessation  4: DM- - A1c 10/14/21 was 7.2%   Medication bottles were  brought and individually reviewed. Letter provided today stating that she needs assistance with her CPAP.   Return in  months, sooner if needed.

## 2022-06-01 NOTE — Telephone Encounter (Signed)
Patient did not show for her [facility] appointment on 06/01/22 even after confirming the appointment. Will attempt to reschedule.

## 2022-06-01 NOTE — Telephone Encounter (Signed)
Spoke to patient on 8/27 who confirmed her appointment for today and she proceeded to tell me her transportation issues and picking up her medications. Patient then arrived today on 8/28 two hours late for her appointmemt stating she changed her appointment time and that it was our fault which is not accurate as well as acta needs 3 days notice for transportation that must have been the time she told them. I rescheduled her appointment and for end of September and gave patient a 25$ visa gift card to help her with her previous troubles stated and apologized for the miss communication as she was very upset.   Javari Bufkin, NT

## 2022-06-16 NOTE — Progress Notes (Deleted)
Cardiology Office Note:    Date:  06/16/2022   ID:  Jaime Fitzgerald, DOB 1961-01-15, MRN [id number]  PCP:  [facility] [place] Cardiologist:  None  [facility] Electrophysiologist:  None   Referring MD: [facility], [place]   Chief Complaint: 4 week follow-up  History of Present Illness:    Jaime Fitzgerald is a 61 y.o. female with a hx of HTN, previous GIB with blood loss anemia, asthma/COPD, tobacco use, DM2, OSA and obesity who presents for CHF follow-up.   She was hospitalized at [facility] with increased SOB which started after a cold and upper respiratory tract infection. CTA showed cardiomegaly with small pericardial effusion. She had an echocardiogram showing LVEF 45-50%, G3DD. The patient improved with diuresis but continued to be hypoxic and this she was discharged home on 2L O2. Echo was reviewed by the MD and it was LVEF was normal with mild LVH, moderate size pericardial effusion and G1DD.   Last seen 10/2021 was still volume overloaded. Lasix was increased to '40mg'$  daily. Repeat limited echocardiogram, this showed normal LVEF with trivial pericardial effusion and no tamponade.   Today,     Past Medical History:  Diagnosis Date   Acute blood loss anemia 10/11/2019   Acute GI bleeding 10/11/2019   Anginal pain (HCC)    Arthritis    Asthma    CHF (congestive heart failure) (HCC)    Diabetes mellitus without complication (HCC)    Dyspnea    Hypertension    Seizures ([place])    Sleep apnea     Past Surgical History:  Procedure Laterality Date   CARPAL TUNNEL RELEASE     COLONOSCOPY WITH PROPOFOL N/A 01/17/2020   Procedure: COLONOSCOPY WITH PROPOFOL;  Surgeon: Lin Landsman, MD;  Location: [facility] ENDOSCOPY;  Service: Gastroenterology;  Laterality: N/A;   ESOPHAGOGASTRODUODENOSCOPY N/A 10/13/2019   Procedure: ESOPHAGOGASTRODUODENOSCOPY (EGD);  Surgeon: Virgel Manifold, MD;  Location: [facility] ENDOSCOPY;  Service: Endoscopy;   Laterality: N/A;   ESOPHAGOGASTRODUODENOSCOPY N/A 09/18/2020   Procedure: ESOPHAGOGASTRODUODENOSCOPY (EGD);  Surgeon: Toledo, Benay Pike, MD;  Location: [facility] ENDOSCOPY;  Service: Gastroenterology;  Laterality: N/A;   ESOPHAGOGASTRODUODENOSCOPY (EGD) WITH PROPOFOL N/A 10/11/2019   Procedure: ESOPHAGOGASTRODUODENOSCOPY (EGD) WITH PROPOFOL;  Surgeon: Virgel Manifold, MD;  Location: [facility] ENDOSCOPY;  Service: Endoscopy;  Laterality: N/A;   ESOPHAGOGASTRODUODENOSCOPY (EGD) WITH PROPOFOL N/A 01/17/2020   Procedure: ESOPHAGOGASTRODUODENOSCOPY (EGD) WITH PROPOFOL;  Surgeon: Lin Landsman, MD;  Location: [facility] ENDOSCOPY;  Service: Gastroenterology;  Laterality: N/A;   TONSILLECTOMY      Current Medications: No outpatient medications have been marked as taking for the 06/17/22 encounter (Appointment) with Kathlen Mody, Elidia Bonenfant H, PA-C.     Allergies:   Aspirin and Penicillins   Social History   Socioeconomic History   Marital status: Legally Separated    Spouse name: Not on file   Number of children: Not on file   Years of education: Not on file   Highest education level: Not on file  Occupational History   Not on file  Tobacco Use   Smoking status: Former    Types: Cigarettes   Smokeless tobacco: Never  Substance and Sexual Activity   Alcohol use: Not Currently   Drug use: Not Currently   Sexual activity: Not on file  Other Topics Concern   Not on file  Social History Narrative   Not on file   Social Determinants of Health   Financial Resource Strain: Not on file  Food Insecurity: Not on file  Transportation Needs: Not on file  Physical Activity: Not on file  Stress: Not on file  Social Connections: Not on file     Family History: The patient's family history includes Heart Problems in her mother; Seizures in her father.  ROS:   Please see the history of present illness.     All other systems reviewed and are negative.  EKGs/Labs/Other Studies Reviewed:    The following  studies were reviewed today:  Echo limited 11/2021  1. Left ventricular ejection fraction, by estimation, is 60 to 65%. Left  ventricular ejection fraction by 2D MOD biplane is 65.1 %. The left  ventricle has normal function. The left ventricle has no regional wall  motion abnormalities. There is mild left  ventricular hypertrophy.   2. Right ventricular systolic function is normal.   3. There is no evidence of cardiac tamponade.   4. The mitral valve is normal in structure. Trivial mitral valve  regurgitation.   5. Aortic valve regurgitation is not visualized.   6. The inferior vena cava is normal in size with greater than 50%  respiratory variability, suggesting right atrial pressure of 3 mmHg.   Comparison(s): LVEF 45-50%.   Echo 10/2021 1. Left ventricular ejection fraction, by estimation, is 45 to 50%. The  left ventricle has mildly decreased function. The left ventricle has no  regional wall motion abnormalities. Left ventricular diastolic parameters  are consistent with Grade III  diastolic dysfunction (restrictive).   2. Right ventricular systolic function is moderately reduced. The right  ventricular size is moderately enlarged.   3. Left atrial size was mildly dilated.   4. Right atrial size was mildly dilated.   5. The mitral valve is normal in structure. Trivial mitral valve  regurgitation. No evidence of mitral stenosis.   6. The aortic valve is normal in structure. Aortic valve regurgitation is  not visualized. No aortic stenosis is present.   7. The inferior vena cava is normal in size with greater than 50%  respiratory variability, suggesting right atrial pressure of 3 mmHg.   EKG:  EKG is *** ordered today.  The ekg ordered today demonstrates ***  Recent Labs: 10/13/2021: ALT 11; B Natriuretic Peptide 8.4 10/14/2021: TSH 1.955 10/17/2021: Hemoglobin 16.0; Magnesium 2.2; Platelets 217 02/11/2022: BUN 26; Creatinine, Ser 1.50; Potassium 3.8; Sodium 141  Recent Lipid  Panel No results found for: "CHOL", "TRIG", "HDL", "CHOLHDL", "VLDL", "LDLCALC", "LDLDIRECT"   Risk Assessment/Calculations:   {Does this patient have ATRIAL FIBRILLATION?:[id number]}   Physical Exam:    VS:  There were no vitals taken for this visit.    Wt Readings from Last 3 Encounters:  02/11/22 209 lb 6 oz (95 kg)  01/01/22 216 lb 8 oz (98.2 kg)  11/25/21 212 lb 5 oz (96.3 kg)     GEN: *** Well nourished, well developed in no acute distress HEENT: Normal NECK: No JVD; No carotid bruits LYMPHATICS: No lymphadenopathy CARDIAC: ***RRR, no murmurs, rubs, gallops RESPIRATORY:  Clear to auscultation without rales, wheezing or rhonchi  ABDOMEN: Soft, non-tender, non-distended MUSCULOSKELETAL:  No edema; No deformity  SKIN: Warm and dry NEUROLOGIC:  Alert and oriented x 3 PSYCHIATRIC:  Normal affect   ASSESSMENT:    No diagnosis found. PLAN:    In order of problems listed above:  Chronic diastolic heart failure  Moderate size pericardial effusion  HTN  HLD  Disposition: Follow up {follow up:15908} with ***   Shared Decision Making/Informed Consent   {  Are you ordering a CV Procedure (e.g. stress test, cath, DCCV, TEE, etc)?   Press F2        :[id number]}    Signed, Bianca Raneri Arlyss Repress  06/16/2022 3:57 PM    [facility]

## 2022-06-17 ENCOUNTER — Ambulatory Visit: Payer: Medicaid Other | Attending: Medical | Admitting: Medical

## 2022-06-19 ENCOUNTER — Telehealth: Payer: Self-pay | Admitting: Family

## 2022-06-19 ENCOUNTER — Ambulatory Visit: Payer: Medicaid Other | Admitting: Family

## 2022-06-19 NOTE — Telephone Encounter (Signed)
Patient did not show for her Heart Failure Clinic appointment on 06/19/22. Will attempt to reschedule.

## 2022-06-19 NOTE — Progress Notes (Deleted)
Patient ID: Jaime Fitzgerald, female    DOB: October 22, 1960, 61 y.o.   MRN: [id number]  HPI  Jaime Fitzgerald is a 61 year old female with a past medical history of hypertension, acute blood loss anemia with history of GI bleed, asthma, diabetes mellitus type 2, seizures, sleep apnea, dyspnea, obesity.  Echo report from 11/26/21 reviewed and showed an EF of 60-65% along with mild LVH. Echo report from 10/14/21 reviewed and showed: EF of 45-50% with moderate LV dysfunction, GIII restrictive. Repeat echo scheduled on 11/26/21 by Dr. Fletcher Anon as his review of the echo did not match the above interpretation.   Admitted 10/13/21-10/17/21 with SOB, acute hypoxic respiratory failure, acute combined HF exacerbation and AKI.  She presents today for a follow-up visit with a chief complaint of minimal fatigue upon moderate exertion. She describes this as chronic in nature. She has associated cough, shortness of breath, dizziness, chronic difficulty sleeping and chronic back pain. She denies any abdominal distention, palpitations, pedal edema, chest pain or weight gain.   She hasn't taken her furosemide yet today but is planning on taking it when she returns home today. Says that she has been out of her metoprolol for the last month.   She says that she wears her CPAP at night but has great difficulty in putting it on by herself. She lives in assisted housing and needs a letter saying that her friend can stay with her at night to assist with getting her CPAP on and monitoring it.   Past Medical History:  Diagnosis Date   Acute blood loss anemia 10/11/2019   Acute GI bleeding 10/11/2019   Anginal pain (HCC)    Arthritis    Asthma    CHF (congestive heart failure) (HCC)    Diabetes mellitus without complication (HCC)    Dyspnea    Hypertension    Seizures ([place])    Sleep apnea    Past Surgical History:  Procedure Laterality Date   CARPAL TUNNEL RELEASE     COLONOSCOPY WITH PROPOFOL N/A 01/17/2020   Procedure:  COLONOSCOPY WITH PROPOFOL;  Surgeon: Lin Landsman, MD;  Location: [facility] ENDOSCOPY;  Service: Gastroenterology;  Laterality: N/A;   ESOPHAGOGASTRODUODENOSCOPY N/A 10/13/2019   Procedure: ESOPHAGOGASTRODUODENOSCOPY (EGD);  Surgeon: Virgel Manifold, MD;  Location: [facility] ENDOSCOPY;  Service: Endoscopy;  Laterality: N/A;   ESOPHAGOGASTRODUODENOSCOPY N/A 09/18/2020   Procedure: ESOPHAGOGASTRODUODENOSCOPY (EGD);  Surgeon: Toledo, Benay Pike, MD;  Location: [facility] ENDOSCOPY;  Service: Gastroenterology;  Laterality: N/A;   ESOPHAGOGASTRODUODENOSCOPY (EGD) WITH PROPOFOL N/A 10/11/2019   Procedure: ESOPHAGOGASTRODUODENOSCOPY (EGD) WITH PROPOFOL;  Surgeon: Virgel Manifold, MD;  Location: [facility] ENDOSCOPY;  Service: Endoscopy;  Laterality: N/A;   ESOPHAGOGASTRODUODENOSCOPY (EGD) WITH PROPOFOL N/A 01/17/2020   Procedure: ESOPHAGOGASTRODUODENOSCOPY (EGD) WITH PROPOFOL;  Surgeon: Lin Landsman, MD;  Location: [facility] ENDOSCOPY;  Service: Gastroenterology;  Laterality: N/A;   TONSILLECTOMY     Family History  Problem Relation Age of Onset   Heart Problems Mother    Seizures Father    Social History   Tobacco Use   Smoking status: Former    Types: Cigarettes   Smokeless tobacco: Never  Substance Use Topics   Alcohol use: Not Currently   Allergies  Allergen Reactions   Aspirin Nausea Only and Other (See Comments)    GI upset   Penicillins Rash   Past Medical History:  Diagnosis Date   Acute blood loss anemia 10/11/2019   Acute GI bleeding 10/11/2019   Anginal pain ([place])  Arthritis    Asthma    CHF (congestive heart failure) (HCC)    Diabetes mellitus without complication (HCC)    Dyspnea    Hypertension    Seizures ([place])    Sleep apnea    Past Surgical History:  Procedure Laterality Date   CARPAL TUNNEL RELEASE     COLONOSCOPY WITH PROPOFOL N/A 01/17/2020   Procedure: COLONOSCOPY WITH PROPOFOL;  Surgeon: Lin Landsman, MD;  Location: [facility] ENDOSCOPY;  Service:  Gastroenterology;  Laterality: N/A;   ESOPHAGOGASTRODUODENOSCOPY N/A 10/13/2019   Procedure: ESOPHAGOGASTRODUODENOSCOPY (EGD);  Surgeon: Virgel Manifold, MD;  Location: [facility] ENDOSCOPY;  Service: Endoscopy;  Laterality: N/A;   ESOPHAGOGASTRODUODENOSCOPY N/A 09/18/2020   Procedure: ESOPHAGOGASTRODUODENOSCOPY (EGD);  Surgeon: Toledo, Benay Pike, MD;  Location: [facility] ENDOSCOPY;  Service: Gastroenterology;  Laterality: N/A;   ESOPHAGOGASTRODUODENOSCOPY (EGD) WITH PROPOFOL N/A 10/11/2019   Procedure: ESOPHAGOGASTRODUODENOSCOPY (EGD) WITH PROPOFOL;  Surgeon: Virgel Manifold, MD;  Location: [facility] ENDOSCOPY;  Service: Endoscopy;  Laterality: N/A;   ESOPHAGOGASTRODUODENOSCOPY (EGD) WITH PROPOFOL N/A 01/17/2020   Procedure: ESOPHAGOGASTRODUODENOSCOPY (EGD) WITH PROPOFOL;  Surgeon: Lin Landsman, MD;  Location: [facility] ENDOSCOPY;  Service: Gastroenterology;  Laterality: N/A;   TONSILLECTOMY     Family History  Problem Relation Age of Onset   Heart Problems Mother    Seizures Father    Social History   Tobacco Use   Smoking status: Former    Types: Cigarettes   Smokeless tobacco: Never  Substance Use Topics   Alcohol use: Not Currently   Allergies  Allergen Reactions   Aspirin Nausea Only and Other (See Comments)    GI upset   Penicillins Rash   Prior to Admission medications   Medication Sig Start Date End Date Taking? Authorizing Provider  albuterol (VENTOLIN HFA) 108 (90 Base) MCG/ACT inhaler Inhale 2 puffs into the lungs every 6 (six) hours as needed for wheezing or shortness of breath. 10/14/19  Yes Nolberto Hanlon, MD  amLODipine (NORVASC) 5 MG tablet Take 1 tablet (5 mg total) by mouth daily. 10/30/21  Yes Wellington Hampshire, MD  cyclobenzaprine (FLEXERIL) 10 MG tablet Take 10 mg by mouth as needed for muscle spasms.   Yes [provider]  empagliflozin (JARDIANCE) 10 MG TABS tablet Take 1 tablet (10 mg total) by mouth daily before breakfast. 01/05/22  Yes Hackney, Tina A, FNP   Fluticasone Propionate, Inhal, (FLOVENT IN) Inhale into the lungs as needed.   Yes [provider]  furosemide (LASIX) 40 MG tablet Take 1 tablet (40 mg total) by mouth daily. 10/30/21  Yes Wellington Hampshire, MD  glipiZIDE (GLUCOTROL XL) 10 MG 24 hr tablet Take 10 mg by mouth 2 (two) times daily. 10/27/21  Yes [provider]  pantoprazole (PROTONIX) 40 MG tablet Take 1 tablet (40 mg total) by mouth daily. 10/17/21  Yes Sreenath, Sudheer B, MD  pravastatin (PRAVACHOL) 40 MG tablet Take 1 tablet (40 mg total) by mouth daily. 01/05/22  Yes Hackney, Otila Kluver A, FNP  vitamin B-12 (CYANOCOBALAMIN) 1000 MCG tablet Take 1 tablet (1,000 mcg total) by mouth daily. 08/05/21  Yes Vanga, Tally Due, MD  witch hazel-glycerin (TUCKS) pad Apply topically as needed for itching. 10/17/21  Yes Sreenath, Trula Slade, MD  meclizine (ANTIVERT) 12.5 MG tablet Take 1 tablet (12.5 mg total) by mouth 3 (three) times daily as needed for up to 30 doses for dizziness. Patient not taking: Reported on 02/11/2022 09/20/20   Nicole Kindred A, DO  metoprolol succinate (TOPROL XL) 25 MG 24  hr tablet Take 1 tablet (25 mg total) by mouth daily. Patient not taking: Reported on 02/11/2022 10/17/21   Sidney Ace, MD    Review of Systems  Constitutional:  Positive for fatigue. Negative for appetite change.  HENT:  Negative for congestion, postnasal drip and sore throat.   Eyes: Negative.   Respiratory:  Positive for cough and shortness of breath.   Cardiovascular:  Negative for chest pain, palpitations and leg swelling.  Gastrointestinal:  Negative for abdominal distention and abdominal pain.  Endocrine: Negative.   Genitourinary: Negative.   Musculoskeletal:  Positive for back pain. Negative for neck pain.  Skin: Negative.   Allergic/Immunologic: Negative.   Neurological:  Positive for dizziness. Negative for light-headedness.  Hematological:  Negative for adenopathy. Does not bruise/bleed easily.   Psychiatric/Behavioral:  Positive for sleep disturbance (sleeping on 3 pillows with oxygen at 2.5L). Negative for dysphoric mood. The patient is not nervous/anxious.     There were no vitals filed for this visit.  Wt Readings from Last 3 Encounters:  02/11/22 209 lb 6 oz (95 kg)  01/01/22 216 lb 8 oz (98.2 kg)  11/25/21 212 lb 5 oz (96.3 kg)   Lab Results  Component Value Date   CREATININE 1.50 (H) 02/11/2022   CREATININE 0.99 11/26/2021   CREATININE 1.05 (H) 11/25/2021   Physical Exam Vitals and nursing note reviewed. Exam conducted with a chaperone present (friend Sonia Side).  Constitutional:      Appearance: Normal appearance.  HENT:     Head: Normocephalic and atraumatic.  Cardiovascular:     Rate and Rhythm: Normal rate and regular rhythm.  Pulmonary:     Effort: Pulmonary effort is normal. No respiratory distress.     Breath sounds: No wheezing or rales.  Abdominal:     General: There is no distension.     Palpations: Abdomen is soft.  Musculoskeletal:        General: No tenderness.     Cervical back: Normal range of motion and neck supple.     Right lower leg: Edema (trace pitting) present.     Left lower leg: Edema (trace pitting) present.  Skin:    General: Skin is warm and dry.  Neurological:     General: No focal deficit present.     Mental Status: She is alert and oriented to person, place, and time.  Psychiatric:        Mood and Affect: Mood normal.        Behavior: Behavior normal.        Thought Content: Thought content normal.     Assessment and Plan:  Chronic heart failure with preserved ejection fraction with structural changes (LVH)- - NYHA II today - euvolemic - weighing daily; reminded to weigh daily and report weight gain of > 3lb/night or 5 lbs/week - weight down 7 pounds from last visit here 5 weeks ago - discussed fluid restriction, < 64 ounces/day, again, as she's drinking too much fluids  - using NoSalt as her seasoning - NS with  cardiology Kathlen Mody) on 11/27/21 - on GDMT of jardiance - will check labs today since started jardiance at last visit - will resume metoprolol today since her HR is in the 90's - consider adding entresto - she will take her furosemide once she gets home today - BNP 10/13/21 was 8.4  2. HTN - - BP looks good (134/77) - needs to schedule f/u appt with [facility]  - BMP 02/11/22 reviewed and showed sodium  141, potassium 3.8, creatinine 1.50 and GFR 39  3. Tobacco abuse - - smoking 2 cigarettes/day - encouraged complete cessation  4: DM- - A1c 10/14/21 was 7.2%    Medication bottles were brought and individually reviewed. Letter provided today stating that she needs assistance with her CPAP.   Return in  months, sooner if needed.

## 2022-06-29 ENCOUNTER — Other Ambulatory Visit: Payer: Self-pay | Admitting: Family

## 2022-06-29 ENCOUNTER — Other Ambulatory Visit: Payer: Self-pay | Admitting: Cardiovascular Disease

## 2022-06-29 NOTE — Telephone Encounter (Signed)
Please schedule overdue F/U appt for refills. Patient did not show for last scheduled office visit. Thank you!

## 2022-07-01 ENCOUNTER — Ambulatory Visit: Payer: Medicaid Other | Admitting: Family

## 2022-07-07 ENCOUNTER — Ambulatory Visit: Payer: Medicaid Other | Attending: Family | Admitting: Family

## 2022-07-07 ENCOUNTER — Encounter: Payer: Self-pay | Admitting: Family

## 2022-07-07 VITALS — BP 160/87 | HR 104 | Resp 20 | Ht 63.0 in | Wt 207.1 lb

## 2022-07-07 DIAGNOSIS — R42 Dizziness and giddiness: Secondary | ICD-10-CM | POA: Diagnosis not present

## 2022-07-07 DIAGNOSIS — G479 Sleep disorder, unspecified: Secondary | ICD-10-CM | POA: Insufficient documentation

## 2022-07-07 DIAGNOSIS — I5032 Chronic diastolic (congestive) heart failure: Secondary | ICD-10-CM

## 2022-07-07 DIAGNOSIS — Z720 Tobacco use: Secondary | ICD-10-CM | POA: Diagnosis not present

## 2022-07-07 DIAGNOSIS — E669 Obesity, unspecified: Secondary | ICD-10-CM | POA: Insufficient documentation

## 2022-07-07 DIAGNOSIS — R000 Tachycardia, unspecified: Secondary | ICD-10-CM | POA: Insufficient documentation

## 2022-07-07 DIAGNOSIS — G4730 Sleep apnea, unspecified: Secondary | ICD-10-CM | POA: Diagnosis not present

## 2022-07-07 DIAGNOSIS — Z79899 Other long term (current) drug therapy: Secondary | ICD-10-CM | POA: Diagnosis not present

## 2022-07-07 DIAGNOSIS — E119 Type 2 diabetes mellitus without complications: Secondary | ICD-10-CM

## 2022-07-07 DIAGNOSIS — R0602 Shortness of breath: Secondary | ICD-10-CM | POA: Diagnosis not present

## 2022-07-07 DIAGNOSIS — I10 Essential (primary) hypertension: Secondary | ICD-10-CM | POA: Diagnosis not present

## 2022-07-07 DIAGNOSIS — J45909 Unspecified asthma, uncomplicated: Secondary | ICD-10-CM | POA: Insufficient documentation

## 2022-07-07 DIAGNOSIS — F17210 Nicotine dependence, cigarettes, uncomplicated: Secondary | ICD-10-CM | POA: Diagnosis not present

## 2022-07-07 DIAGNOSIS — I110 Hypertensive heart disease with heart failure: Secondary | ICD-10-CM | POA: Diagnosis not present

## 2022-07-07 NOTE — Patient Instructions (Addendum)
Continue weighing daily and call for an overnight weight gain of 3 pounds or more or a weekly weight gain of more than 5 pounds.   If you have voicemail, please make sure your mailbox is cleaned out so that we may leave a message and please make sure to listen to any voicemails.    Take your lasix as 1/2 tablet every day and take an extra 1/2 tablet if you need it for above weight gain or swelling   Increase your metoprolol to 2 tablets daily

## 2022-07-07 NOTE — Progress Notes (Signed)
Patient ID: Jaime Fitzgerald, female    DOB: 04/16/1961, 61 y.o.   MRN: [id number]  HPI  Kenedi Cilia is a 61 year old female with a past medical history of hypertension, acute blood loss anemia with history of GI bleed, asthma, diabetes mellitus type 2, seizures, sleep apnea, dyspnea, obesity.  Echo report from 11/26/21 reviewed and showed an EF of 60-65% along with mild LVH. Echo report from 10/14/21 reviewed and showed: EF of 45-50% with moderate LV dysfunction, GIII restrictive. Repeat echo scheduled on 11/26/21 by Dr. Fletcher Anon as his review of the echo did not match the above interpretation.   Has not been admitted or been in the ED in the last 6 months.   She presents today for a follow-up visit with a chief complaint of minimal fatigue upon moderate exertion. Describes this as chronic in nature. She has associated cough, shortness of breath, difficulty sleeping and dizziness along with this. She denies any abdominal distention, palpitations, pedal edema, chest pain or weight gain.   Just took her medications ~ 1 hour prior to clinic visit because she woke up late and didn't have time to eat.   Has been taking 1/2 tablet of lasix instead of the whole tablet and feels better since she started doing this.    Past Medical History:  Diagnosis Date   Acute blood loss anemia 10/11/2019   Acute GI bleeding 10/11/2019   Anginal pain (HCC)    Arthritis    Asthma    CHF (congestive heart failure) (HCC)    Diabetes mellitus without complication (HCC)    Dyspnea    Hypertension    Seizures ([place])    Sleep apnea    Past Surgical History:  Procedure Laterality Date   CARPAL TUNNEL RELEASE     COLONOSCOPY WITH PROPOFOL N/A 01/17/2020   Procedure: COLONOSCOPY WITH PROPOFOL;  Surgeon: Lin Landsman, MD;  Location: [facility] ENDOSCOPY;  Service: Gastroenterology;  Laterality: N/A;   ESOPHAGOGASTRODUODENOSCOPY N/A 10/13/2019   Procedure: ESOPHAGOGASTRODUODENOSCOPY (EGD);  Surgeon: Virgel Manifold,  MD;  Location: [facility] ENDOSCOPY;  Service: Endoscopy;  Laterality: N/A;   ESOPHAGOGASTRODUODENOSCOPY N/A 09/18/2020   Procedure: ESOPHAGOGASTRODUODENOSCOPY (EGD);  Surgeon: Toledo, Benay Pike, MD;  Location: [facility] ENDOSCOPY;  Service: Gastroenterology;  Laterality: N/A;   ESOPHAGOGASTRODUODENOSCOPY (EGD) WITH PROPOFOL N/A 10/11/2019   Procedure: ESOPHAGOGASTRODUODENOSCOPY (EGD) WITH PROPOFOL;  Surgeon: Virgel Manifold, MD;  Location: [facility] ENDOSCOPY;  Service: Endoscopy;  Laterality: N/A;   ESOPHAGOGASTRODUODENOSCOPY (EGD) WITH PROPOFOL N/A 01/17/2020   Procedure: ESOPHAGOGASTRODUODENOSCOPY (EGD) WITH PROPOFOL;  Surgeon: Lin Landsman, MD;  Location: [facility] ENDOSCOPY;  Service: Gastroenterology;  Laterality: N/A;   TONSILLECTOMY     Family History  Problem Relation Age of Onset   Heart Problems Mother    Seizures Father    Social History   Tobacco Use   Smoking status: Former    Types: Cigarettes   Smokeless tobacco: Never  Substance Use Topics   Alcohol use: Not Currently   Allergies  Allergen Reactions   Aspirin Nausea Only and Other (See Comments)    GI upset   Penicillins Rash   Past Medical History:  Diagnosis Date   Acute blood loss anemia 10/11/2019   Acute GI bleeding 10/11/2019   Anginal pain (HCC)    Arthritis    Asthma    CHF (congestive heart failure) (HCC)    Diabetes mellitus without complication ([place])    Dyspnea    Hypertension    Seizures ([place])  Sleep apnea    Past Surgical History:  Procedure Laterality Date   CARPAL TUNNEL RELEASE     COLONOSCOPY WITH PROPOFOL N/A 01/17/2020   Procedure: COLONOSCOPY WITH PROPOFOL;  Surgeon: Lin Landsman, MD;  Location: [facility] ENDOSCOPY;  Service: Gastroenterology;  Laterality: N/A;   ESOPHAGOGASTRODUODENOSCOPY N/A 10/13/2019   Procedure: ESOPHAGOGASTRODUODENOSCOPY (EGD);  Surgeon: Virgel Manifold, MD;  Location: [facility] ENDOSCOPY;  Service: Endoscopy;  Laterality: N/A;   ESOPHAGOGASTRODUODENOSCOPY N/A  09/18/2020   Procedure: ESOPHAGOGASTRODUODENOSCOPY (EGD);  Surgeon: Toledo, Benay Pike, MD;  Location: [facility] ENDOSCOPY;  Service: Gastroenterology;  Laterality: N/A;   ESOPHAGOGASTRODUODENOSCOPY (EGD) WITH PROPOFOL N/A 10/11/2019   Procedure: ESOPHAGOGASTRODUODENOSCOPY (EGD) WITH PROPOFOL;  Surgeon: Virgel Manifold, MD;  Location: [facility] ENDOSCOPY;  Service: Endoscopy;  Laterality: N/A;   ESOPHAGOGASTRODUODENOSCOPY (EGD) WITH PROPOFOL N/A 01/17/2020   Procedure: ESOPHAGOGASTRODUODENOSCOPY (EGD) WITH PROPOFOL;  Surgeon: Lin Landsman, MD;  Location: [facility] ENDOSCOPY;  Service: Gastroenterology;  Laterality: N/A;   TONSILLECTOMY     Family History  Problem Relation Age of Onset   Heart Problems Mother    Seizures Father    Social History   Tobacco Use   Smoking status: Former    Types: Cigarettes   Smokeless tobacco: Never  Substance Use Topics   Alcohol use: Not Currently   Allergies  Allergen Reactions   Aspirin Nausea Only and Other (See Comments)    GI upset   Penicillins Rash   Prior to Admission medications   Medication Sig Start Date End Date Taking? Authorizing Provider  albuterol (VENTOLIN HFA) 108 (90 Base) MCG/ACT inhaler Inhale 2 puffs into the lungs every 6 (six) hours as needed for wheezing or shortness of breath. 10/14/19  Yes Nolberto Hanlon, MD  amLODipine (NORVASC) 5 MG tablet TAKE ONE TABLET BY MOUTH DAILY (NEW DOSE) 06/30/22  Yes Wellington Hampshire, MD  cyclobenzaprine (FLEXERIL) 10 MG tablet Take 10 mg by mouth as needed for muscle spasms.   Yes [provider]  Fluticasone Propionate, Inhal, (FLOVENT IN) Inhale into the lungs as needed.   Yes [provider]  furosemide (LASIX) 40 MG tablet Take 1/2 tablet (20 mg total) by mouth daily. 04/13/22  Yes Shreshta Medley A, FNP  glipiZIDE (GLUCOTROL XL) 10 MG 24 hr tablet Take 10 mg by mouth 2 (two) times daily. 10/27/21  Yes [provider]  JARDIANCE 10 MG TABS tablet Take 10 mg by mouth every  morning. 06/10/22  Yes [provider]  metoprolol succinate (TOPROL-XL) 25 MG 24 hr tablet TAKE TWO TABLET BY MOUTH EVERY DAY 06/29/22  Yes Darylene Price A, FNP  pantoprazole (PROTONIX) 40 MG tablet Take 1 tablet (40 mg total) by mouth daily. 10/17/21  Yes Sidney Ace, MD  pravastatin (PRAVACHOL) 40 MG tablet TAKE ONE TABLET BY MOUTH EVERY DAY 05/22/22  Yes Alisa Graff, FNP  witch hazel-glycerin (TUCKS) pad Apply topically as needed for itching. 10/17/21  Yes Sreenath, Trula Slade, MD  meclizine (ANTIVERT) 12.5 MG tablet Take 1 tablet (12.5 mg total) by mouth 3 (three) times daily as needed for up to 30 doses for dizziness. Patient not taking: Reported on 02/11/2022 09/20/20   Nicole Kindred A, DO  vitamin B-12 (CYANOCOBALAMIN) 1000 MCG tablet Take 1 tablet (1,000 mcg total) by mouth daily. Patient not taking: Reported on 07/07/2022 08/05/21   Lin Landsman, MD     Review of Systems  Constitutional:  Positive for fatigue. Negative for appetite change.  HENT:  Negative for congestion, postnasal drip  and sore throat.   Eyes: Negative.   Respiratory:  Positive for cough and shortness of breath.   Cardiovascular:  Negative for chest pain, palpitations and leg swelling.  Gastrointestinal:  Negative for abdominal distention and abdominal pain.  Endocrine: Negative.   Genitourinary: Negative.   Musculoskeletal:  Positive for back pain. Negative for neck pain.  Skin: Negative.   Allergic/Immunologic: Negative.   Neurological:  Positive for dizziness. Negative for light-headedness.  Hematological:  Negative for adenopathy. Does not bruise/bleed easily.  Psychiatric/Behavioral:  Positive for sleep disturbance (sleeping on 3 pillows with oxygen at 2.5L). Negative for dysphoric mood. The patient is not nervous/anxious.    Vitals:   07/07/22 1304  BP: (!) 160/87  Pulse: (!) 104  Resp: 20  SpO2: 98%  Weight: 207 lb 2 oz (94 kg)  Height: '5\' 3"'$  (1.6 m)   Wt Readings from Last 3  Encounters:  07/07/22 207 lb 2 oz (94 kg)  02/11/22 209 lb 6 oz (95 kg)  01/01/22 216 lb 8 oz (98.2 kg)   Lab Results  Component Value Date   CREATININE 1.50 (H) 02/11/2022   CREATININE 0.99 11/26/2021   CREATININE 1.05 (H) 11/25/2021   Physical Exam Vitals and nursing note reviewed. Exam conducted with a chaperone present (friend Sonia Side).  Constitutional:      Appearance: Normal appearance.  HENT:     Head: Normocephalic and atraumatic.  Cardiovascular:     Rate and Rhythm: Regular rhythm. Tachycardia present.  Pulmonary:     Effort: Pulmonary effort is normal. No respiratory distress.     Breath sounds: No wheezing or rales.  Abdominal:     General: There is no distension.     Palpations: Abdomen is soft.  Musculoskeletal:        General: No tenderness.     Cervical back: Normal range of motion and neck supple.     Right lower leg: Edema (trace pitting) present.     Left lower leg: Edema (trace pitting) present.  Skin:    General: Skin is warm and dry.  Neurological:     General: No focal deficit present.     Mental Status: She is alert and oriented to person, place, and time.  Psychiatric:        Mood and Affect: Mood normal.        Behavior: Behavior normal.        Thought Content: Thought content normal.     Assessment and Plan:  Chronic heart failure with preserved ejection fraction with structural changes (LVH)- - NYHA II today - euvolemic - weighing daily; reminded to weigh daily and report weight gain of > 3lb/night or 5 lbs/week - weight down 2 pounds from last visit here 5 months ago - using NoSalt as her seasoning - NS with cardiology Kathlen Mody) on 11/27/21 - on GDMT of jardiance - consider adding entresto - advised to continue taking 1/2 tablet furosemide ('20mg'$ ) daily and take additional '20mg'$  PRN for above weight gain or swelling - increase metoprolol to '50mg'$  daily due to tachycardia; she can take 2 tablets daily and will send in new RX for '50mg'$  dose at  next visit - BNP 10/13/21 was 8.4  2. HTN - - BP elevated (160/87) but she says that she just took her meds 1 hour prior to visit - needs to schedule f/u appt with [facility]  - BMP 02/11/22 reviewed and showed sodium 141, potassium 3.8, creatinine 0.99 and GFR 64 - wanted to check  labs today but she asks to defer it until next visit in 2 weeks  3. Tobacco abuse - - smoking 1 cigarette day - encouraged complete cessation  4: DM- - A1c 10/14/21 was 7.2% - home glucose this morning was 120   Medication bottles were brought and individually reviewed.   Return in 2 weeks, sooner if needed.

## 2022-07-21 ENCOUNTER — Ambulatory Visit: Payer: Medicaid Other | Admitting: Family

## 2022-07-23 ENCOUNTER — Telehealth: Payer: Self-pay | Admitting: Medical

## 2022-07-23 ENCOUNTER — Encounter: Payer: Self-pay | Admitting: Medical

## 2022-07-23 ENCOUNTER — Other Ambulatory Visit
Admission: RE | Admit: 2022-07-23 | Discharge: 2022-07-23 | Disposition: A | Discharge status: Home / Self Care | Payer: Medicaid Other | Source: Ambulatory Visit | Attending: Medical | Admitting: Medical

## 2022-07-23 ENCOUNTER — Ambulatory Visit: Payer: Medicaid Other | Attending: Medical | Admitting: Medical

## 2022-07-23 VITALS — BP 138/85 | HR 74 | Ht 63.0 in | Wt 210.2 lb

## 2022-07-23 DIAGNOSIS — R079 Chest pain, unspecified: Secondary | ICD-10-CM

## 2022-07-23 DIAGNOSIS — I5032 Chronic diastolic (congestive) heart failure: Secondary | ICD-10-CM

## 2022-07-23 DIAGNOSIS — R9431 Abnormal electrocardiogram [ECG] [EKG]: Secondary | ICD-10-CM

## 2022-07-23 DIAGNOSIS — R0609 Other forms of dyspnea: Secondary | ICD-10-CM

## 2022-07-23 DIAGNOSIS — I3139 Other pericardial effusion (noninflammatory): Secondary | ICD-10-CM | POA: Diagnosis present

## 2022-07-23 DIAGNOSIS — I10 Essential (primary) hypertension: Secondary | ICD-10-CM | POA: Diagnosis present

## 2022-07-23 DIAGNOSIS — E785 Hyperlipidemia, unspecified: Secondary | ICD-10-CM

## 2022-07-23 LAB — BASIC METABOLIC PANEL
Anion gap: 9 (ref 5–15)
BUN: 24 mg/dL — ABNORMAL HIGH (ref 8–23)
CO2: 29 mmol/L (ref 22–32)
Calcium: 8.6 mg/dL — ABNORMAL LOW (ref 8.9–10.3)
Chloride: 101 mmol/L (ref 98–111)
Creatinine, Ser: 1.11 mg/dL — ABNORMAL HIGH (ref 0.44–1.00)
GFR, Estimated: 56 mL/min — ABNORMAL LOW (ref >60)
Glucose, Bld: 124 mg/dL — ABNORMAL HIGH (ref 70–99)
Potassium: 3.8 mmol/L (ref 3.5–5.1)
Sodium: 139 mmol/L (ref 135–145)

## 2022-07-23 LAB — CBC WITH DIFFERENTIAL/PLATELET
Abs Immature Granulocytes: 0.04 10*3/uL (ref 0.00–0.07)
Basophils Absolute: 0 10*3/uL (ref 0.0–0.1)
Basophils Relative: 0 %
Eosinophils Absolute: 0.1 10*3/uL (ref 0.0–0.5)
Eosinophils Relative: 2 %
HCT: 47.4 % — ABNORMAL HIGH (ref 36.0–46.0)
Hemoglobin: 15 g/dL (ref 12.0–15.0)
Immature Granulocytes: 1 %
Lymphocytes Relative: 37 %
Lymphs Abs: 2.6 10*3/uL (ref 0.7–4.0)
MCH: 33.6 pg (ref 26.0–34.0)
MCHC: 31.6 g/dL (ref 30.0–36.0)
MCV: 106.3 fL — ABNORMAL HIGH (ref 80.0–100.0)
Monocytes Absolute: 0.6 10*3/uL (ref 0.1–1.0)
Monocytes Relative: 8 %
Neutro Abs: 3.5 10*3/uL (ref 1.7–7.7)
Neutrophils Relative %: 52 %
Platelets: 212 10*3/uL (ref 150–400)
RBC: 4.46 MIL/uL (ref 3.87–5.11)
RDW: 14.4 % (ref 11.5–15.5)
WBC: 6.9 10*3/uL (ref 4.0–10.5)
nRBC: 0 % (ref 0.0–0.2)

## 2022-07-23 LAB — BRAIN NATRIURETIC PEPTIDE: B Natriuretic Peptide: 4.5 pg/mL (ref 0.0–100.0)

## 2022-07-23 MED ORDER — METOPROLOL TARTRATE 100 MG PO TABS: ORAL_TABLET | ORAL | 0 refills | Status: DC

## 2022-07-23 NOTE — Progress Notes (Signed)
Cardiology Office Note:    Date:  07/23/2022   ID:  Jaime Fitzgerald, DOB Jun 03, 1961, MRN [id number]  PCP:  [facility] Cardiologist:  None  [facility] Electrophysiologist:  None   Referring MD: [facility], [facility]   Chief Complaint: 6 month follow-up  History of Present Illness:    Jaime Fitzgerald is a 61 y.o. female with a hx of HFmrEF, HTN, previous GIB with blood loss anemia, Asthma/COPD, toabcco use, DM2, OSA and obesity.   She was hospitalized in January 2023 for shortness of breath. CTA showed small pericardial effusion and cardiomegaly. Echo showed LVEF 45-50%, G3DD. The patient was diuresed. She continued to be hypoxic and was sent home with 2L O2. Dr. Fletcher Anon reviewed echo and felt LVEF was normal with G1DD and mild LVH.   Seen in the office 10/2021 and lasix was increased. A limited echo was ordered. This showed LVEF 60-65%, mild LVH and small pericardial effusion. She has been following with Heart failure clinic.   Today, the patient reports occasional sharp chest pain that is worse with movement. She also reports a cold, shortness of breath and a cough. This is has been going on for about a month. She denies swelling on her feet. She has sleep apnea and uses her CPAP. EKG shows NSR with TWI V3-V6.    Past Medical History:  Diagnosis Date   Acute blood loss anemia 10/11/2019   Acute GI bleeding 10/11/2019   Anginal pain (HCC)    Arthritis    Asthma    CHF (congestive heart failure) (HCC)    Diabetes mellitus without complication (HCC)    Dyspnea    Hypertension    Seizures ([place])    Sleep apnea     Past Surgical History:  Procedure Laterality Date   CARPAL TUNNEL RELEASE     COLONOSCOPY WITH PROPOFOL N/A 01/17/2020   Procedure: COLONOSCOPY WITH PROPOFOL;  Surgeon: Lin Landsman, MD;  Location: [facility] ENDOSCOPY;  Service: Gastroenterology;  Laterality: N/A;   ESOPHAGOGASTRODUODENOSCOPY N/A 10/13/2019   Procedure:  ESOPHAGOGASTRODUODENOSCOPY (EGD);  Surgeon: Virgel Manifold, MD;  Location: [facility] ENDOSCOPY;  Service: Endoscopy;  Laterality: N/A;   ESOPHAGOGASTRODUODENOSCOPY N/A 09/18/2020   Procedure: ESOPHAGOGASTRODUODENOSCOPY (EGD);  Surgeon: Toledo, Benay Pike, MD;  Location: [facility] ENDOSCOPY;  Service: Gastroenterology;  Laterality: N/A;   ESOPHAGOGASTRODUODENOSCOPY (EGD) WITH PROPOFOL N/A 10/11/2019   Procedure: ESOPHAGOGASTRODUODENOSCOPY (EGD) WITH PROPOFOL;  Surgeon: Virgel Manifold, MD;  Location: [facility] ENDOSCOPY;  Service: Endoscopy;  Laterality: N/A;   ESOPHAGOGASTRODUODENOSCOPY (EGD) WITH PROPOFOL N/A 01/17/2020   Procedure: ESOPHAGOGASTRODUODENOSCOPY (EGD) WITH PROPOFOL;  Surgeon: Lin Landsman, MD;  Location[facility] ENDOSCOPY;  Service: Gastroenterology;  Laterality: N/A;   TONSILLECTOMY      Current Medications: Current Meds  Medication Sig   albuterol (VENTOLIN HFA) 108 (90 Base) MCG/ACT inhaler Inhale 2 puffs into the lungs every 6 (six) hours as needed for wheezing or shortness of breath.   amLODipine (NORVASC) 5 MG tablet TAKE ONE TABLET BY MOUTH DAILY (NEW DOSE)   cyclobenzaprine (FLEXERIL) 10 MG tablet Take 10 mg by mouth as needed for muscle spasms.   furosemide (LASIX) 40 MG tablet Take 1 tablet (40 mg total) by mouth daily. (Patient taking differently: Take 40 mg by mouth daily. And additional '20mg'$  PRN)   glipiZIDE (GLUCOTROL XL) 10 MG 24 hr tablet Take 10 mg by mouth 2 (two) times daily.   JARDIANCE 10 MG TABS tablet Take 10 mg by mouth  every morning.   metoprolol succinate (TOPROL-XL) 25 MG 24 hr tablet TAKE ONE TABLET BY MOUTH EVERY DAY   metoprolol tartrate (LOPRESSOR) 100 MG tablet Take 1 tablet (100 mg) by mouth 2 hours prior to your Cardiac CT   pantoprazole (PROTONIX) 40 MG tablet Take 1 tablet (40 mg total) by mouth daily.   pravastatin (PRAVACHOL) 40 MG tablet TAKE ONE TABLET BY MOUTH EVERY DAY     Allergies:   Aspirin and Penicillins   Social History    Socioeconomic History   Marital status: Legally Separated    Spouse name: Not on file   Number of children: Not on file   Years of education: Not on file   Highest education level: Not on file  Occupational History   Not on file  Tobacco Use   Smoking status: Former    Types: Cigarettes   Smokeless tobacco: Never  Substance and Sexual Activity   Alcohol use: Not Currently   Drug use: Not Currently   Sexual activity: Not on file  Other Topics Concern   Not on file  Social History Narrative   Not on file   Social Determinants of Health   Financial Resource Strain: Not on file  Food Insecurity: Not on file  Transportation Needs: Not on file  Physical Activity: Not on file  Stress: Not on file  Social Connections: Not on file     Family History: The patient's family history includes Heart Problems in her mother; Seizures in her father.  ROS:   Please see the history of present illness.     All other systems reviewed and are negative.  EKGs/Labs/Other Studies Reviewed:    The following studies were reviewed today:  Limited echo 11/2021  1. Left ventricular ejection fraction, by estimation, is 60 to 65%. Left  ventricular ejection fraction by 2D MOD biplane is 65.1 %. The left  ventricle has normal function. The left ventricle has no regional wall  motion abnormalities. There is mild left  ventricular hypertrophy.   2. Right ventricular systolic function is normal.   3. There is no evidence of cardiac tamponade.   4. The mitral valve is normal in structure. Trivial mitral valve  regurgitation.   5. Aortic valve regurgitation is not visualized.   6. The inferior vena cava is normal in size with greater than 50%  respiratory variability, suggesting right atrial pressure of 3 mmHg.   Echo 10/2021  1. Left ventricular ejection fraction, by estimation, is 45 to 50%. The  left ventricle has mildly decreased function. The left ventricle has no  regional wall motion  abnormalities. Left ventricular diastolic parameters  are consistent with Grade III  diastolic dysfunction (restrictive).   2. Right ventricular systolic function is moderately reduced. The right  ventricular size is moderately enlarged.   3. Left atrial size was mildly dilated.   4. Right atrial size was mildly dilated.   5. The mitral valve is normal in structure. Trivial mitral valve  regurgitation. No evidence of mitral stenosis.   6. The aortic valve is normal in structure. Aortic valve regurgitation is  not visualized. No aortic stenosis is present.   7. The inferior vena cava is normal in size with greater than 50%  respiratory variability, suggesting right atrial pressure of 3 mmHg.   EKG:  EKG is ordered today.  The ekg ordered today demonstrates NSR 74bpm, TWI V3-V6  Recent Labs: 10/13/2021: ALT 11 10/14/2021: TSH 1.955 10/17/2021: Magnesium 2.2 07/23/2022: B Natriuretic Peptide  4.5; BUN 24; Creatinine, Ser 1.11; Hemoglobin 15.0; Platelets 212; Potassium 3.8; Sodium 139  Recent Lipid Panel No results found for: "CHOL", "TRIG", "HDL", "CHOLHDL", "VLDL", "LDLCALC", "LDLDIRECT"   Physical Exam:    VS:  BP 138/85 (BP Location: Left Arm, Patient Position: Sitting, Cuff Size: Normal)   Pulse 74   Ht '5\' 3"'$  (1.6 m)   Wt 210 lb 3.2 oz (95.3 kg)   SpO2 91%   BMI 37.24 kg/m     Wt Readings from Last 3 Encounters:  07/23/22 210 lb 3.2 oz (95.3 kg)  07/07/22 207 lb 2 oz (94 kg)  02/11/22 209 lb 6 oz (95 kg)     GEN:  Well nourished, well developed in no acute distress HEENT: Normal NECK: No JVD; No carotid bruits LYMPHATICS: No lymphadenopathy CARDIAC: RRR, no murmurs, rubs, gallops RESPIRATORY:  Clear to auscultation without rales, wheezing or rhonchi  ABDOMEN: Soft, non-tender, non-distended MUSCULOSKELETAL:  No edema; No deformity  SKIN: Warm and dry NEUROLOGIC:  Alert and oriented x 3 PSYCHIATRIC:  Normal affect   ASSESSMENT:    1. Dyspnea on exertion   2. Chronic  diastolic heart failure ([place])   3. Essential hypertension   4. Hyperlipidemia, unspecified hyperlipidemia type   5. Nonspecific abnormal electrocardiogram (ECG) (EKG)   6. Pericardial effusion   7. Chest pain of uncertain etiology    PLAN:    In order of problems listed above:  SOB/cough/chest pain Chronic diastolic heart failure Patient reports one month of shortness of breath, cough and sharp chest pain. She does not appear volume overloaded on exam. She said she has been fighting a cold. EKG shows NSR with new TWI V3-V6. I will obtain Cardiac CTA.  I will also check BNP, BMET, and CBC. I also recommended PCP follow-up for non-cardiac issues. Continue lasix '40mg'$  daily, Jardiance '10mg'$  daily, Toprol '25mg'$  daily  Pericardial effusion Repeat limited echo showed normal LVEF with small pericardial effusion. May repeat this if breathing continues to be short.   HTN BP good, continue amlodipine and Toprol  HLD Patient needs updated labs, can order at follow-up.   Disposition: Follow up in 4-6 week(s) with MD/APP     Signed, Jaime Roston Ninfa Meeker, PA-C  07/23/2022 4:39 PM    [facility]

## 2022-07-23 NOTE — Patient Instructions (Signed)
Medication Instructions:  - Your physician recommends that you continue on your current medications as directed. Please refer to the Current Medication list given to you today.  *If you need a refill on your cardiac medications before your next appointment, please call your pharmacy*   Lab Work: - Your physician recommends that you have lab work today:  BMP/ CBC/ [place] at [facility] 1st desk on the right to check in (REGISTRATION)  Lab hours: Monday- Friday (7:30 am- 5:30 pm)   If you have labs (blood work) drawn today and your tests are completely normal, you will receive your results only by: MyChart Message (if you have MyChart) OR A paper copy in the mail If you have any lab test that is abnormal or we need to change your treatment, we will call you to review the results.   Testing/Procedures:  1) Cardiac CT angiogram:  Your physician has requested that you have cardiac CT. Cardiac computed tomography (CT) is a painless test that uses an x-ray machine to take clear, detailed pictures of your heart.    Your cardiac CT will be scheduled at one of the below locations:    [facility] [address] [place], [place] [address] (336) [facility] [place], [place] [address] [phone]    If scheduled at [facility] or [facility], please arrive 15 mins early for check-in and test prep.   Please follow these instructions carefully (unless otherwise directed):  We will administer nitroglycerin during this exam.   On the Night Before the Test: Be sure to Drink plenty of water. Do not consume any caffeinated/decaffeinated beverages or chocolate 12 hours prior to your test. Do not take any antihistamines 12 hours prior to your test.  On the Day of the Test: Drink plenty of water until 1 hour prior to the  test. Do not eat any food 1 hour prior to test. You may take your regular medications prior to the test.  Take metoprolol (Lopressor) 100 mg two hours prior to test (in addition to your regular daily metoprolol succinate that you take). HOLD Furosemide morning of the test. FEMALES- please wear underwire-free bra if available, avoid dresses & tight clothing       After the Test: Drink plenty of water. After receiving IV contrast, you may experience a mild flushed feeling. This is normal. On occasion, you may experience a mild rash up to 24 hours after the test. This is not dangerous. If this occurs, you can take Benadryl 25 mg and increase your fluid intake. If you experience trouble breathing, this can be serious. If it is severe call 911 IMMEDIATELY. If it is mild, please call our office. If you take any of these medications: Glipizide/Metformin, Avandament, Glucavance, please do not take 48 hours after completing test unless otherwise instructed.  We will call to schedule your test 2-4 weeks out understanding that some insurance companies will need an authorization prior to the service being performed.   For non-scheduling related questions, please contact the cardiac imaging nurse navigator should you have any questions/concerns: Marchia Bond, Cardiac Imaging Nurse Navigator Gordy Clement, Cardiac Imaging Nurse Navigator [facility] Direct Office Dial: [phone]   For scheduling needs, including cancellations and rescheduling, please call [place], [phone].    Follow-Up: At [facility], you and your health needs are our priority.  As part of our continuing mission to provide you with exceptional heart care, we have created designated Provider Care Teams.  These Care Teams include your primary Cardiologist (physician) and Advanced Practice Providers (APPs -  Physician Assistants and Nurse Practitioners) who all work together to provide you  with the care you need, when you need it.  We recommend signing up for the patient portal called "MyChart".  Sign up information is provided on this After Visit Summary.  MyChart is used to connect with patients for Virtual Visits (Telemedicine).  Patients are able to view lab/test results, encounter notes, upcoming appointments, etc.  Non-urgent messages can be sent to your provider as well.   To learn more about what you can do with MyChart, go to [email or web address].    Your next appointment:   4-6 week(s)  The format for your next appointment:   In Person  Provider:   You may see Kathlyn Sacramento, MD or one of the following Advanced Practice Providers on your designated Care Team:   Murray Hodgkins, NP Christell Faith, PA-C Cadence Kathlen Mody, PA-C Gerrie Nordmann, NP    Other Instructions  Cardiac CT Angiogram A cardiac CT angiogram is a procedure to look at the heart and the area around the heart. It may be done to help find the cause of chest pains or other symptoms of heart disease. During this procedure, a substance called contrast dye is injected into the blood vessels in the area to be checked. A large X-ray machine, called a CT scanner, then takes detailed pictures of the heart and the surrounding area. The procedure is also sometimes called a coronary CT angiogram, coronary artery scanning, or CTA. A cardiac CT angiogram allows the health care provider to see how well blood is flowing to and from the heart. The health care provider will be able to see if there are any problems, such as: Blockage or narrowing of the coronary arteries in the heart. Fluid around the heart. Signs of weakness or disease in the muscles, valves, and tissues of the heart. Tell a health care provider about: Any allergies you have. This is especially important if you have had a previous allergic reaction to contrast dye. All medicines you are taking, including vitamins, herbs, eye drops, creams, and  over-the-counter medicines. Any blood disorders you have. Any surgeries you have had. Any medical conditions you have. Whether you are pregnant or may be pregnant. Any anxiety disorders, chronic pain, or other conditions you have that may increase your stress or prevent you from lying still. What are the risks? Generally, this is a safe procedure. However, problems may occur, including: Bleeding. Infection. Allergic reactions to medicines or dyes. Damage to other structures or organs. Kidney damage from the contrast dye that is used. Increased risk of cancer from radiation exposure. This risk is low. Talk with your health care provider about: The risks and benefits of testing. How you can receive the lowest dose of radiation. What happens before the procedure? Wear comfortable clothing and remove any jewelry, glasses, dentures, and hearing aids. Follow instructions from your health care provider about eating and drinking. This may include: For 12 hours before the procedure -- avoid caffeine. This includes tea, coffee, soda, energy drinks, and diet pills. Drink plenty of water or other fluids that do not have caffeine in them. Being well hydrated can prevent complications. For 4-6 hours before the procedure -- stop eating and drinking. The contrast dye can cause nausea, but this is less likely  if your stomach is empty. Ask your health care provider about changing or stopping your regular medicines. This is especially important if you are taking diabetes medicines, blood thinners, or medicines to treat problems with erections (erectile dysfunction). What happens during the procedure?  Hair on your chest may need to be removed so that small sticky patches called electrodes can be placed on your chest. These will transmit information that helps to monitor your heart during the procedure. An IV will be inserted into one of your veins. You might be given a medicine to control your heart rate  during the procedure. This will help to ensure that good images are obtained. You will be asked to lie on an exam table. This table will slide in and out of the CT machine during the procedure. Contrast dye will be injected into the IV. You might feel warm, or you may get a metallic taste in your mouth. You will be given a medicine called nitroglycerin. This will relax or dilate the arteries in your heart. The table that you are lying on will move into the CT machine tunnel for the scan. The person running the machine will give you instructions while the scans are being done. You may be asked to: Keep your arms above your head. Hold your breath. Stay very still, even if the table is moving. When the scanning is complete, you will be moved out of the machine. The IV will be removed. The procedure may vary among health care providers and hospitals. What can I expect after the procedure? After your procedure, it is common to have: A metallic taste in your mouth from the contrast dye. A feeling of warmth. A headache from the nitroglycerin. Follow these instructions at home: Take over-the-counter and prescription medicines only as told by your health care provider. If you are told, drink enough fluid to keep your urine pale yellow. This will help to flush the contrast dye out of your body. Most people can return to their normal activities right after the procedure. Ask your health care provider what activities are safe for you. It is up to you to get the results of your procedure. Ask your health care provider, or the department that is doing the procedure, when your results will be ready. Keep all follow-up visits as told by your health care provider. This is important. Contact a health care provider if: You have any symptoms of allergy to the contrast dye. These include: Shortness of breath. Rash or hives. A racing heartbeat. Summary A cardiac CT angiogram is a procedure to look at the  heart and the area around the heart. It may be done to help find the cause of chest pains or other symptoms of heart disease. During this procedure, a large X-ray machine, called a CT scanner, takes detailed pictures of the heart and the surrounding area after a contrast dye has been injected into blood vessels in the area. Ask your health care provider about changing or stopping your regular medicines before the procedure. This is especially important if you are taking diabetes medicines, blood thinners, or medicines to treat erectile dysfunction. If you are told, drink enough fluid to keep your urine pale yellow. This will help to flush the contrast dye out of your body. This information is not intended to replace advice given to you by your health care provider. Make sure you discuss any questions you have with your health care provider. Document Revised: 01/08/2022 Document Reviewed: 05/17/2019 Elsevier Patient  Education  [place]

## 2022-07-23 NOTE — Telephone Encounter (Signed)
Cadence Ninfa Meeker, PA-C  07/23/2022  4:34 PM EDT     Labs overall look good.

## 2022-07-23 NOTE — Telephone Encounter (Signed)
Attempted to contact the patient. No answer- No voice mail.    We will attempt to reach the patient at a later time.

## 2022-07-24 NOTE — Telephone Encounter (Signed)
Attempted to call the patient. N/a & voice mail box is full.   Will attempt to reach the patient at at later time.

## 2022-07-29 ENCOUNTER — Encounter: Payer: Self-pay | Admitting: *Deleted

## 2022-07-29 ENCOUNTER — Other Ambulatory Visit: Payer: Self-pay | Admitting: Cardiovascular Disease

## 2022-07-29 ENCOUNTER — Other Ambulatory Visit: Payer: Self-pay | Admitting: Family

## 2022-07-29 NOTE — Telephone Encounter (Signed)
3rd attempt to call the patient. N/a & voice mail box is full.  Letter of results mailed to the patient.

## 2022-08-09 NOTE — Progress Notes (Deleted)
Patient ID: Jaime Fitzgerald, female    DOB: 08/28/1961, 61 y.o.   MRN: [id number]  HPI  Jaime Fitzgerald is a 61 year old female with a past medical history of hypertension, acute blood loss anemia with history of GI bleed, asthma, diabetes mellitus type 2, seizures, sleep apnea, dyspnea, obesity.  Echo report from 11/26/21 reviewed and showed an EF of 60-65% along with mild LVH. Echo report from 10/14/21 reviewed and showed: EF of 45-50% with moderate LV dysfunction, GIII restrictive. Repeat echo scheduled on 11/26/21 by Dr. Fletcher Anon as his review of the echo did not match the above interpretation.   Has not been admitted or been in the ED in the last 6 months.   She presents today for a follow-up visit with a chief complaint of    Past Medical History:  Diagnosis Date   Acute blood loss anemia 10/11/2019   Acute GI bleeding 10/11/2019   Anginal pain (HCC)    Arthritis    Asthma    CHF (congestive heart failure) ([place])    Diabetes mellitus without complication ([place])    Dyspnea    Hypertension    Seizures ([place])    Sleep apnea    Past Surgical History:  Procedure Laterality Date   CARPAL TUNNEL RELEASE     COLONOSCOPY WITH PROPOFOL N/A 01/17/2020   Procedure: COLONOSCOPY WITH PROPOFOL;  Surgeon: Lin Landsman, MD;  Location: [facility];  Service: Gastroenterology;  Laterality: N/A;   ESOPHAGOGASTRODUODENOSCOPY N/A 10/13/2019   Procedure: ESOPHAGOGASTRODUODENOSCOPY (EGD);  Surgeon: Virgel Manifold, MD;  Location: [facility] ENDOSCOPY;  Service: Endoscopy;  Laterality: N/A;   ESOPHAGOGASTRODUODENOSCOPY N/A 09/18/2020   Procedure: ESOPHAGOGASTRODUODENOSCOPY (EGD);  Surgeon: Toledo, Benay Pike, MD;  Location: [facility] ENDOSCOPY;  Service: Gastroenterology;  Laterality: N/A;   ESOPHAGOGASTRODUODENOSCOPY (EGD) WITH PROPOFOL N/A 10/11/2019   Procedure: ESOPHAGOGASTRODUODENOSCOPY (EGD) WITH PROPOFOL;  Surgeon: Virgel Manifold, MD;  Location: [facility] ENDOSCOPY;  Service: Endoscopy;  Laterality: N/A;    ESOPHAGOGASTRODUODENOSCOPY (EGD) WITH PROPOFOL N/A 01/17/2020   Procedure: ESOPHAGOGASTRODUODENOSCOPY (EGD) WITH PROPOFOL;  Surgeon: Lin Landsman, MD;  Location: [facility] ENDOSCOPY;  Service: Gastroenterology;  Laterality: N/A;   TONSILLECTOMY     Family History  Problem Relation Age of Onset   Heart Problems Mother    Seizures Father    Social History   Tobacco Use   Smoking status: Former    Types: Cigarettes   Smokeless tobacco: Never  Substance Use Topics   Alcohol use: Not Currently   Allergies  Allergen Reactions   Aspirin Nausea Only and Other (See Comments)    GI upset   Penicillins Rash   Past Medical History:  Diagnosis Date   Acute blood loss anemia 10/11/2019   Acute GI bleeding 10/11/2019   Anginal pain (HCC)    Arthritis    Asthma    CHF (congestive heart failure) (HCC)    Diabetes mellitus without complication (HCC)    Dyspnea    Hypertension    Seizures ([place])    Sleep apnea    Past Surgical History:  Procedure Laterality Date   CARPAL TUNNEL RELEASE     COLONOSCOPY WITH PROPOFOL N/A 01/17/2020   Procedure: COLONOSCOPY WITH PROPOFOL;  Surgeon: Lin Landsman, MD;  Location: [facility] ENDOSCOPY;  Service: Gastroenterology;  Laterality: N/A;   ESOPHAGOGASTRODUODENOSCOPY N/A 10/13/2019   Procedure: ESOPHAGOGASTRODUODENOSCOPY (EGD);  Surgeon: Virgel Manifold, MD;  Location: [facility] ENDOSCOPY;  Service: Endoscopy;  Laterality: N/A;   ESOPHAGOGASTRODUODENOSCOPY N/A 09/18/2020   Procedure: ESOPHAGOGASTRODUODENOSCOPY (EGD);  Surgeon: Toledo, Benay Pike, MD;  Location: [facility] ENDOSCOPY;  Service: Gastroenterology;  Laterality: N/A;   ESOPHAGOGASTRODUODENOSCOPY (EGD) WITH PROPOFOL N/A 10/11/2019   Procedure: ESOPHAGOGASTRODUODENOSCOPY (EGD) WITH PROPOFOL;  Surgeon: Virgel Manifold, MD;  Location: [facility] ENDOSCOPY;  Service: Endoscopy;  Laterality: N/A;   ESOPHAGOGASTRODUODENOSCOPY (EGD) WITH PROPOFOL N/A 01/17/2020   Procedure: ESOPHAGOGASTRODUODENOSCOPY (EGD)  WITH PROPOFOL;  Surgeon: Lin Landsman, MD;  Location: [facility] ENDOSCOPY;  Service: Gastroenterology;  Laterality: N/A;   TONSILLECTOMY     Family History  Problem Relation Age of Onset   Heart Problems Mother    Seizures Father    Social History   Tobacco Use   Smoking status: Former    Types: Cigarettes   Smokeless tobacco: Never  Substance Use Topics   Alcohol use: Not Currently   Allergies  Allergen Reactions   Aspirin Nausea Only and Other (See Comments)    GI upset   Penicillins Rash      Review of Systems  Constitutional:  Positive for fatigue. Negative for appetite change.  HENT:  Negative for congestion, postnasal drip and sore throat.   Eyes: Negative.   Respiratory:  Positive for cough and shortness of breath.   Cardiovascular:  Negative for chest pain, palpitations and leg swelling.  Gastrointestinal:  Negative for abdominal distention and abdominal pain.  Endocrine: Negative.   Genitourinary: Negative.   Musculoskeletal:  Positive for back pain. Negative for neck pain.  Skin: Negative.   Allergic/Immunologic: Negative.   Neurological:  Positive for dizziness. Negative for light-headedness.  Hematological:  Negative for adenopathy. Does not bruise/bleed easily.  Psychiatric/Behavioral:  Positive for sleep disturbance (sleeping on 3 pillows with oxygen at 2.5L). Negative for dysphoric mood. The patient is not nervous/anxious.      Physical Exam Vitals and nursing note reviewed. Exam conducted with a chaperone present (friend Sonia Side).  Constitutional:      Appearance: Normal appearance.  HENT:     Head: Normocephalic and atraumatic.  Cardiovascular:     Rate and Rhythm: Regular rhythm. Tachycardia present.  Pulmonary:     Effort: Pulmonary effort is normal. No respiratory distress.     Breath sounds: No wheezing or rales.  Abdominal:     General: There is no distension.     Palpations: Abdomen is soft.  Musculoskeletal:        General: No  tenderness.     Cervical back: Normal range of motion and neck supple.     Right lower leg: Edema (trace pitting) present.     Left lower leg: Edema (trace pitting) present.  Skin:    General: Skin is warm and dry.  Neurological:     General: No focal deficit present.     Mental Status: She is alert and oriented to person, place, and time.  Psychiatric:        Mood and Affect: Mood normal.        Behavior: Behavior normal.        Thought Content: Thought content normal.     Assessment and Plan:  Chronic heart failure with preserved ejection fraction with structural changes (LVH)- - NYHA II today - euvolemic - weighing daily; reminded to weigh daily and report weight gain of > 3lb/night or 5 lbs/week - weight 207.2 pounds from last visit here 1 month ago - using NoSalt as her seasoning - saw cardiology Kathlen Mody) on 07/23/22 - on GDMT of jardiance - consider adding entresto - advised to continue taking 1/2 tablet furosemide ('20mg'$ ) daily and  take additional '20mg'$  PRN for above weight gain or swelling - increase metoprolol to '50mg'$  daily due to tachycardia; she can take 2 tablets daily and will send in new RX for '50mg'$  dose at next visit - BNP 10/13/21 was 8.4  2. HTN - - BP  - needs to schedule f/u appt with [facility]  - BMP 07/23/22 reviewed and showed sodium 139, potassium 3.8, creatinine 1.11 and GFR 56  3. Tobacco abuse - - smoking 1 cigarette day - encouraged complete cessation  4: DM- - A1c 10/14/21 was 7.2% - home glucose this morning was    Medication bottles were brought and individually reviewed.

## 2022-08-10 ENCOUNTER — Ambulatory Visit: Payer: Medicaid Other | Admitting: Family

## 2022-08-14 ENCOUNTER — Telehealth: Payer: Self-pay | Admitting: Family

## 2022-08-14 ENCOUNTER — Ambulatory Visit: Payer: Medicaid Other | Admitting: Family

## 2022-08-14 NOTE — Telephone Encounter (Signed)
Patient did not show for her [facility] appointment on 08/14/22. Will attempt to reschedule.

## 2022-08-20 ENCOUNTER — Encounter (HOSPITAL_COMMUNITY): Payer: Self-pay

## 2022-08-28 ENCOUNTER — Other Ambulatory Visit: Payer: Self-pay | Admitting: Cardiovascular Disease

## 2022-08-31 ENCOUNTER — Ambulatory Visit: Payer: Medicaid Other | Attending: Medical | Admitting: Medical

## 2022-08-31 ENCOUNTER — Other Ambulatory Visit
Admission: RE | Admit: 2022-08-31 | Discharge: 2022-08-31 | Disposition: A | Discharge status: Home / Self Care | Payer: Medicaid Other | Attending: Medical | Admitting: Medical

## 2022-08-31 ENCOUNTER — Encounter: Payer: Self-pay | Admitting: Medical

## 2022-08-31 VITALS — BP 148/81 | HR 85 | Ht 63.0 in | Wt 213.4 lb

## 2022-08-31 DIAGNOSIS — R1013 Epigastric pain: Secondary | ICD-10-CM | POA: Insufficient documentation

## 2022-08-31 DIAGNOSIS — I3139 Other pericardial effusion (noninflammatory): Secondary | ICD-10-CM | POA: Diagnosis present

## 2022-08-31 DIAGNOSIS — R42 Dizziness and giddiness: Secondary | ICD-10-CM | POA: Insufficient documentation

## 2022-08-31 DIAGNOSIS — I10 Essential (primary) hypertension: Secondary | ICD-10-CM | POA: Diagnosis present

## 2022-08-31 DIAGNOSIS — E785 Hyperlipidemia, unspecified: Secondary | ICD-10-CM | POA: Insufficient documentation

## 2022-08-31 DIAGNOSIS — I5032 Chronic diastolic (congestive) heart failure: Secondary | ICD-10-CM | POA: Diagnosis present

## 2022-08-31 LAB — LIPID PANEL
Cholesterol: 160 mg/dL (ref 0–200)
HDL: 59 mg/dL (ref >40)
LDL Cholesterol: 60 mg/dL (ref 0–99)
Total CHOL/HDL Ratio: 2.7 RATIO
Triglycerides: 205 mg/dL — ABNORMAL HIGH (ref <150)
VLDL: 41 mg/dL — ABNORMAL HIGH (ref 0–40)

## 2022-08-31 LAB — BASIC METABOLIC PANEL
Anion gap: 9 (ref 5–15)
BUN: 21 mg/dL (ref 8–23)
CO2: 31 mmol/L (ref 22–32)
Calcium: 9.4 mg/dL (ref 8.9–10.3)
Chloride: 102 mmol/L (ref 98–111)
Creatinine, Ser: 1.17 mg/dL — ABNORMAL HIGH (ref 0.44–1.00)
GFR, Estimated: 52 mL/min — ABNORMAL LOW (ref >60)
Glucose, Bld: 127 mg/dL — ABNORMAL HIGH (ref 70–99)
Potassium: 4.1 mmol/L (ref 3.5–5.1)
Sodium: 142 mmol/L (ref 135–145)

## 2022-08-31 MED ORDER — MECLIZINE HCL 12.5 MG PO TABS: 12.5000 mg | ORAL_TABLET | Freq: Three times a day (TID) | ORAL | 0 refills | Status: DC | PRN

## 2022-08-31 MED ORDER — PANTOPRAZOLE SODIUM 40 MG PO TBEC: 40.0000 mg | DELAYED_RELEASE_TABLET | Freq: Every day | ORAL | 2 refills | Status: AC

## 2022-08-31 MED ORDER — METOPROLOL SUCCINATE ER 25 MG PO TB24: 25.0000 mg | ORAL_TABLET | Freq: Every day | ORAL | 2 refills | Status: DC

## 2022-08-31 MED ORDER — FUROSEMIDE 40 MG PO TABS: 40.0000 mg | ORAL_TABLET | Freq: Every day | ORAL | 2 refills | Status: DC

## 2022-08-31 NOTE — Progress Notes (Signed)
Cardiology Office Note:    Date:  08/31/2022   ID:  Jaime Fitzgerald, DOB [birth year], MRN [id number]  PCP:  [facility], [place] Cardiologist:  Kathlyn Sacramento, MD  [place] Electrophysiologist:  None   Referring MD: [facility], [place]   Chief Complaint: 4-6 week follow-up  History of Present Illness:    Jaime Fitzgerald is a 61 y.o. female with a hx of HFmrEF, HTN, previous GIB with blood loss anemia, asthma/COPD, tobacco use, DM2, OSA and obesity who presents for 4-6 week follow-up.  She was hospitalized in January 2023 for shortness of breath. CTA showed small pericardial effusion and cardiomegaly. Echo showed LVEF 45-50%, G3DD. The patient was diuresed. She continued to be hypoxic and was sent home with 2L O2. Dr. Fletcher Anon reviewed echo and felt LVEF was normal with G1DD and mild LVH.    Seen in the office 10/2021 and lasix was increased. A limited echo was ordered. This showed LVEF 60-65%, mild LVH and small pericardial effusion. She also follows with the Heart failure clinic.   The patient was last seen 07/23/22 reporting sharp occasional chest pain, SOB and cough. She did report URI. A cardiac CTA was ordered.   Today, the patient reports dizziness for the last week. It's worse with position changes. She did run out of Meclizine. She is sleeping with a CPAP. She has a cold with coughing, congestion, and chills, but no fevers. No overt chest pain, she reports back and shoulder pain. She has an apt with PCP on November 19th. She is requesting refills of lasix, Toprol, protonix and Meclizine. She did not get Cardiac CTA scheduled.  Past Medical History:  Diagnosis Date   Acute blood loss anemia 10/11/2019   Acute GI bleeding 10/11/2019   Anginal pain (HCC)    Arthritis    Asthma    CHF (congestive heart failure) (HCC)    Diabetes mellitus without complication (HCC)    Dyspnea    Hypertension    Seizures ([place])    Sleep apnea     Past  Surgical History:  Procedure Laterality Date   CARPAL TUNNEL RELEASE     COLONOSCOPY WITH PROPOFOL N/A 01/17/2020   Procedure: COLONOSCOPY WITH PROPOFOL;  Surgeon: Lin Landsman, MD;  Location: [facility] ENDOSCOPY;  Service: Gastroenterology;  Laterality: N/A;   ESOPHAGOGASTRODUODENOSCOPY N/A 10/13/2019   Procedure: ESOPHAGOGASTRODUODENOSCOPY (EGD);  Surgeon: Virgel Manifold, MD;  Location: [facility] ENDOSCOPY;  Service: Endoscopy;  Laterality: N/A;   ESOPHAGOGASTRODUODENOSCOPY N/A 09/18/2020   Procedure: ESOPHAGOGASTRODUODENOSCOPY (EGD);  Surgeon: Toledo, Benay Pike, MD;  Location: [facility] ENDOSCOPY;  Service: Gastroenterology;  Laterality: N/A;   ESOPHAGOGASTRODUODENOSCOPY (EGD) WITH PROPOFOL N/A 10/11/2019   Procedure: ESOPHAGOGASTRODUODENOSCOPY (EGD) WITH PROPOFOL;  Surgeon: Virgel Manifold, MD;  Location: [facility] ENDOSCOPY;  Service: Endoscopy;  Laterality: N/A;   ESOPHAGOGASTRODUODENOSCOPY (EGD) WITH PROPOFOL N/A 01/17/2020   Procedure: ESOPHAGOGASTRODUODENOSCOPY (EGD) WITH PROPOFOL;  Surgeon: Lin Landsman, MD;  Location: [facility] ENDOSCOPY;  Service: Gastroenterology;  Laterality: N/A;   TONSILLECTOMY      Current Medications: Current Meds  Medication Sig   albuterol (VENTOLIN HFA) 108 (90 Base) MCG/ACT inhaler Inhale 2 puffs into the lungs every 6 (six) hours as needed for wheezing or shortness of breath.   amLODipine (NORVASC) 5 MG tablet TAKE ONE TABLET BY MOUTH DAILY   Fluticasone Propionate, Inhal, (FLOVENT IN) Inhale into the lungs as needed.   glipiZIDE (GLUCOTROL XL) 10 MG 24 hr tablet Take 10 mg by mouth  2 (two) times daily.   JARDIANCE 10 MG TABS tablet TAKE ONE TABLET BY MOUTH EVERY DAY BEFORE BREAKFAST   pravastatin (PRAVACHOL) 40 MG tablet TAKE ONE TABLET BY MOUTH EVERY DAY   witch hazel-glycerin (TUCKS) pad Apply topically as needed for itching.   [DISCONTINUED] furosemide (LASIX) 40 MG tablet Take 1 tablet (40 mg total) by mouth daily. (Patient taking differently: Take 40  mg by mouth daily. And additional '20mg'$  PRN)   [DISCONTINUED] metoprolol succinate (TOPROL-XL) 25 MG 24 hr tablet TAKE ONE TABLET BY MOUTH EVERY DAY   [DISCONTINUED] pantoprazole (PROTONIX) 40 MG tablet Take 1 tablet (40 mg total) by mouth daily.     Allergies:   Aspirin and Penicillins   Social History   Socioeconomic History   Marital status: Legally Separated    Spouse name: Not on file   Number of children: Not on file   Years of education: Not on file   Highest education level: Not on file  Occupational History   Not on file  Tobacco Use   Smoking status: Former    Types: Cigarettes   Smokeless tobacco: Never  Substance and Sexual Activity   Alcohol use: Not Currently   Drug use: Not Currently   Sexual activity: Not on file  Other Topics Concern   Not on file  Social History Narrative   Not on file   Social Determinants of Health   Financial Resource Strain: Not on file  Food Insecurity: Not on file  Transportation Needs: Not on file  Physical Activity: Not on file  Stress: Not on file  Social Connections: Not on file     Family History: The patient's family history includes Heart Problems in her mother; Seizures in her father.  ROS:   Please see the history of present illness.     All other systems reviewed and are negative.  EKGs/Labs/Other Studies Reviewed:    The following studies were reviewed today:   Limited echo 11/2021  1. Left ventricular ejection fraction, by estimation, is 60 to 65%. Left  ventricular ejection fraction by 2D MOD biplane is 65.1 %. The left  ventricle has normal function. The left ventricle has no regional wall  motion abnormalities. There is mild left  ventricular hypertrophy.   2. Right ventricular systolic function is normal.   3. There is no evidence of cardiac tamponade.   4. The mitral valve is normal in structure. Trivial mitral valve  regurgitation.   5. Aortic valve regurgitation is not visualized.   6. The  inferior vena cava is normal in size with greater than 50%  respiratory variability, suggesting right atrial pressure of 3 mmHg.    Echo 10/2021  1. Left ventricular ejection fraction, by estimation, is 45 to 50%. The  left ventricle has mildly decreased function. The left ventricle has no  regional wall motion abnormalities. Left ventricular diastolic parameters  are consistent with Grade III  diastolic dysfunction (restrictive).   2. Right ventricular systolic function is moderately reduced. The right  ventricular size is moderately enlarged.   3. Left atrial size was mildly dilated.   4. Right atrial size was mildly dilated.   5. The mitral valve is normal in structure. Trivial mitral valve  regurgitation. No evidence of mitral stenosis.   6. The aortic valve is normal in structure. Aortic valve regurgitation is  not visualized. No aortic stenosis is present.   7. The inferior vena cava is normal in size with greater than 50%  respiratory  variability, suggesting right atrial pressure of 3 mmHg.     EKG:  EKG is not ordered today.    Recent Labs: 10/13/2021: ALT 11 10/14/2021: TSH 1.955 10/17/2021: Magnesium 2.2 07/23/2022: B Natriuretic Peptide 4.5; Hemoglobin 15.0; Platelets 212 08/31/2022: BUN 21; Creatinine, Ser 1.17; Potassium 4.1; Sodium 142  Recent Lipid Panel    Component Value Date/Time   CHOL 160 08/31/2022 1516   TRIG 205 (H) 08/31/2022 1516   HDL 59 08/31/2022 1516   CHOLHDL 2.7 08/31/2022 1516   VLDL 41 (H) 08/31/2022 1516   LDLCALC 60 08/31/2022 1516      Physical Exam:    VS:  BP (!) 148/81 (BP Location: Left Arm, Patient Position: Sitting, Cuff Size: Normal)   Pulse 85   Ht '5\' 3"'$  (1.6 m)   Wt 213 lb 6.4 oz (96.8 kg)   SpO2 90%   BMI 37.80 kg/m     Wt Readings from Last 3 Encounters:  08/31/22 213 lb 6.4 oz (96.8 kg)  07/23/22 210 lb 3.2 oz (95.3 kg)  07/07/22 207 lb 2 oz (94 kg)     GEN:  Well nourished, well developed in no acute  distress HEENT: Normal NECK: No JVD; No carotid bruits LYMPHATICS: No lymphadenopathy CARDIAC: RRR, no murmurs, rubs, gallops RESPIRATORY:  Clear to auscultation without rales, wheezing or rhonchi  ABDOMEN: Soft, non-tender, non-distended MUSCULOSKELETAL:  No edema; No deformity  SKIN: Warm and dry NEUROLOGIC:  Alert and oriented x 3 PSYCHIATRIC:  Normal affect   ASSESSMENT:    1. Chronic diastolic heart failure ([place])   2. Essential hypertension   3. Pericardial effusion   4. Dizziness   5. Hyperlipidemia, unspecified hyperlipidemia type   6. Dyspepsia    PLAN:    In order of problems listed above:  SOB/cough Chronic diastolic heart failure Patient appears to have persistent URI symptoms. At the last visit Cardiac CTA was ordered for chest pain, however they were unable to contact the patient and this was not performed. Patient is willing to re-schedule this. lab work from the last appointment showed normal BNP. She did just run out of lasix, so we will refill this. Overall, she appears euvolemic on exam. Continue lasix '40mg'$  daily, Jardiance '10mg'$  daily, Toprol '25mg'$  daily.   Dizziness Orthostatics negative today. She has been out of Meclizine, which I suspect is the reason for her dizziness. I will send in a 30 day refill. She has an appointment with PCP later this month.   Pericardial effusion Repeat limited echo 11/2021 showed normal LVEF and trace pericardial effusion. Can repeat this at follow-up if breathing is still short, suspect it's mostly from her URI.   HTN BP is higher today, however she ran out of Toprol. Refill Toprol and continue amlodipine.   HLD I will re-check fasting lipid panel today. Continue Crestor and Zetia.   Disposition: Follow up in 3 month(s) with MD/APP     Signed, Makenzy Krist Ninfa Meeker, PA-C  08/31/2022 4:33 PM    [facility]

## 2022-08-31 NOTE — Patient Instructions (Addendum)
Medication Instructions:  Your physician recommends that you continue on your current medications as directed. Please refer to the Current Medication list given to you today.  *If you need a refill on your cardiac medications before your next appointment, please call your pharmacy*  Lab Work: BMP, lipid, direct LDL to be drawn at the [facility] today  - Please go to the [facility]. You will check in at the front desk to the right as you walk into the atrium. Valet Parking is offered if needed. - No appointment needed. You may go any day between 7 am and 6 pm.  If you have labs (blood work) drawn today and your tests are completely normal, you will receive your results only by: [place] (if you have MyChart) OR A paper copy in the mail If you have any lab test that is abnormal or we need to change your treatment, we will call you to review the results.  Testing/Procedures: To schedule your cardiac CT, please call [place] at [phone].   Follow-Up: At [facility], you and your health needs are our priority.  As part of our continuing mission to provide you with exceptional heart care, we have created designated Provider Care Teams.  These Care Teams include your primary Cardiologist (physician) and Advanced Practice Providers (APPs -  Physician Assistants and Nurse Practitioners) who all work together to provide you with the care you need, when you need it.  We recommend signing up for the patient portal called "MyChart".  Sign up information is provided on this After Visit Summary.  MyChart is used to connect with patients for Virtual Visits (Telemedicine).  Patients are able to view lab/test results, encounter notes, upcoming appointments, etc.  Non-urgent messages can be sent to your provider as well.   To learn more about what you can do with MyChart, go to [email or web address].    Your next appointment:   3 month(s)  The format for your next appointment:    In Person  Provider:   You may see Kathlyn Sacramento, MD or one of the following Advanced Practice Providers on your designated Care Team:   Murray Hodgkins, NP Christell Faith, PA-C Cadence Kathlen Mody, PA-C Gerrie Nordmann, NP   Important Information About [place]

## 2022-09-01 LAB — LDL CHOLESTEROL, DIRECT: Direct LDL: 82 mg/dL (ref 0–99)

## 2022-09-22 ENCOUNTER — Other Ambulatory Visit: Payer: Self-pay | Admitting: Medical

## 2022-09-26 ENCOUNTER — Encounter (HOSPITAL_COMMUNITY): Payer: Self-pay

## 2022-11-07 ENCOUNTER — Other Ambulatory Visit: Payer: Self-pay | Admitting: Family

## 2022-11-11 ENCOUNTER — Other Ambulatory Visit: Payer: Self-pay | Admitting: Medical

## 2022-11-24 ENCOUNTER — Telehealth: Payer: Self-pay | Admitting: Medical

## 2022-11-24 MED ORDER — METOPROLOL SUCCINATE ER 25 MG PO TB24: 25.0000 mg | ORAL_TABLET | Freq: Every day | ORAL | 1 refills | Status: DC

## 2022-11-24 NOTE — Telephone Encounter (Signed)
Requested Prescriptions   Signed Prescriptions Disp Refills   metoprolol succinate (TOPROL-XL) 25 MG 24 hr tablet 30 tablet 1    Sig: Take 1 tablet (25 mg total) by mouth daily.    Authorizing Provider: Antony Madura    Ordering User: Raelene Bott, Berea Majkowski L

## 2022-11-24 NOTE — Telephone Encounter (Signed)
*  STAT* If patient is at the pharmacy, call can be transferred to refill team.   1. Which medications need to be refilled? (please list name of each medication and dose if known) metoprolol  2. Which pharmacy/location (including street and city if local pharmacy) is medication to be sent to?[place], [place]  3. Do they need a 30 day or 90 day supply? [place]

## 2022-12-01 ENCOUNTER — Ambulatory Visit: Payer: Medicaid Other | Admitting: Medical

## 2022-12-14 ENCOUNTER — Telehealth: Payer: Self-pay | Admitting: Family

## 2022-12-14 NOTE — Telephone Encounter (Signed)
Pt called in stating that she needs a fill face mask for her CPAP from [facility]. She also needed to make an appt - states that we cancelled an appt on her and needs to get an appt. From her past appts, I see that she was last seen 10.3.23. Please advise pt at [phone]

## 2022-12-15 NOTE — Telephone Encounter (Signed)
Appt set up for pt 3/22 '@1100'$  with Linden Dolin. Pt aware.

## 2022-12-24 ENCOUNTER — Other Ambulatory Visit: Payer: Self-pay | Admitting: Family

## 2022-12-24 NOTE — Progress Notes (Deleted)
Patient ID: Jaime Fitzgerald, female    DOB: [birth year], 62 y.o.   MRN: [id number]  HPI  Jaime Fitzgerald is a 62 year old female with a past medical history of hypertension, acute blood loss anemia with history of GI bleed, asthma, diabetes mellitus type 2, seizures, sleep apnea, dyspnea, obesity.  Echo 11/26/21: EF of 60-65% along with mild LVH. Echo 10/14/21: EF of 45-50% with moderate LV dysfunction, GIII restrictive. Repeat echo scheduled on 11/26/21 by Dr. Fletcher Anon as his review of the echo did not match the above interpretation.   Has not been admitted or been in the ED in the last 6 months.   She presents today for a HF follow-up visit with a chief complaint of    Past Medical History:  Diagnosis Date   Acute blood loss anemia 10/11/2019   Acute GI bleeding 10/11/2019   Anginal pain (HCC)    Arthritis    Asthma    CHF (congestive heart failure) ([place])    Diabetes mellitus without complication ([place])    Dyspnea    Hypertension    Seizures ([place])    Sleep apnea    Past Surgical History:  Procedure Laterality Date   CARPAL TUNNEL RELEASE     COLONOSCOPY WITH PROPOFOL N/A 01/17/2020   Procedure: COLONOSCOPY WITH PROPOFOL;  Surgeon: Lin Landsman, MD;  Location: [facility];  Service: Gastroenterology;  Laterality: N/A;   ESOPHAGOGASTRODUODENOSCOPY N/A 10/13/2019   Procedure: ESOPHAGOGASTRODUODENOSCOPY (EGD);  Surgeon: Virgel Manifold, MD;  Location[facility] ENDOSCOPY;  Service: Endoscopy;  Laterality: N/A;   ESOPHAGOGASTRODUODENOSCOPY N/A 09/18/2020   Procedure: ESOPHAGOGASTRODUODENOSCOPY (EGD);  Surgeon: Toledo, Benay Pike, MD;  Location: [facility] ENDOSCOPY;  Service: Gastroenterology;  Laterality: N/A;   ESOPHAGOGASTRODUODENOSCOPY (EGD) WITH PROPOFOL N/A 10/11/2019   Procedure: ESOPHAGOGASTRODUODENOSCOPY (EGD) WITH PROPOFOL;  Surgeon: Virgel Manifold, MD;  Location: [facility] ENDOSCOPY;  Service: Endoscopy;  Laterality: N/A;   ESOPHAGOGASTRODUODENOSCOPY (EGD) WITH PROPOFOL N/A 01/17/2020    Procedure: ESOPHAGOGASTRODUODENOSCOPY (EGD) WITH PROPOFOL;  Surgeon: Lin Landsman, MD;  Location: [facility] ENDOSCOPY;  Service: Gastroenterology;  Laterality: N/A;   TONSILLECTOMY     Family History  Problem Relation Age of Onset   Heart Problems Mother    Seizures Father    Social History   Tobacco Use   Smoking status: Former    Types: Cigarettes   Smokeless tobacco: Never  Substance Use Topics   Alcohol use: Not Currently   Allergies  Allergen Reactions   Aspirin Nausea Only and Other (See Comments)    GI upset   Penicillins Rash   Past Medical History:  Diagnosis Date   Acute blood loss anemia 10/11/2019   Acute GI bleeding 10/11/2019   Anginal pain (HCC)    Arthritis    Asthma    CHF (congestive heart failure) (HCC)    Diabetes mellitus without complication (HCC)    Dyspnea    Hypertension    Seizures ([place])    Sleep apnea    Past Surgical History:  Procedure Laterality Date   CARPAL TUNNEL RELEASE     COLONOSCOPY WITH PROPOFOL N/A 01/17/2020   Procedure: COLONOSCOPY WITH PROPOFOL;  Surgeon: Lin Landsman, MD;  Location: [facility] ENDOSCOPY;  Service: Gastroenterology;  Laterality: N/A;   ESOPHAGOGASTRODUODENOSCOPY N/A 10/13/2019   Procedure: ESOPHAGOGASTRODUODENOSCOPY (EGD);  Surgeon: Virgel Manifold, MD;  Location: [facility] ENDOSCOPY;  Service: Endoscopy;  Laterality: N/A;   ESOPHAGOGASTRODUODENOSCOPY N/A 09/18/2020   Procedure: ESOPHAGOGASTRODUODENOSCOPY (EGD);  Surgeon: Toledo, Benay Pike, MD;  Location: [facility] ENDOSCOPY;  Service: Gastroenterology;  Laterality: N/A;   ESOPHAGOGASTRODUODENOSCOPY (EGD) WITH PROPOFOL N/A 10/11/2019   Procedure: ESOPHAGOGASTRODUODENOSCOPY (EGD) WITH PROPOFOL;  Surgeon: Virgel Manifold, MD;  Location: [facility] ENDOSCOPY;  Service: Endoscopy;  Laterality: N/A;   ESOPHAGOGASTRODUODENOSCOPY (EGD) WITH PROPOFOL N/A 01/17/2020   Procedure: ESOPHAGOGASTRODUODENOSCOPY (EGD) WITH PROPOFOL;  Surgeon: Lin Landsman, MD;  Location:  [facility] ENDOSCOPY;  Service: Gastroenterology;  Laterality: N/A;   TONSILLECTOMY     Family History  Problem Relation Age of Onset   Heart Problems Mother    Seizures Father    Social History   Tobacco Use   Smoking status: Former    Types: Cigarettes   Smokeless tobacco: Never  Substance Use Topics   Alcohol use: Not Currently   Allergies  Allergen Reactions   Aspirin Nausea Only and Other (See Comments)    GI upset   Penicillins Rash      Review of Systems  Constitutional:  Positive for fatigue. Negative for appetite change.  HENT:  Negative for congestion, postnasal drip and sore throat.   Eyes: Negative.   Respiratory:  Positive for cough and shortness of breath.   Cardiovascular:  Negative for chest pain, palpitations and leg swelling.  Gastrointestinal:  Negative for abdominal distention and abdominal pain.  Endocrine: Negative.   Genitourinary: Negative.   Musculoskeletal:  Positive for back pain. Negative for neck pain.  Skin: Negative.   Allergic/Immunologic: Negative.   Neurological:  Positive for dizziness. Negative for light-headedness.  Hematological:  Negative for adenopathy. Does not bruise/bleed easily.  Psychiatric/Behavioral:  Positive for sleep disturbance (sleeping on 3 pillows with oxygen at 2.5L). Negative for dysphoric mood. The patient is not nervous/anxious.      Physical Exam Vitals and nursing note reviewed. Exam conducted with a chaperone present (friend Jaime Fitzgerald).  Constitutional:      Appearance: Normal appearance.  HENT:     Head: Normocephalic and atraumatic.  Cardiovascular:     Rate and Rhythm: Regular rhythm. Tachycardia present.  Pulmonary:     Effort: Pulmonary effort is normal. No respiratory distress.     Breath sounds: No wheezing or rales.  Abdominal:     General: There is no distension.     Palpations: Abdomen is soft.  Musculoskeletal:        General: No tenderness.     Cervical back: Normal range of motion and neck  supple.     Right lower leg: Edema (trace pitting) present.     Left lower leg: Edema (trace pitting) present.  Skin:    General: Skin is warm and dry.  Neurological:     General: No focal deficit present.     Mental Status: She is alert and oriented to person, place, and time.  Psychiatric:        Mood and Affect: Mood normal.        Behavior: Behavior normal.        Thought Content: Thought content normal.     Assessment and Plan:  Chronic heart failure with preserved ejection fraction with structural changes (LVH)- - NYHA II today - euvolemic - weighing daily; reminded to weigh daily and report weight gain of > 3lb/night or 5 lbs/week - weight 207.2 pounds from last visit here 6 months ago - using NoSalt as her seasoning - saw cardiology Jaime Fitzgerald) on 08/31/22 - on GDMT of jardiance - consider adding entresto - BNP 10/13/21 was 8.4  2. HTN - - BP  - needs to schedule f/u appt with Jaime Fitzgerald  clinic  - BMP 08/31/22 reviewed and showed sodium 142, potassium 4.1, creatinine 1.17 and GFR 53   3. Tobacco abuse - - smoking 1 cigarette day - encouraged complete cessation  4: DM- - A1c 10/14/21 was 7.2% - home glucose this morning was

## 2022-12-25 ENCOUNTER — Encounter: Payer: Medicaid Other | Admitting: Family

## 2022-12-25 ENCOUNTER — Telehealth: Payer: Self-pay | Admitting: Family

## 2022-12-25 NOTE — Telephone Encounter (Signed)
Patient did not show for her Heart Failure Clinic appointment on 12/25/22.

## 2023-01-05 ENCOUNTER — Encounter: Payer: Self-pay | Admitting: Medical

## 2023-01-05 ENCOUNTER — Ambulatory Visit: Payer: Medicaid Other | Attending: Medical | Admitting: Medical

## 2023-01-05 NOTE — Progress Notes (Deleted)
Cardiology Office Note:    Date:  01/05/2023   ID:  Jaime Fitzgerald, DOB [birth year], MRN [id number]  PCP:  Alisa Graff, FNP  [facility] Cardiologist:  Kathlyn Sacramento, MD  [facility] Electrophysiologist:  None   Referring MD: Center, Imogene   Chief Complaint: ***  History of Present Illness:    Jaime Fitzgerald is a 62 y.o. female with a hx of  HFmrEF, HTN, previous GIB with blood loss anemia, asthma/COPD, tobacco use, DM2, OSA and obesity who presents for 4-6 week follow-up.   She was hospitalized in January 2023 for shortness of breath. CTA showed small pericardial effusion and cardiomegaly. Echo showed LVEF 45-50%, G3DD. The patient was diuresed. She continued to be hypoxic and was sent home with 2L O2. Dr. Fletcher Anon reviewed echo and felt LVEF was normal with G1DD and mild LVH.    Seen in the office 10/2021 and lasix was increased. A limited echo was ordered. This showed LVEF 60-65%, mild LVH and small pericardial effusion. She also follows with the Heart failure clinic.    The patient was seen 07/23/22 reporting sharp occasional chest pain, SOB and cough. She did report URI. A cardiac CTA was ordered, however the office was unable to contact the patient and this was not performed.  Patient was last seen November 2023 and was willing to reschedule cardiac CTA.  She ran out of Lasix and this was refilled.  Patient reported dizziness, she ran out of meclizine. Orthostatics were negative.  Today, Repeat limited echo for pericardial effusion short of breath  Past Medical History:  Diagnosis Date   Acute blood loss anemia 10/11/2019   Acute GI bleeding 10/11/2019   Anginal pain (HCC)    Arthritis    Asthma    CHF (congestive heart failure) (HCC)    Diabetes mellitus without complication (HCC)    Dyspnea    Hypertension    Seizures ([place])    Sleep apnea     Past Surgical History:  Procedure Laterality Date   CARPAL TUNNEL RELEASE     COLONOSCOPY WITH PROPOFOL  N/A 01/17/2020   Procedure: COLONOSCOPY WITH PROPOFOL;  Surgeon: Lin Landsman, MD;  Location: [facility] ENDOSCOPY;  Service: Gastroenterology;  Laterality: N/A;   ESOPHAGOGASTRODUODENOSCOPY N/A 10/13/2019   Procedure: ESOPHAGOGASTRODUODENOSCOPY (EGD);  Surgeon: Virgel Manifold, MD;  Location: [facility] ENDOSCOPY;  Service: Endoscopy;  Laterality: N/A;   ESOPHAGOGASTRODUODENOSCOPY N/A 09/18/2020   Procedure: ESOPHAGOGASTRODUODENOSCOPY (EGD);  Surgeon: Toledo, Benay Pike, MD;  Location: [facility] ENDOSCOPY;  Service: Gastroenterology;  Laterality: N/A;   ESOPHAGOGASTRODUODENOSCOPY (EGD) WITH PROPOFOL N/A 10/11/2019   Procedure: ESOPHAGOGASTRODUODENOSCOPY (EGD) WITH PROPOFOL;  Surgeon: Virgel Manifold, MD;  Location: [facility] ENDOSCOPY;  Service: Endoscopy;  Laterality: N/A;   ESOPHAGOGASTRODUODENOSCOPY (EGD) WITH PROPOFOL N/A 01/17/2020   Procedure: ESOPHAGOGASTRODUODENOSCOPY (EGD) WITH PROPOFOL;  Surgeon: Lin Landsman, MD;  Location: [facility] ENDOSCOPY;  Service: Gastroenterology;  Laterality: N/A;   TONSILLECTOMY      Current Medications: No outpatient medications have been marked as taking for the 01/05/23 encounter (Appointment) with Kathlen Mody, Jabes Primo H, PA-C.     Allergies:   Aspirin and Penicillins   Social History   Socioeconomic History   Marital status: Legally Separated    Spouse name: Not on file   Number of children: Not on file   Years of education: Not on file   Highest education level: Not on file  Occupational History   Not on file  Tobacco Use   Smoking status: Former  Types: Cigarettes   Smokeless tobacco: Never  Substance and Sexual Activity   Alcohol use: Not Currently   Drug use: Not Currently   Sexual activity: Not on file  Other Topics Concern   Not on file  Social History Narrative   Not on file   Social Determinants of Health   Financial Resource Strain: Not on file  Food Insecurity: Not on file  Transportation Needs: Not on file  Physical Activity: Not on  file  Stress: Not on file  Social Connections: Not on file     Family History: The patient's ***family history includes Heart Problems in her mother; Seizures in her father.  ROS:   Please see the history of present illness.    *** All other systems reviewed and are negative.  EKGs/Labs/Other Studies Reviewed:    The following studies were reviewed today: ***  EKG:  EKG is *** ordered today.  The ekg ordered today demonstrates ***  Recent Labs: 07/23/2022: B Natriuretic Peptide 4.5; Hemoglobin 15.0; Platelets 212 08/31/2022: BUN 21; Creatinine, Ser 1.17; Potassium 4.1; Sodium 142  Recent Lipid Panel    Component Value Date/Time   CHOL 160 08/31/2022 1516   TRIG 205 (H) 08/31/2022 1516   HDL 59 08/31/2022 1516   CHOLHDL 2.7 08/31/2022 1516   VLDL 41 (H) 08/31/2022 1516   LDLCALC 60 08/31/2022 1516   LDLDIRECT 82 08/31/2022 1516     Risk Assessment/Calculations:   {Does this patient have ATRIAL FIBRILLATION?[phone]}   Physical Exam:    VS:  There were no vitals taken for this visit.    Wt Readings from Last 3 Encounters:  08/31/22 213 lb 6.4 oz (96.8 kg)  07/23/22 210 lb 3.2 oz (95.3 kg)  07/07/22 207 lb 2 oz (94 kg)     GEN: *** Well nourished, well developed in no acute distress HEENT: Normal NECK: No JVD; No carotid bruits LYMPHATICS: No lymphadenopathy CARDIAC: ***RRR, no murmurs, rubs, gallops RESPIRATORY:  Clear to auscultation without rales, wheezing or rhonchi  ABDOMEN: Soft, non-tender, non-distended MUSCULOSKELETAL:  No edema; No deformity  SKIN: Warm and dry NEUROLOGIC:  Alert and oriented x 3 PSYCHIATRIC:  Normal affect   ASSESSMENT:    No diagnosis found. PLAN:    In order of problems listed above:  ***  Disposition: Follow up {follow up:15908} with ***   Shared Decision Making/Informed Consent   {Are you ordering a CV Procedure (e.g. stress test, cath, DCCV, TEE, etc)?   Press F2        :[id number]    Signed, Rukia Mcgillivray Ninfa Meeker, PA-C  01/05/2023 9:42 AM    [facility]

## 2023-01-12 ENCOUNTER — Ambulatory Visit
Admission: RE | Admit: 2023-01-12 | Discharge: 2023-01-12 | Disposition: A | Discharge status: Home / Self Care | Payer: Medicaid Other | Source: Ambulatory Visit | Attending: Family | Admitting: Family

## 2023-01-12 ENCOUNTER — Ambulatory Visit (HOSPITAL_BASED_OUTPATIENT_CLINIC_OR_DEPARTMENT_OTHER): Payer: Medicaid Other | Admitting: Family

## 2023-01-12 ENCOUNTER — Other Ambulatory Visit: Payer: Self-pay | Admitting: Family

## 2023-01-12 ENCOUNTER — Telehealth: Payer: Self-pay

## 2023-01-12 ENCOUNTER — Encounter: Payer: Self-pay | Admitting: Family

## 2023-01-12 VITALS — BP 138/79 | HR 83 | Resp 18 | Wt 211.2 lb

## 2023-01-12 DIAGNOSIS — I10 Essential (primary) hypertension: Secondary | ICD-10-CM

## 2023-01-12 DIAGNOSIS — I5033 Acute on chronic diastolic (congestive) heart failure: Secondary | ICD-10-CM

## 2023-01-12 DIAGNOSIS — Z9981 Dependence on supplemental oxygen: Secondary | ICD-10-CM | POA: Insufficient documentation

## 2023-01-12 DIAGNOSIS — J4489 Other specified chronic obstructive pulmonary disease: Secondary | ICD-10-CM | POA: Insufficient documentation

## 2023-01-12 DIAGNOSIS — I110 Hypertensive heart disease with heart failure: Secondary | ICD-10-CM | POA: Insufficient documentation

## 2023-01-12 DIAGNOSIS — Z720 Tobacco use: Secondary | ICD-10-CM | POA: Diagnosis not present

## 2023-01-12 DIAGNOSIS — R0609 Other forms of dyspnea: Secondary | ICD-10-CM

## 2023-01-12 DIAGNOSIS — G4730 Sleep apnea, unspecified: Secondary | ICD-10-CM | POA: Insufficient documentation

## 2023-01-12 DIAGNOSIS — E119 Type 2 diabetes mellitus without complications: Secondary | ICD-10-CM | POA: Insufficient documentation

## 2023-01-12 DIAGNOSIS — R140 Abdominal distension (gaseous): Secondary | ICD-10-CM | POA: Insufficient documentation

## 2023-01-12 DIAGNOSIS — J449 Chronic obstructive pulmonary disease, unspecified: Secondary | ICD-10-CM

## 2023-01-12 DIAGNOSIS — E669 Obesity, unspecified: Secondary | ICD-10-CM | POA: Insufficient documentation

## 2023-01-12 DIAGNOSIS — I509 Heart failure, unspecified: Secondary | ICD-10-CM | POA: Diagnosis not present

## 2023-01-12 DIAGNOSIS — M549 Dorsalgia, unspecified: Secondary | ICD-10-CM | POA: Insufficient documentation

## 2023-01-12 DIAGNOSIS — F17210 Nicotine dependence, cigarettes, uncomplicated: Secondary | ICD-10-CM | POA: Insufficient documentation

## 2023-01-12 DIAGNOSIS — Z7984 Long term (current) use of oral hypoglycemic drugs: Secondary | ICD-10-CM | POA: Insufficient documentation

## 2023-01-12 DIAGNOSIS — R058 Other specified cough: Secondary | ICD-10-CM | POA: Insufficient documentation

## 2023-01-12 DIAGNOSIS — Z79899 Other long term (current) drug therapy: Secondary | ICD-10-CM | POA: Insufficient documentation

## 2023-01-12 LAB — BASIC METABOLIC PANEL
Anion gap: 9 (ref 5–15)
BUN: 17 mg/dL (ref 8–23)
CO2: 30 mmol/L (ref 22–32)
Calcium: 8.8 mg/dL — ABNORMAL LOW (ref 8.9–10.3)
Chloride: 98 mmol/L (ref 98–111)
Creatinine, Ser: 1.18 mg/dL — ABNORMAL HIGH (ref 0.44–1.00)
GFR, Estimated: 52 mL/min — ABNORMAL LOW (ref >60)
Glucose, Bld: 103 mg/dL — ABNORMAL HIGH (ref 70–99)
Potassium: 3.5 mmol/L (ref 3.5–5.1)
Sodium: 137 mmol/L (ref 135–145)

## 2023-01-12 LAB — BRAIN NATRIURETIC PEPTIDE: B Natriuretic Peptide: 19.8 pg/mL (ref 0.0–100.0)

## 2023-01-12 MED ORDER — POTASSIUM CHLORIDE CRYS ER 20 MEQ PO TBCR
EXTENDED_RELEASE_TABLET | ORAL | Status: AC
  Filled 2023-01-12: qty 2

## 2023-01-12 MED ORDER — METOLAZONE 2.5 MG PO TABS: ORAL_TABLET | ORAL | 3 refills | Status: DC

## 2023-01-12 MED ORDER — FUROSEMIDE 10 MG/ML IJ SOLN
INTRAMUSCULAR | Status: AC
  Filled 2023-01-12: qty 8

## 2023-01-12 MED ORDER — POTASSIUM CHLORIDE CRYS ER 20 MEQ PO TBCR: EXTENDED_RELEASE_TABLET | ORAL | 3 refills | Status: DC

## 2023-01-12 MED ORDER — FUROSEMIDE 10 MG/ML IJ SOLN
80.0000 mg | Freq: Once | INTRAMUSCULAR | Status: AC
  Administered 2023-01-12: 80 mg via INTRAVENOUS

## 2023-01-12 MED ORDER — POTASSIUM CHLORIDE CRYS ER 20 MEQ PO TBCR
40.0000 meq | EXTENDED_RELEASE_TABLET | Freq: Once | ORAL | Status: AC
  Administered 2023-01-12: 40 meq via ORAL

## 2023-01-12 NOTE — Patient Instructions (Addendum)
Stop jardiance   Do NOT add any salt to any of your food and avoid eating salty foods like potato chips etc

## 2023-01-12 NOTE — Telephone Encounter (Signed)
  Spoke with Dorene Sorrow and pt over the phone regarding Lab results.  Dorene Sorrow and pt were given medication education with teach back regarding indications of Metolazone 2.5 mg to be taken on Wednesday, Friday and Sunday. Take this medication 30 minutes prior to taking fluid pill (lasix). Take Potassium 60 mEq (3 tablets) on Wednesday, Friday and Sunday with Metolazone.  Med orders placed and sent to requested pharmacy. Salt precautions education given with teach back.  Pt and Dorene Sorrow aware, agreeable, and verbalized understanding.     Per Clarisa Kindred, FNP: Please call her friend Dorene Sorrow: Kidney function looks good so begin metolazone (booster fluid pill) 2.5mg  Wed, Friday & Sunday. Take this 1/2 hour before morning fluid pill. On Wed, Friday and Sunday, you will also take 3 potassium tablets on each one of those days ( each day). Do not add salt and do not eat potato chips and other salty foods

## 2023-01-12 NOTE — Progress Notes (Signed)
Patient ID: Jaime Fitzgerald, female    DOB: October 05, 1961, 62 y.o.   MRN: [id number]  Primary cardiologist: Lorine BearsMuhammad Arida, MD (last seen 11/23) PCP: [facility], [facility] (last seen 03/24)  HPI  Jaime Fitzgerald is a 62 year old female with a past medical history of hypertension, acute blood loss anemia with history of GI bleed, asthma, diabetes mellitus type 2, seizures, sleep apnea, dyspnea, obesity.  Echo 11/26/21: EF of 60-65% along with mild LVH. Echo 10/14/21: EF of 45-50% with moderate LV dysfunction, GIII restrictive. Repeat echo scheduled on 11/26/21 by Dr. Kirke CorinArida as his review of the echo did not match the above interpretation.   Has not been admitted or been in the ED in the last 6 months.   She presents today for a HF follow-up visit with a chief complaint of  moderate SOB with little exertion. Describes this as chronic although worse over the last 2 weeks. Has fatigue, productive cough, abdominal distention/ pain, vomiting after coughing hard, back pain, light-headedness, weight gain and orthopnea along with this. Denies palpitations, pedal edema or chest pain.  Says that she feels like the jardiance has caused her swelling to worsen. She stopped it for 2 days and her symptoms improved so she then resumed taking it. She also says that the pravastatin also caused swelling so she did stop taking that.   She says that she is drinking ~ 60 ounces of fluid daily. She admits to adding salt to her food because "it doesn't taste good". Is also eating potato chips, other salty snacks and frozen dinners.   She currently has oxygen at 2L at bedtime but says that it keeps falling off and needs "a mask".  Past Medical History:  Diagnosis Date   Acute blood loss anemia 10/11/2019   Acute GI bleeding 10/11/2019   Anginal pain (HCC)    Arthritis    Asthma    CHF (congestive heart failure) (HCC)    Diabetes mellitus without complication (HCC)    Dyspnea    Hypertension    Seizures  (HCC)    Sleep apnea    Past Surgical History:  Procedure Laterality Date   CARPAL TUNNEL RELEASE     COLONOSCOPY WITH PROPOFOL N/A 01/17/2020   Procedure: COLONOSCOPY WITH PROPOFOL;  Surgeon: Toney ReilVanga, Rohini Reddy, MD;  Location: [facility] ENDOSCOPY;  Service: Gastroenterology;  Laterality: N/A;   ESOPHAGOGASTRODUODENOSCOPY N/A 10/13/2019   Procedure: ESOPHAGOGASTRODUODENOSCOPY (EGD);  Surgeon: Pasty Spillersahiliani, Varnita B, MD;  [facility];  Service: Endoscopy;  Laterality: N/A;   ESOPHAGOGASTRODUODENOSCOPY N/A 09/18/2020   Procedure: ESOPHAGOGASTRODUODENOSCOPY (EGD);  Surgeon: Toledo, Boykin Nearingeodoro K, MD;  Location: [facility] ENDOSCOPY;  Service: Gastroenterology;  Laterality: N/A;   ESOPHAGOGASTRODUODENOSCOPY (EGD) WITH PROPOFOL N/A 10/11/2019   Procedure: ESOPHAGOGASTRODUODENOSCOPY (EGD) WITH PROPOFOL;  Surgeon: Pasty Spillersahiliani, Varnita B, MD;  Location: [facility] ENDOSCOPY;  Service: Endoscopy;  Laterality: N/A;   ESOPHAGOGASTRODUODENOSCOPY (EGD) WITH PROPOFOL N/A 01/17/2020   Procedure: ESOPHAGOGASTRODUODENOSCOPY (EGD) WITH PROPOFOL;  Surgeon: Toney ReilVanga, Rohini Reddy, MD;  [facility];  Service: Gastroenterology;  Laterality: N/A;   TONSILLECTOMY     Family History  Problem Relation Age of Onset   Heart Problems Mother    Seizures Father    Social History   Tobacco Use   Smoking status: Former    Types: Cigarettes   Smokeless tobacco: Never  Substance Use Topics   Alcohol use: Not Currently   Allergies  Allergen Reactions   Aspirin Nausea Only and Other (See Comments)    GI  upset   Penicillins Rash    Prior to Admission medications   Medication Sig Start Date End Date Taking? Authorizing Provider  albuterol (VENTOLIN HFA) 108 (90 Base) MCG/ACT inhaler Inhale 2 puffs into the lungs every 6 (six) hours as needed for wheezing or shortness of breath. 10/14/19  Yes Lynn Ito, MD  amLODipine (NORVASC) 5 MG tablet TAKE ONE TABLET BY MOUTH DAILY 09/23/22  Yes Iran Ouch, MD   cyclobenzaprine (FLEXERIL) 10 MG tablet Take 10 mg by mouth as needed for muscle spasms.   Yes [provider]  Fluticasone Propionate, Inhal, (FLOVENT IN) Inhale into the lungs as needed.   Yes [provider]  furosemide (LASIX) 40 MG tablet TAKE ONE TABLET BY MOUTH DAILY 11/12/22  Yes Furth, Cadence H, PA-C  glipiZIDE (GLUCOTROL XL) 10 MG 24 hr tablet Take 10 mg by mouth 2 (two) times daily. 10/27/21  Yes [provider]  JARDIANCE 10 MG TABS tablet TAKE ONE TABLET BY MOUTH EVERY DAY BEFORE BREAKFAST 12/24/22  Yes Clarisa Kindred A, FNP  metoprolol succinate (TOPROL-XL) 25 MG 24 hr tablet Take 1 tablet (25 mg total) by mouth daily. 11/24/22  Yes Furth, Cadence H, PA-C  pantoprazole (PROTONIX) 40 MG tablet Take 1 tablet (40 mg total) by mouth daily. 08/31/22  Yes Furth, Cadence H, PA-C  pravastatin (PRAVACHOL) 40 MG tablet TAKE ONE TABLET BY MOUTH EVERY DAY 11/07/22  Yes Clarisa Kindred A, FNP  vitamin B-12 (CYANOCOBALAMIN) 1000 MCG tablet Take 1 tablet (1,000 mcg total) by mouth daily. 08/05/21  Yes Vanga, Loel Dubonnet, MD  meclizine (ANTIVERT) 12.5 MG tablet Take 1 tablet (12.5 mg total) by mouth 3 (three) times daily as needed for up to 30 doses for dizziness. Patient not taking: Reported on 01/12/2023 08/31/22   Furth, Cadence H, PA-C  metoprolol tartrate (LOPRESSOR) 100 MG tablet Take 1 tablet (100 mg) by mouth 2 hours prior to your Cardiac CT 07/23/22   Furth, Cadence H, PA-C  witch hazel-glycerin (TUCKS) pad Apply topically as needed for itching. Patient not taking: Reported on 01/12/2023 10/17/21   Tresa Moore, MD    Review of Systems  Constitutional:  Positive for fatigue. Negative for appetite change.  HENT:  Negative for congestion, postnasal drip and sore throat.   Eyes: Negative.   Respiratory:  Positive for cough (productive) and shortness of breath.   Cardiovascular:  Negative for chest pain, palpitations and leg swelling.  Gastrointestinal:  Positive for  abdominal distention, abdominal pain and vomiting (after coughing).  Endocrine: Negative.   Genitourinary: Negative.   Musculoskeletal:  Positive for back pain. Negative for neck pain.  Skin: Negative.   Allergic/Immunologic: Negative.   Neurological:  Positive for dizziness and light-headedness.  Hematological:  Negative for adenopathy. Does not bruise/bleed easily.  Psychiatric/Behavioral:  Positive for sleep disturbance (sleeping on 3 pillows with oxygen at 2.L). Negative for dysphoric mood. The patient is not nervous/anxious.    Vitals:   01/12/23 1358  BP: 138/79  Pulse: 83  Resp: 18  SpO2: (!) 88%  Weight: 211 lb 4 oz (95.8 kg)   Wt Readings from Last 3 Encounters:  01/12/23 211 lb 4 oz (95.8 kg)  08/31/22 213 lb 6.4 oz (96.8 kg)  07/23/22 210 lb 3.2 oz (95.3 kg)   Lab Results  Component Value Date   CREATININE 1.17 (H) 08/31/2022   CREATININE 1.11 (H) 07/23/2022   CREATININE 1.50 (H) 02/11/2022   Physical Exam Vitals and nursing note reviewed. Exam conducted with  a chaperone present (friend Dorene Sorrow).  Constitutional:      Appearance: Normal appearance.  HENT:     Head: Normocephalic and atraumatic.  Neck:     Vascular: JVD present.  Cardiovascular:     Rate and Rhythm: Normal rate and regular rhythm.  Pulmonary:     Effort: Pulmonary effort is normal. No respiratory distress.     Breath sounds: No wheezing or rales.  Abdominal:     General: There is distension.     Palpations: Abdomen is soft.  Musculoskeletal:        General: No tenderness.     Cervical back: Normal range of motion and neck supple.     Right lower leg: Edema (1+ pitting) present.     Left lower leg: Edema (1+ pitting) present.  Skin:    General: Skin is warm and dry.  Neurological:     General: No focal deficit present.     Mental Status: She is alert and oriented to person, place, and time.  Psychiatric:        Mood and Affect: Mood normal.        Behavior: Behavior normal.         Thought Content: Thought content normal.     Assessment and Plan:  Acute on Chronic heart failure with preserved ejection fraction- - NYHA III today - fluid overloaded today with worsening SOB/ abdominal distention and elevated ReDs reading - weighing daily; reminded to weigh daily and report weight gain of > 3lb/night or 5 lbs/week - weight up 4 pounds from last visit here 6 months ago - Echo 11/26/21: EF of 60-65% along with mild LVH. Echo 10/14/21: EF of 45-50% with moderate LV dysfunction, GIII restrictive. Repeat echo scheduled on 11/26/21 by Dr. Kirke Corin as his review of the echo did not match the above interpretation.  - ReDs reading 52% - will send for 80mg  IV lasix/ PO potassium - BMP/ BNP today - wanted her to return tomorrow or later this week but she needs at least 3 days notice for ACTA - depending on lab results, will probably also need metolazone/ potassium but will hold off until these results are back - using regular salt as well as eating salty snacks/ foods; emphasized not doing this anymore - saw cardiology Fransico Michael) on 08/31/22; this needs to get scheduled but too symptomatic at this time to discuss - continue furosemide 40mg  daily - on GDMT of jardiance although she says this has made her swell and when she stopped it for a couple of days, symptoms improved; advised to stop taking jardiance - BNP 10/13/21 was 8.4  2. HTN - - BP 138/79 - [facility] (she thinks) - BMP 08/31/22 reviewed and showed sodium 142, potassium 4.1, creatinine 1.17 and GFR 53  3. Tobacco abuse - - smoking 1 cigarette day - encouraged complete cessation  4: DM- - A1c 10/14/21 was 7.2%  5: COPD/ asthma- - wearing oxygen at 2L but keeps saying that it doesn't stay in her nose and she needs a mask to wear instead - emphasized that she needed to contact the ordering provider for her oxygen as it doesn't look like she's had a sleep study done   Lengthy discussion w/  patient and her friend, Dorene Sorrow,  that the IV lasix today may not be enough to keep her out of the ER. Explained that I could send her to the ER today but she becomes upset saying she  doesn't want to go. Emphasized that should her symptoms worsen, she needs to call 911 and she confirms that she will do so.   Will contact her and Dorene Sorrow after her labs come back today to decide about metolazone.

## 2023-01-12 NOTE — Progress Notes (Signed)
ReDS Vest / Clip - 01/12/23 1418       ReDS Vest / Clip   Station Marker A    Ruler Value 27    ReDS Value Range High volume overload    ReDS Actual Value 52

## 2023-01-18 ENCOUNTER — Encounter: Payer: Medicaid Other | Admitting: Family

## 2023-01-18 ENCOUNTER — Telehealth: Payer: Self-pay | Admitting: Family

## 2023-01-18 NOTE — Telephone Encounter (Signed)
Patient did not show for her Heart Failure Clinic appointment on 01/18/23.

## 2023-01-18 NOTE — Progress Notes (Deleted)
Patient ID: Jaime Fitzgerald, female    DOB: 21-Nov-1960, 62 y.o.   MRN: [id number]  Primary cardiologist: Lorine Bears, MD (last seen 11/23) PCP: [facility] (last seen 03/24)  HPI  Jaime Fitzgerald is a 62 year old female with a past medical history of hypertension, acute blood loss anemia with history of GI bleed, asthma, diabetes mellitus type 2, seizures, sleep apnea, dyspnea, obesity.  Echo 11/26/21: EF of 60-65% along with mild LVH. Echo 10/14/21: EF of 45-50% with moderate LV dysfunction, GIII restrictive.    Has not been admitted or been in the ED in the last 6 months.   She presents today for a HF follow-up visit with a chief complaint of    Took 3 doses of metolazone 2.5mg   along with extra potassium since she was last here.   She says that she is drinking ~ 60 ounces of fluid daily. She admits to adding salt to her food because "it doesn't taste good". Is also eating potato chips, other salty snacks and frozen dinners.   She currently has oxygen at 2L at bedtime but says that it keeps falling off and needs "a mask".  Past Medical History:  Diagnosis Date   Acute blood loss anemia 10/11/2019   Acute GI bleeding 10/11/2019   Anginal pain    Arthritis    Asthma    CHF (congestive heart failure)    Diabetes mellitus without complication    Dyspnea    Hypertension    Seizures    Sleep apnea    Past Surgical History:  Procedure Laterality Date   CARPAL TUNNEL RELEASE     COLONOSCOPY WITH PROPOFOL N/A 01/17/2020   Procedure: COLONOSCOPY WITH PROPOFOL;  Surgeon: Toney Reil, MD;  Location: [facility] ENDOSCOPY;  Service: Gastroenterology;  Laterality: N/A;   ESOPHAGOGASTRODUODENOSCOPY N/A 10/13/2019   Procedure: ESOPHAGOGASTRODUODENOSCOPY (EGD);  Surgeon: Pasty Spillers, MD;  Location: [facility] ENDOSCOPY;  Service: Endoscopy;  Laterality: N/A;   ESOPHAGOGASTRODUODENOSCOPY N/A 09/18/2020   Procedure: ESOPHAGOGASTRODUODENOSCOPY (EGD);  Surgeon:  Toledo, Boykin Nearing, MD;  Location: [facility] ENDOSCOPY;  Service: Gastroenterology;  Laterality: N/A;   ESOPHAGOGASTRODUODENOSCOPY (EGD) WITH PROPOFOL N/A 10/11/2019   Procedure: ESOPHAGOGASTRODUODENOSCOPY (EGD) WITH PROPOFOL;  Surgeon: Pasty Spillers, MD;  Location: [facility] ENDOSCOPY;  Service: Endoscopy;  Laterality: N/A;   ESOPHAGOGASTRODUODENOSCOPY (EGD) WITH PROPOFOL N/A 01/17/2020   Procedure: ESOPHAGOGASTRODUODENOSCOPY (EGD) WITH PROPOFOL;  Surgeon: Toney Reil, MD;  Location: [facility] ENDOSCOPY;  Service: Gastroenterology;  Laterality: N/A;   TONSILLECTOMY     Family History  Problem Relation Age of Onset   Heart Problems Mother    Seizures Father    Social History   Tobacco Use   Smoking status: Former    Types: Cigarettes   Smokeless tobacco: Never  Substance Use Topics   Alcohol use: Not Currently   Allergies  Allergen Reactions   Aspirin Nausea Only and Other (See Comments)    GI upset   Penicillins Rash      Review of Systems  Constitutional:  Positive for fatigue. Negative for appetite change.  HENT:  Negative for congestion, postnasal drip and sore throat.   Eyes: Negative.   Respiratory:  Positive for cough (productive) and shortness of breath.   Cardiovascular:  Negative for chest pain, palpitations and leg swelling.  Gastrointestinal:  Positive for abdominal distention, abdominal pain and vomiting (after coughing).  Endocrine: Negative.   Genitourinary: Negative.   Musculoskeletal:  Positive for back pain. Negative for neck pain.  Skin: Negative.   Allergic/Immunologic: Negative.   Neurological:  Positive for dizziness and light-headedness.  Hematological:  Negative for adenopathy. Does not bruise/bleed easily.  Psychiatric/Behavioral:  Positive for sleep disturbance (sleeping on 3 pillows with oxygen at 2.L). Negative for dysphoric mood. The patient is not nervous/anxious.      Physical Exam Vitals and nursing note reviewed. Exam conducted with a  chaperone present (friend Dorene Sorrow).  Constitutional:      Appearance: Normal appearance.  HENT:     Head: Normocephalic and atraumatic.  Neck:     Vascular: JVD present.  Cardiovascular:     Rate and Rhythm: Normal rate and regular rhythm.  Pulmonary:     Effort: Pulmonary effort is normal. No respiratory distress.     Breath sounds: No wheezing or rales.  Abdominal:     General: There is distension.     Palpations: Abdomen is soft.  Musculoskeletal:        General: No tenderness.     Cervical back: Normal range of motion and neck supple.     Right lower leg: Edema (1+ pitting) present.     Left lower leg: Edema (1+ pitting) present.  Skin:    General: Skin is warm and dry.  Neurological:     General: No focal deficit present.     Mental Status: She is alert and oriented to person, place, and time.  Psychiatric:        Mood and Affect: Mood normal.        Behavior: Behavior normal.        Thought Content: Thought content normal.     Assessment and Plan:  Acute on Chronic heart failure with preserved ejection fraction- - NYHA III today - fluid overloaded today with worsening SOB/ abdominal distention and elevated ReDs reading - weighing daily; reminded to weigh daily and report weight gain of > 3lb/night or 5 lbs/week - weight 211.4 pounds from last visit here 6 days ago - Echo 11/26/21: EF of 60-65% along with mild LVH. Echo 10/14/21: EF of 45-50% with moderate LV dysfunction, GIII restrictive.  - ReDs reading 52% - has taken 3 doses of 2.5mg  metolazone with extra potassium since she was last here - BMP today - using regular salt as well as eating salty snacks/ foods; emphasized not doing this anymore - saw cardiology Fransico Michael) on 08/31/22; this needs to get scheduled but too symptomatic at this time to discuss - continue furosemide 40mg  daily - on GDMT of jardiance although she says this has made her swell and when she stopped it for a couple of days, symptoms improved;  advised to stop taking jardiance - BNP 01/12/23 was 19.8  2. HTN - - BP  - [facility] last month (she thinks) - BMP 01/12/23 reviewed and showed sodium 137, potassium 3.5, creatinine 1.18 and GFR 52  3. Tobacco abuse - - smoking 1 cigarette day - encouraged complete cessation  4: DM- - A1c 10/14/21 was 7.2%  5: COPD/ asthma- - wearing oxygen at 2L but keeps saying that it doesn't stay in her nose and she needs a mask to wear instead - emphasized that she needed to contact the ordering provider for her oxygen as it doesn't look like she's had a sleep study done

## 2023-01-21 ENCOUNTER — Other Ambulatory Visit: Payer: Self-pay | Admitting: Medical

## 2023-01-29 ENCOUNTER — Other Ambulatory Visit: Payer: Self-pay

## 2023-01-29 ENCOUNTER — Inpatient Hospital Stay
Admission: EM | Admit: 2023-01-29 | Discharge: 2023-02-03 | DRG: 698 | Disposition: A | Discharge status: Home / Self Care | Payer: Medicaid Other | Attending: Student | Admitting: Student

## 2023-01-29 ENCOUNTER — Emergency Department: Payer: Medicaid Other

## 2023-01-29 DIAGNOSIS — R609 Edema, unspecified: Secondary | ICD-10-CM | POA: Diagnosis present

## 2023-01-29 DIAGNOSIS — Z7984 Long term (current) use of oral hypoglycemic drugs: Secondary | ICD-10-CM | POA: Diagnosis not present

## 2023-01-29 DIAGNOSIS — J4489 Other specified chronic obstructive pulmonary disease: Secondary | ICD-10-CM | POA: Diagnosis present

## 2023-01-29 DIAGNOSIS — E8770 Fluid overload, unspecified: Principal | ICD-10-CM

## 2023-01-29 DIAGNOSIS — E119 Type 2 diabetes mellitus without complications: Secondary | ICD-10-CM

## 2023-01-29 DIAGNOSIS — N1831 Chronic kidney disease, stage 3a: Secondary | ICD-10-CM | POA: Diagnosis present

## 2023-01-29 DIAGNOSIS — I129 Hypertensive chronic kidney disease with stage 1 through stage 4 chronic kidney disease, or unspecified chronic kidney disease: Secondary | ICD-10-CM | POA: Diagnosis present

## 2023-01-29 DIAGNOSIS — K5900 Constipation, unspecified: Secondary | ICD-10-CM | POA: Diagnosis present

## 2023-01-29 DIAGNOSIS — H1013 Acute atopic conjunctivitis, bilateral: Secondary | ICD-10-CM | POA: Diagnosis present

## 2023-01-29 DIAGNOSIS — Z79899 Other long term (current) drug therapy: Secondary | ICD-10-CM | POA: Diagnosis not present

## 2023-01-29 DIAGNOSIS — I10 Essential (primary) hypertension: Secondary | ICD-10-CM | POA: Diagnosis present

## 2023-01-29 DIAGNOSIS — J9601 Acute respiratory failure with hypoxia: Secondary | ICD-10-CM | POA: Diagnosis present

## 2023-01-29 DIAGNOSIS — L0292 Furuncle, unspecified: Secondary | ICD-10-CM | POA: Diagnosis present

## 2023-01-29 DIAGNOSIS — R0602 Shortness of breath: Secondary | ICD-10-CM | POA: Diagnosis not present

## 2023-01-29 DIAGNOSIS — N179 Acute kidney failure, unspecified: Secondary | ICD-10-CM | POA: Diagnosis present

## 2023-01-29 DIAGNOSIS — M1990 Unspecified osteoarthritis, unspecified site: Secondary | ICD-10-CM | POA: Diagnosis present

## 2023-01-29 DIAGNOSIS — G4730 Sleep apnea, unspecified: Secondary | ICD-10-CM | POA: Diagnosis present

## 2023-01-29 DIAGNOSIS — E669 Obesity, unspecified: Secondary | ICD-10-CM | POA: Diagnosis present

## 2023-01-29 DIAGNOSIS — Z6838 Body mass index (BMI) 38.0-38.9, adult: Secondary | ICD-10-CM

## 2023-01-29 DIAGNOSIS — Z8711 Personal history of peptic ulcer disease: Secondary | ICD-10-CM

## 2023-01-29 DIAGNOSIS — K219 Gastro-esophageal reflux disease without esophagitis: Secondary | ICD-10-CM | POA: Diagnosis present

## 2023-01-29 DIAGNOSIS — E785 Hyperlipidemia, unspecified: Secondary | ICD-10-CM | POA: Diagnosis present

## 2023-01-29 DIAGNOSIS — G4700 Insomnia, unspecified: Secondary | ICD-10-CM | POA: Diagnosis present

## 2023-01-29 DIAGNOSIS — E1122 Type 2 diabetes mellitus with diabetic chronic kidney disease: Principal | ICD-10-CM | POA: Diagnosis present

## 2023-01-29 DIAGNOSIS — I5021 Acute systolic (congestive) heart failure: Secondary | ICD-10-CM | POA: Diagnosis not present

## 2023-01-29 DIAGNOSIS — G40909 Epilepsy, unspecified, not intractable, without status epilepticus: Secondary | ICD-10-CM | POA: Diagnosis present

## 2023-01-29 DIAGNOSIS — R569 Unspecified convulsions: Secondary | ICD-10-CM

## 2023-01-29 LAB — CBC
HCT: 47.1 % — ABNORMAL HIGH (ref 36.0–46.0)
HCT: 46 % (ref 36.0–46.0)
Hemoglobin: 15.1 g/dL — ABNORMAL HIGH (ref 12.0–15.0)
Hemoglobin: 15.2 g/dL — ABNORMAL HIGH (ref 12.0–15.0)
MCH: 34.2 pg — ABNORMAL HIGH (ref 26.0–34.0)
MCH: 34.8 pg — ABNORMAL HIGH (ref 26.0–34.0)
MCHC: 32.1 g/dL (ref 30.0–36.0)
MCHC: 33 g/dL (ref 30.0–36.0)
MCV: 106.6 fL — ABNORMAL HIGH (ref 80.0–100.0)
MCV: 105.3 fL — ABNORMAL HIGH (ref 80.0–100.0)
Platelets: 235 10*3/uL (ref 150–400)
Platelets: 236 10*3/uL (ref 150–400)
RBC: 4.42 MIL/uL (ref 3.87–5.11)
RBC: 4.37 MIL/uL (ref 3.87–5.11)
RDW: 13.7 % (ref 11.5–15.5)
RDW: 13.6 % (ref 11.5–15.5)
WBC: 6.9 10*3/uL (ref 4.0–10.5)
WBC: 7.3 10*3/uL (ref 4.0–10.5)
nRBC: 0 % (ref 0.0–0.2)
nRBC: 0 % (ref 0.0–0.2)

## 2023-01-29 LAB — CREATININE, SERUM
Creatinine, Ser: 1.18 mg/dL — ABNORMAL HIGH (ref 0.44–1.00)
GFR, Estimated: 52 mL/min — ABNORMAL LOW (ref >60)

## 2023-01-29 LAB — COMPREHENSIVE METABOLIC PANEL
ALT: 14 U/L (ref 0–44)
AST: 20 U/L (ref 15–41)
Albumin: 4.1 g/dL (ref 3.5–5.0)
Alkaline Phosphatase: 62 U/L (ref 38–126)
Anion gap: 13 (ref 5–15)
BUN: 24 mg/dL — ABNORMAL HIGH (ref 8–23)
CO2: 34 mmol/L — ABNORMAL HIGH (ref 22–32)
Calcium: 9.1 mg/dL (ref 8.9–10.3)
Chloride: 89 mmol/L — ABNORMAL LOW (ref 98–111)
Creatinine, Ser: 1.26 mg/dL — ABNORMAL HIGH (ref 0.44–1.00)
GFR, Estimated: 48 mL/min — ABNORMAL LOW (ref >60)
Glucose, Bld: 180 mg/dL — ABNORMAL HIGH (ref 70–99)
Potassium: 3.6 mmol/L (ref 3.5–5.1)
Sodium: 136 mmol/L (ref 135–145)
Total Bilirubin: 0.5 mg/dL (ref 0.3–1.2)
Total Protein: 8 g/dL (ref 6.5–8.1)

## 2023-01-29 LAB — BRAIN NATRIURETIC PEPTIDE: B Natriuretic Peptide: 4.6 pg/mL (ref 0.0–100.0)

## 2023-01-29 LAB — GLUCOSE, CAPILLARY: Glucose-Capillary: 151 mg/dL — ABNORMAL HIGH (ref 70–99)

## 2023-01-29 LAB — TROPONIN I (HIGH SENSITIVITY): Troponin I (High Sensitivity): 5 ng/L (ref <18)

## 2023-01-29 MED ORDER — ACETAMINOPHEN 650 MG RE SUPP: 650.0000 mg | Freq: Four times a day (QID) | RECTAL | Status: DC | PRN

## 2023-01-29 MED ORDER — INSULIN ASPART 100 UNIT/ML IJ SOLN
0.0000 [IU] | Freq: Three times a day (TID) | INTRAMUSCULAR | Status: DC
  Administered 2023-01-30: 2 [IU] via SUBCUTANEOUS
  Administered 2023-01-30 – 2023-01-31 (×4): 3 [IU] via SUBCUTANEOUS
  Administered 2023-01-31 – 2023-02-01 (×2): 2 [IU] via SUBCUTANEOUS
  Administered 2023-02-01 (×2): 3 [IU] via SUBCUTANEOUS
  Administered 2023-02-02: 5 [IU] via SUBCUTANEOUS
  Administered 2023-02-02: 2 [IU] via SUBCUTANEOUS
  Administered 2023-02-03: 3 [IU] via SUBCUTANEOUS
  Filled 2023-01-29 (×12): qty 1

## 2023-01-29 MED ORDER — SODIUM CHLORIDE 0.9% FLUSH
3.0000 mL | Freq: Two times a day (BID) | INTRAVENOUS | Status: DC
  Administered 2023-01-29 – 2023-02-03 (×9): 3 mL via INTRAVENOUS

## 2023-01-29 MED ORDER — ACETAMINOPHEN 325 MG PO TABS
650.0000 mg | ORAL_TABLET | Freq: Four times a day (QID) | ORAL | Status: DC | PRN
  Administered 2023-01-30 – 2023-01-31 (×4): 650 mg via ORAL
  Filled 2023-01-29 (×4): qty 2

## 2023-01-29 MED ORDER — FUROSEMIDE 10 MG/ML IJ SOLN: 40.0000 mg | Freq: Once | INTRAMUSCULAR | Status: DC

## 2023-01-29 MED ORDER — HEPARIN SODIUM (PORCINE) 5000 UNIT/ML IJ SOLN
5000.0000 [IU] | Freq: Three times a day (TID) | INTRAMUSCULAR | Status: DC
  Administered 2023-01-29 – 2023-02-03 (×14): 5000 [IU] via SUBCUTANEOUS
  Filled 2023-01-29 (×14): qty 1

## 2023-01-29 MED ORDER — HYDRALAZINE HCL 20 MG/ML IJ SOLN: 5.0000 mg | INTRAMUSCULAR | Status: DC | PRN

## 2023-01-29 MED ORDER — NICOTINE 14 MG/24HR TD PT24
14.0000 mg | MEDICATED_PATCH | Freq: Every day | TRANSDERMAL | Status: DC
  Administered 2023-01-29 – 2023-02-03 (×6): 14 mg via TRANSDERMAL
  Filled 2023-01-29 (×6): qty 1

## 2023-01-29 MED ORDER — FUROSEMIDE 10 MG/ML IJ SOLN
20.0000 mg | Freq: Two times a day (BID) | INTRAMUSCULAR | Status: DC
  Administered 2023-01-29 – 2023-01-30 (×2): 20 mg via INTRAVENOUS
  Filled 2023-01-29 (×2): qty 4

## 2023-01-29 NOTE — Hospital Course (Addendum)
Pt states her swelling and sob  abd distention 3 weeks . Pt uses 6 pillow to sleep.  Oxygen at 2.5 L at home.

## 2023-01-29 NOTE — ED Provider Notes (Signed)
[facility] Provider Note    Event Date/Time   First MD Initiated Contact with Patient 01/29/23 1828     (approximate)   History   Fluid Retention   HPI  Jaime Fitzgerald is a 61 y.o. female past medical history of HFrEF, hypertension, asthma COPD who presents with abdominal swelling and shortness of breath.  Patient tells me that for several weeks she has felt progressively swollen in the abdomen and lower extremities.  She feels short of breath with exertion with laying flat.  Also feels like her legs are swollen.  She does endorse some intermittent twinging type chest pain on the left side of his chest.  Patient follows with CHF clinic.  She takes Lasix and was recently started on metolazone.  Says that since starting the metolazone she has had significant cramping throughout her body.  Does not feel like she is making as much urine as before.     Past Medical History:  Diagnosis Date   Acute blood loss anemia 10/11/2019   Acute GI bleeding 10/11/2019   Anginal pain (HCC)    Arthritis    Asthma    CHF (congestive heart failure) (HCC)    Diabetes mellitus without complication (HCC)    Dyspnea    Hypertension    Seizures (HCC)    Sleep apnea     Patient Active Problem List   Diagnosis Date Noted   SOB (shortness of breath) 10/13/2021   Acute respiratory failure with hypoxia (HCC) 10/13/2021   Infectious colitis 09/18/2020   History of gastric ulcer with hemorrhage 09/17/2020   Hematemesis 09/17/2020   Enteropathogenic Escherichia coli infection 09/17/2020   Campylobacter intestinal infection 09/17/2020   Melena 09/17/2020   Acute infective gastroenteritis 09/17/2020   Acute colitis 09/17/2020   Screening for colon cancer    Other synovitis and tenosynovitis, unspecified hand 10/31/2019   Diverticulosis 10/31/2019   Migraine headache 10/31/2019   Ovarian cyst 10/31/2019   Pain in thoracic spine 10/31/2019   Seizures (HCC) 10/31/2019   Acute  GI bleeding 10/11/2019   Hypertension 10/11/2019   AKI (acute kidney injury) (HCC) 10/11/2019   Syncope and collapse 10/11/2019   Type 2 diabetes mellitus (HCC) 10/11/2019   Acute blood loss anemia 10/11/2019   Acute gastric ulcer with hemorrhage      Physical Exam  Triage Vital Signs: ED Triage Vitals  Enc Vitals Group     BP 01/29/23 1737 (!) 137/91     Pulse Rate 01/29/23 1737 92     Resp 01/29/23 1737 17     Temp 01/29/23 1737 98.1 F (36.7 C)     Temp Source 01/29/23 1737 Oral     SpO2 01/29/23 1737 92 %     Weight 01/29/23 1738 217 lb (98.4 kg)     Height --      Head Circumference --      Peak Flow --      Pain Score 01/29/23 1738 9     Pain Loc --      Pain Edu? --      Excl. in GC? --     Most recent vital signs: Vitals:   01/29/23 2006 01/29/23 2008  BP:    Pulse: 85 86  Resp: (!) 24 (!) 24  Temp:    SpO2: (!) 88% 92%     General: Awake, no distress.  CV:  Good peripheral perfusion.  Resp:  Normal effort.  Decreased breath sounds at the bases  no increased work of breathing Abd:  No distention.  Abdomen is distended but nontender Neuro:             Awake, Alert, Oriented x 3  Other:  No pitting edema in the lower extremities   ED Results / Procedures / Treatments  Labs (all labs ordered are listed, but only abnormal results are displayed) Labs Reviewed  CBC - Abnormal; Notable for the following components:      Result Value   Hemoglobin 15.1 (*)    HCT 47.1 (*)    MCV 106.6 (*)    MCH 34.2 (*)    All other components within normal limits  COMPREHENSIVE METABOLIC PANEL - Abnormal; Notable for the following components:   Chloride 89 (*)    CO2 34 (*)    Glucose, Bld 180 (*)    BUN 24 (*)    Creatinine, Ser 1.26 (*)    GFR, Estimated 48 (*)    All other components within normal limits  BRAIN NATRIURETIC PEPTIDE  TROPONIN I (HIGH SENSITIVITY)     EKG  EKG interpretation performed by myself: NSR, nml axis, nml intervals, no acute  ischemic changes    RADIOLOGY  Chest x-ray reviewed interpreted myself shows cardiomegaly and pulmonary edema  PROCEDURES:  Critical Care performed: No  Procedures  The patient is on the cardiac monitor to evaluate for evidence of arrhythmia and/or significant heart rate changes.   MEDICATIONS ORDERED IN ED: Medications  furosemide (LASIX) injection 40 mg (has no administration in time range)     IMPRESSION / MDM / ASSESSMENT AND PLAN / ED COURSE  I reviewed the triage vital signs and the nursing notes.                              Patient's presentation is most consistent with acute complicated illness / injury requiring diagnostic workup.  Differential diagnosis includes, but is not limited to, renal failure, worsening CHF, ascites, liver disease Patient is a 62 year old female with diastolic CHF presents with increasing abdominal and lower extremity swelling as well as dyspnea on exertion.  Patient's respiratory rate is elevated she is satting around 92%.  When I am in the room she is satting 89% so placed on 2 L.  She does have some abdominal distention not much pitting edema some decreased breath sounds at the bases.  Labs overall reassuring BNP is negative.  Her renal function is near baseline.  Chest x-ray does show cardiomegaly pulmonary vascular congestion and pulmonary edema.  Patient's troponin was negative and EKG is nonischemic.  Given her symptoms and her hypoxia I do think she will need admission for diuresis.  Will give IV Lasix.        FINAL CLINICAL IMPRESSION(S) / ED DIAGNOSES   Final diagnoses:  Hypervolemia, unspecified hypervolemia type     Rx / DC Orders   ED Discharge Orders     None        Note:  This document was prepared using Dragon voice recognition software and may include unintentional dictation errors.   Georga Hacking, MD 01/29/23 [phone]

## 2023-01-29 NOTE — ED Notes (Signed)
Pt changing into gown

## 2023-01-29 NOTE — ED Triage Notes (Signed)
Pt sts that over the last several weeks she has been having more fluid retention than normal. Pt sts that her abd has gotten very large and that her legs are swollen all the time now.

## 2023-01-29 NOTE — H&P (Signed)
History and Physical     Patient: Jaime Fitzgerald WUJ:[id number] DOB: 1961-04-07 DOA: 01/29/2023 DOS: the patient was seen and examined on 01/29/2023 [facility]: [facility]   Patient coming from: Home  Chief Complaint: SOB.  HISTORY OF PRESENT ILLNESS: Jaime Fitzgerald is an 62 y.o. female seen in ed for SOB. Sob x 3 week/ 6 pillow orthopnea. Abd distention and le edema.  Pt does not reports any chest pain palpitation . Patient herself gives history.  States that she has been trying to manage her symptoms at home. States that she has tingling in both her hands that the gabapentin helped but her primary care has taken off of her gabapentin.  She feels her appetite is less. Patient was recently started on metolazone and since then has noted some cramps in her extremities. Patient has past medical history of congestive heart failure, hypertension, previous history of GI bleed and acute blood loss anemia, COPD asthma tobacco abuse ongoing, diabetes mellitus type 2. Patient's cardiologist Dr.Arida.  Past Medical History:  Diagnosis Date   Acute blood loss anemia 10/11/2019   Acute GI bleeding 10/11/2019   Anginal pain (HCC)    Arthritis    Asthma    CHF (congestive heart failure) (HCC)    Diabetes mellitus without complication (HCC)    Dyspnea    Hypertension    Seizures (HCC)    Sleep apnea    Review of Systems  Respiratory:  Positive for shortness of breath. Negative for chest tightness.   Cardiovascular:  Positive for leg swelling.  All other systems reviewed and are negative.  Allergies  Allergen Reactions   Aspirin Nausea Only and Other (See Comments)    GI upset   Penicillins Rash   Past Surgical History:  Procedure Laterality Date   CARPAL TUNNEL RELEASE     COLONOSCOPY WITH PROPOFOL N/A 01/17/2020   Procedure: COLONOSCOPY WITH PROPOFOL;  Surgeon: Toney Reil, MD;  Location: [facility] ENDOSCOPY;  Service: Gastroenterology;  Laterality: N/A;    ESOPHAGOGASTRODUODENOSCOPY N/A 10/13/2019   Procedure: ESOPHAGOGASTRODUODENOSCOPY (EGD);  Surgeon: Pasty Spillers, MD;  Location: [facility] ENDOSCOPY;  Service: Endoscopy;  Laterality: N/A;   ESOPHAGOGASTRODUODENOSCOPY N/A 09/18/2020   Procedure: ESOPHAGOGASTRODUODENOSCOPY (EGD);  Surgeon: Toledo, Boykin Nearing, MD;  Location: [facility] ENDOSCOPY;  Service: Gastroenterology;  Laterality: N/A;   ESOPHAGOGASTRODUODENOSCOPY (EGD) WITH PROPOFOL N/A 10/11/2019   Procedure: ESOPHAGOGASTRODUODENOSCOPY (EGD) WITH PROPOFOL;  Surgeon: Pasty Spillers, MD;  Location: [facility] ENDOSCOPY;  Service: Endoscopy;  Laterality: N/A;   ESOPHAGOGASTRODUODENOSCOPY (EGD) WITH PROPOFOL N/A 01/17/2020   Procedure: ESOPHAGOGASTRODUODENOSCOPY (EGD) WITH PROPOFOL;  Surgeon: Toney Reil, MD;  Location: [facility] ENDOSCOPY;  Service: Gastroenterology;  Laterality: N/A;   TONSILLECTOMY     MEDICATIONS: Prior to Admission medications   Medication Sig Start Date End Date Taking? Authorizing Provider  albuterol (VENTOLIN HFA) 108 (90 Base) MCG/ACT inhaler Inhale 2 puffs into the lungs every 6 (six) hours as needed for wheezing or shortness of breath. 10/14/19   Lynn Ito, MD  amLODipine (NORVASC) 5 MG tablet TAKE ONE TABLET BY MOUTH DAILY 09/23/22   Iran Ouch, MD  cyclobenzaprine (FLEXERIL) 10 MG tablet Take 10 mg by mouth as needed for muscle spasms.    [provider]  Fluticasone Propionate, Inhal, (FLOVENT IN) Inhale into the lungs as needed.    [provider]  furosemide (LASIX) 40 MG tablet TAKE ONE TABLET BY MOUTH DAILY 01/21/23   Furth, Cadence H, PA-C  glipiZIDE (GLUCOTROL XL) 10 MG  24 hr tablet Take 10 mg by mouth 2 (two) times daily. 10/27/21   [provider]  meclizine (ANTIVERT) 12.5 MG tablet Take 1 tablet (12.5 mg total) by mouth 3 (three) times daily as needed for up to 30 doses for dizziness. Patient not taking: Reported on 01/12/2023 08/31/22   Fransico Michael, Cadence H, PA-C  metolazone  (ZAROXOLYN) 2.5 MG tablet Take one tablet on Wednesday, Friday and Sunday. 01/12/23   Delma Freeze, FNP  metoprolol succinate (TOPROL-XL) 25 MG 24 hr tablet TAKE ONE TABLET BY MOUTH EVERY DAY 01/21/23   Furth, Cadence H, PA-C  metoprolol tartrate (LOPRESSOR) 100 MG tablet Take 1 tablet (100 mg) by mouth 2 hours prior to your Cardiac CT 07/23/22   Furth, Cadence H, PA-C  pantoprazole (PROTONIX) 40 MG tablet Take 1 tablet (40 mg total) by mouth daily. 08/31/22   Furth, Cadence H, PA-C  potassium chloride SA (KLOR-CON M) 20 MEQ tablet Take 60 mEq (3 tablets) on Wednesday, Friday and Sunday with Metolazone. 01/12/23   Delma Freeze, FNP  pravastatin (PRAVACHOL) 40 MG tablet TAKE ONE TABLET BY MOUTH EVERY DAY 11/07/22   Delma Freeze, FNP  vitamin B-12 (CYANOCOBALAMIN) 1000 MCG tablet Take 1 tablet (1,000 mcg total) by mouth daily. 08/05/21   Toney Reil, MD  witch hazel-glycerin (TUCKS) pad Apply topically as needed for itching. Patient not taking: Reported on 01/12/2023 10/17/21   Lolita Patella B, MD    furosemide  20 mg Intravenous BID   heparin  5,000 Units Subcutaneous Q8H   [START ON 01/30/2023] insulin aspart  0-15 Units Subcutaneous TID WC   nicotine  14 mg Transdermal Daily   sodium chloride flush  3 mL Intravenous Q12H   ED Course: Pt in Ed patient is alert awake obese afebrile O2 sats of 92% on 2 L nasal cannula. Patient uses home oxygen as needed at 2.5 L.  Vitals:   01/29/23 1738 01/29/23 2006 01/29/23 2008 01/29/23 2200  BP:    (!) 142/88  Pulse:  85 86 85  Resp:  (!) 24 (!) 24 19  Temp:      TempSrc:      SpO2:  (!) 88% 92% 100%  Weight: 98.4 kg      No intake/output data recorded. SpO2: 100 % O2 Flow Rate (L/min): 2 L/min Blood work in ed shows: Glucose of 180 creatinine of 1.26, normal LFTs. BNP of 4.6 troponin of 5. CBC shows a normal white count of 6.9 hemoglobin of 15.1 platelet count of 235. Most recent echocardiogram in February 2023 shows: Referring  Phys: [id number] MUHAMMAD A ARIDA  IMPRESSIONS   1. Left ventricular ejection fraction, by estimation, is 60 to 65%. Left  ventricular ejection fraction by 2D MOD biplane is 65.1 %. The left  ventricle has normal function. The left ventricle has no regional wall  motion abnormalities. There is mild left  ventricular hypertrophy.   2. Right ventricular systolic function is normal.   3. There is no evidence of cardiac tamponade.   4. The mitral valve is normal in structure. Trivial mitral valve  regurgitation.   5. Aortic valve regurgitation is not visualized.   6. The inferior vena cava is normal in size with greater than 50%  respiratory variability, suggesting right atrial pressure of 3 mmHg.   Limited echo 11/2021  1. Left ventricular ejection fraction, by estimation, is 60 to 65%. Left  ventricular ejection fraction by 2D MOD biplane is 65.1 %. The left  ventricle has normal function. The left ventricle has no regional wall  motion abnormalities. There is mild left  ventricular hypertrophy.   2. Right ventricular systolic function is normal.   3. There is no evidence of cardiac tamponade.   4. The mitral valve is normal in structure. Trivial mitral valve  regurgitation.   5. Aortic valve regurgitation is not visualized.   6. The inferior vena cava is normal in size with greater than 50%  respiratory variability, suggesting right atrial pressure of 3 mmHg.   In the emergency room patient received Lasix 40 mg.  Chest xray shows: IMPRESSION: Cardiomegaly. Central pulmonary vessels are prominent suggesting CHF. Increased interstitial markings are seen in parahilar regions and lower lung fields suggesting interstitial pulmonary edema. Possible small left pleural effusion.  Results for orders placed or performed during the hospital encounter of 01/29/23 (from the past 72 hour(s))  CBC     Status: Abnormal   Collection Time: 01/29/23  5:41 PM  Result Value Ref Range   WBC 6.9 4.0 -  10.5 K/uL   RBC 4.42 3.87 - 5.11 MIL/uL   Hemoglobin 15.1 (H) 12.0 - 15.0 g/dL   HCT 16.1 (H) 09.6 - 04.5 %   MCV 106.6 (H) 80.0 - 100.0 fL   MCH 34.2 (H) 26.0 - 34.0 pg   MCHC 32.1 30.0 - 36.0 g/dL   RDW 40.9 81.1 - 91.4 %   Platelets 235 150 - 400 K/uL   nRBC 0.0 0.0 - 0.2 %    Comment: Performed at [facility], [address] [place], [place] [address]  Comprehensive metabolic panel     Status: Abnormal   Collection Time: 01/29/23  5:41 PM  Result Value Ref Range   Sodium 136 135 - 145 mmol/L   Potassium 3.6 3.5 - 5.1 mmol/L   Chloride 89 (L) 98 - 111 mmol/L   CO2 34 (H) 22 - 32 mmol/L   Glucose, Bld 180 (H) 70 - 99 mg/dL    Comment: Glucose reference range applies only to samples taken after fasting for at least 8 hours.   BUN 24 (H) 8 - 23 mg/dL   Creatinine, Ser 6.21 (H) 0.44 - 1.00 mg/dL   Calcium 9.1 8.9 - 30.8 mg/dL   Total Protein 8.0 6.5 - 8.1 g/dL   Albumin 4.1 3.5 - 5.0 g/dL   AST 20 15 - 41 U/L   ALT 14 0 - 44 U/L   Alkaline Phosphatase 62 38 - 126 U/L   Total Bilirubin 0.5 0.3 - 1.2 mg/dL   GFR, Estimated 48 (L) >60 mL/min    Comment: (NOTE) Calculated using the CKD-EPI Creatinine Equation (2021)    Anion gap 13 5 - 15    Comment: Performed at [facility], [address] [place], [place] [address]  Brain natriuretic peptide     Status: None   Collection Time: 01/29/23  5:41 PM  Result Value Ref Range   B Natriuretic Peptide 4.6 0.0 - 100.0 pg/mL    Comment: Performed at [facility], [address] [place], [place] [address]  Troponin I (High Sensitivity)     Status: None   Collection Time: 01/29/23  5:41 PM  Result Value Ref Range   Troponin I (High Sensitivity) 5 <18 ng/L    Comment: (NOTE) Elevated high sensitivity troponin I (hsTnI) values and significant  changes across serial measurements may suggest ACS but many other  chronic and acute conditions are known to elevate hsTnI  results.  Refer to the "Links" section  for chest pain algorithms and additional  guidance. Performed at [facility], [address] [place], [place] [address]     Lab Results  Component Value Date   CREATININE 1.26 (H) 01/29/2023   CREATININE 1.18 (H) 01/12/2023   CREATININE 1.17 (H) 08/31/2022      Latest Ref Rng & Units 01/29/2023    5:41 PM 01/12/2023    3:14 PM 08/31/2022    3:16 PM  CMP  Glucose 70 - 99 mg/dL 604  540  981   BUN 8 - 23 mg/dL 24  17  21    Creatinine 0.44 - 1.00 mg/dL 1.91  4.78  2.95   Sodium 135 - 145 mmol/L 136  137  142   Potassium 3.5 - 5.1 mmol/L 3.6  3.5  4.1   Chloride 98 - 111 mmol/L 89  98  102   CO2 22 - 32 mmol/L 34  30  31   Calcium 8.9 - 10.3 mg/dL 9.1  8.8  9.4   Total Protein 6.5 - 8.1 g/dL 8.0     Total Bilirubin 0.3 - 1.2 mg/dL 0.5     Alkaline Phos 38 - 126 U/L 62     AST 15 - 41 U/L 20     ALT 0 - 44 U/L 14      Unresulted Labs (From admission, onward)     Start     Ordered   01/29/23 2119  CBC  (heparin)  Once,   STAT       Comments: Baseline for heparin therapy IF NOT ALREADY DRAWN.  Notify MD if PLT < 100 K.    01/29/23 2119   01/29/23 2119  Creatinine, serum  (heparin)  Once,   STAT       Comments: Baseline for heparin therapy IF NOT ALREADY DRAWN.    01/29/23 2119   01/29/23 2117  Hemoglobin A1c  Once,   URGENT       Comments: To assess prior glycemic control    01/29/23 2119   01/29/23 2117  HIV Antibody (routine testing w rflx)  (HIV Antibody (Routine testing w reflex) panel)  Once,   URGENT        01/29/23 2119           Pt has received : Orders Placed This Encounter  Procedures   DG Chest 2 View    Standing Status:   Standing    Number of Occurrences:   1    Order Specific Question:   Reason for Exam (SYMPTOM  OR DIAGNOSIS REQUIRED)    Answer:   fluid overload   CBC    Standing Status:   Standing    Number of Occurrences:   1   Comprehensive metabolic panel    Standing Status:   Standing    Number of Occurrences:   1   Brain  natriuretic peptide    Standing Status:   Standing    Number of Occurrences:   1   Hemoglobin A1c    To assess prior glycemic control    Standing Status:   Standing    Number of Occurrences:   1   HIV Antibody (routine testing w rflx)    Standing Status:   Standing    Number of Occurrences:   1   CBC    Baseline for heparin therapy IF NOT ALREADY DRAWN.  Notify MD if PLT < 100 K.  Standing Status:   Standing    Number of Occurrences:   1   Creatinine, serum    Baseline for heparin therapy IF NOT ALREADY DRAWN.    Standing Status:   Standing    Number of Occurrences:   1   Diet heart healthy/carb modified Room service appropriate? Yes; Fluid consistency: Thin; Fluid restriction: 1500 mL Fluid    2 gram sodium restriction    Standing Status:   Standing    Number of Occurrences:   1    Order Specific Question:   Diet-HS Snack?    Answer:   Nothing    Order Specific Question:   Room service appropriate?    Answer:   Yes    Order Specific Question:   Fluid consistency:    Answer:   Thin    Order Specific Question:   Fluid restriction:    Answer:   1500 mL Fluid   Apply Diabetes Mellitus Care Plan    Standing Status:   Standing    Number of Occurrences:   1   STAT CBG when hypoglycemia is suspected. If treated, recheck every 15 minutes after each treatment until CBG >/= 70 mg/dl    Standing Status:   Standing    Number of Occurrences:   1   Refer to Hypoglycemia Protocol Sidebar Report for treatment of CBG < 70 mg/dl    Standing Status:   Standing    Number of Occurrences:   1   No HS correction Insulin    Standing Status:   Standing    Number of Occurrences:   1   Notify physician (specify)    Standing Status:   Standing    Number of Occurrences:   20    Order Specific Question:   Notify Physician    Answer:   for pulse less than 60 and patient is symptomatic    Order Specific Question:   Notify Physician    Answer:   urine output less than 250 ml 4 hours after first  diuretic dose OR less than 1000 ml 24 hours after admission    Order Specific Question:   Notify Physician    Answer:   SBP less than 85    Order Specific Question:   Notify Physician    Answer:   for Glycemic Control (SSI) Order Set if diabetic or glucose >140 mg/dl    Order Specific Question:   Notify Physician    Answer:   for O2 requirements exceeding 6 L/min   Initiate Heart Failure Care Plan    Standing Status:   Standing    Number of Occurrences:   1   Daily weights    Teach patient/caregiver to weigh daily and record on weight chart    Standing Status:   Standing    Number of Occurrences:   1   Strict intake and output    Standing Status:   Standing    Number of Occurrences:   1   In and Out Cath    If patient is unable to collect urine after other methods have failed (i.e., frequent toileting, condom caths), may perform In and Out cath. Follow  Urinary Catheter Guidelines    Standing Status:   Standing    Number of Occurrences:   20   Patient education (specify):    Review HF Booklet and HF video with patient and caregivers, and document completion. If patient already has HF booklet, give HF Care Note and  weight chart only.    Standing Status:   Standing    Number of Occurrences:   1   Apply Heart Failure Care Plan    Standing Status:   Standing    Number of Occurrences:   1   Cardiac Monitoring Continuous x 24 hours Indications for use: Sub-acute heart failure    Standing Status:   Standing    Number of Occurrences:   1    Order Specific Question:   Indications for use:    Answer:   Sub-acute heart failure   Maintain IV access    Standing Status:   Standing    Number of Occurrences:   1   Vital signs    Standing Status:   Standing    Number of Occurrences:   1   Notify physician (specify)    Standing Status:   Standing    Number of Occurrences:   20    Order Specific Question:   Notify Physician    Answer:   for pulse less than 55 or greater than 120     Order Specific Question:   Notify Physician    Answer:   for respiratory rate less than 12 or greater than 25    Order Specific Question:   Notify Physician    Answer:   for temperature greater than 100.5 F    Order Specific Question:   Notify Physician    Answer:   for urinary output less than 30 mL/hr for four hours    Order Specific Question:   Notify Physician    Answer:   for systolic BP less than 90 or greater than 160, diastolic BP less than 60 or greater than 100    Order Specific Question:   Notify Physician    Answer:   for new hypoxia w/ oxygen saturations < 88%   Progressive Mobility Protocol: No Restrictions    Standing Status:   Standing    Number of Occurrences:   1   If patient diabetic or glucose greater than 140 notify physician for Sliding Scale Insulin Orders    Standing Status:   Standing    Number of Occurrences:   20   Do not place and if present remove PureWick    Standing Status:   Standing    Number of Occurrences:   1   Initiate Oral Care Protocol    Standing Status:   Standing    Number of Occurrences:   1   Initiate Carrier Fluid Protocol    Standing Status:   Standing    Number of Occurrences:   1   No meal coverage at this time.    Standing Status:   Standing    Number of Occurrences:   1   Full code    Standing Status:   Standing    Number of Occurrences:   1    Order Specific Question:   By:    Answer:   Other   Consult to hospitalist    Standing Status:   Standing    Number of Occurrences:   1    Order Specific Question:   Place call to:    Answer:   [phone]    Order Specific Question:   Reason for Consult    Answer:   Admit    Order Specific Question:   Diagnosis/Clinical Info for Consult:    Answer:   chf   Consult to Transition of Care Team  Heart failure home health screen and may place order for PT / OT eval and treat if indicated    Standing Status:   Standing    Number of Occurrences:   1    Order Specific Question:   Reason  for Consult:    Answer:   SNF placement    Order Specific Question:   Reason for Consult:    Answer:   Other (see comments)   Nutritional services consult    Standing Status:   Standing    Number of Occurrences:   1    Order Specific Question:   Reason for Consult?    Answer:   nutritional goals   OT eval and treat    Standing Status:   Standing    Number of Occurrences:   1   PT eval and treat    Standing Status:   Standing    Number of Occurrences:   1   Pulse oximetry check with vital signs    Standing Status:   Standing    Number of Occurrences:   1   Oxygen therapy Mode or (Route): Nasal cannula; Liters Per Minute: 2; Keep 02 saturation: greater than 92 %    Standing Status:   Standing    Number of Occurrences:   1    Order Specific Question:   Mode or (Route)    Answer:   Nasal cannula    Order Specific Question:   Liters Per Minute    Answer:   2    Order Specific Question:   Keep 02 saturation    Answer:   greater than 92 %   Incentive spirometry    Standing Status:   Standing    Number of Occurrences:   1   ED EKG    Standing Status:   Standing    Number of Occurrences:   1    Order Specific Question:   Reason for Exam    Answer:   Chest Pain   ECHOCARDIOGRAM COMPLETE    Standing Status:   Standing    Number of Occurrences:   1    Order Specific Question:   Perflutren DEFINITY (image enhancing agent) should be administered unless hypersensitivity or allergy exist    Answer:   Administer Perflutren    Order Specific Question:   Reason for exam-Echo    Answer:   CHF-Acute Systolic  428.21 / I50.21   Insert peripheral IV    Standing Status:   Standing    Number of Occurrences:   1   Admit to Inpatient (patient's expected length of stay will be greater than 2 midnights or inpatient only procedure)    Standing Status:   Standing    Number of Occurrences:   1    Order Specific Question:   Hospital Area    Answer:   [facility] [[id number]]     Order Specific Question:   Level of Care    Answer:   Telemetry Medical [104]    Order Specific Question:   Covid Evaluation    Answer:   Asymptomatic - no recent exposure (last 10 days) testing not required    Order Specific Question:   Diagnosis    Answer:   SOB (shortness of breath) [161096]    Order Specific Question:   Admitting Physician    Answer:   Darrold Junker    Order Specific Question:   Attending Physician    Answer:  Sreeja Spies V [id number]    Order Specific Question:   Certification:    Answer:   I certify this patient will need inpatient services for at least 2 midnights    Order Specific Question:   Estimated Length of Stay    Answer:   1    Meds ordered this encounter  Medications   DISCONTD: furosemide (LASIX) injection 40 mg   insulin aspart (novoLOG) injection 0-15 Units    Order Specific Question:   Correction coverage:    Answer:   Moderate (average weight, post-op)    Order Specific Question:   CBG < 70:    Answer:   implement hypoglycemia protocol    Order Specific Question:   CBG 70 - 120:    Answer:   0 units    Order Specific Question:   CBG 121 - 150:    Answer:   2 units    Order Specific Question:   CBG 151 - 200:    Answer:   3 units    Order Specific Question:   CBG 201 - 250:    Answer:   5 units    Order Specific Question:   CBG 251 - 300:    Answer:   8 units    Order Specific Question:   CBG 301 - 350:    Answer:   11 units    Order Specific Question:   CBG 351 - 400:    Answer:   15 units    Order Specific Question:   CBG > 400    Answer:   call MD and obtain STAT lab verification   furosemide (LASIX) injection 20 mg   sodium chloride flush (NS) 0.9 % injection 3 mL   OR Linked Order Group    acetaminophen (TYLENOL) tablet 650 mg    acetaminophen (TYLENOL) suppository 650 mg   hydrALAZINE (APRESOLINE) injection 5 mg   heparin injection 5,000 Units   nicotine (NICODERM CQ - dosed in mg/24 hours) patch 14 mg    Admission  Imaging : DG Chest 2 View  Result Date: 01/29/2023 CLINICAL DATA:  Difficulty breathing, edema in lower extremities EXAM: CHEST - 2 VIEW COMPARISON:  10/13/2021 FINDINGS: Transverse diameter of heart is increased. Central pulmonary vessels are more prominent. Increased interstitial markings are seen in parahilar regions and lower lung fields on both sides. There is blunting of left lateral CP angle. There is no pneumothorax. IMPRESSION: Cardiomegaly. Central pulmonary vessels are prominent suggesting CHF. Increased interstitial markings are seen in parahilar regions and lower lung fields suggesting interstitial pulmonary edema. Possible small left pleural effusion. Electronically Signed   By: Ernie Avena M.D.   On: 01/29/2023 18:02   Physical Examination: Vitals:   01/29/23 1738 01/29/23 2006 01/29/23 2008 01/29/23 2200  BP:    (!) 142/88  Pulse:  85 86 85  Temp:      Resp:  (!) 24 (!) 24 19  Weight: 98.4 kg     SpO2:  (!) 88% 92% 100%  TempSrc:       Physical Exam Vitals and nursing note reviewed.  Constitutional:      General: She is not in acute distress.    Appearance: Normal appearance. She is not ill-appearing, toxic-appearing or diaphoretic.  HENT:     Head: Normocephalic and atraumatic.     Right Ear: Hearing and external ear normal.     Left Ear: Hearing and external ear normal.     Nose: Nose normal. No  nasal deformity.     Mouth/Throat:     Lips: Pink.     Mouth: Mucous membranes are moist.     Tongue: No lesions.     Pharynx: Oropharynx is clear.  Eyes:     Extraocular Movements: Extraocular movements intact.     Pupils: Pupils are equal, round, and reactive to light.  Cardiovascular:     Rate and Rhythm: Normal rate and regular rhythm.     Pulses: Normal pulses.     Heart sounds: Normal heart sounds.  Pulmonary:     Effort: Pulmonary effort is normal. No accessory muscle usage or respiratory distress.     Breath sounds: Examination of the left-middle field  reveals rales. Examination of the left-lower field reveals rales. Rales present.  Abdominal:     General: Bowel sounds are normal. There is no distension.     Palpations: Abdomen is soft. There is no mass.     Tenderness: There is no abdominal tenderness. There is no guarding.     Hernia: No hernia is present.  Musculoskeletal:     Right lower leg: No edema.     Left lower leg: No edema.  Skin:    General: Skin is warm.  Neurological:     General: No focal deficit present.     Mental Status: She is alert and oriented to person, place, and time.     Cranial Nerves: Cranial nerves 2-12 are intact.     Motor: Motor function is intact.  Psychiatric:        Attention and Perception: Attention normal.        Mood and Affect: Mood normal.        Speech: Speech normal.        Behavior: Behavior normal. Behavior is cooperative.        Cognition and Memory: Cognition normal.     Assessment and Plan: >> Shortness of breath/respiratory failure with hypoxia: Attribute secondary to mild congestive heart failure. Will admit to telemetry unit and cautious diuresis. Strict I's and O's. Daily weights.Monitor electrolytes. Will start patient on Lasix 20 mg IV every 12. Will continue patient on 2 L nasal cannula. Chest x-ray shows cardiomegaly pulmonary vascular congestion and is negative. EKG is SR:    >> Tobacco abuse: Nicotine patch, counseling when patient is stable.   >> Diabetes mellitus type 2: Glycemic protocol consistent carb diet/cardiac diet.   >> AKI: Lab Results  Component Value Date   CREATININE 1.26 (H) 01/29/2023   CREATININE 1.18 (H) 01/12/2023   CREATININE 1.17 (H) 08/31/2022     >> Seizures: Seizure precautions, aspiration and fall precautions. No AEDs in chart.   >> Essential hypertension: Vitals:   01/29/23 1737 01/29/23 2200  BP: (!) 137/91 (!) 142/88  Home medications currently held to allow room for diuresis and prevent worsening renal  function.   DVT prophylaxis:  Heparin Code Status:  Full code    10/14/2021    3:33 PM  Advanced Directives  Does Patient Have a Medical Advance Directive? No  Would patient like information on creating a medical advance directive? No - Patient declined    Family Communication:  Husband at bedside Emergency Contact: Contact Information     Name Relation Home Work Mobile   [place] Friend [phone]     Mirza, Kidney Daughter   [phone]   Makyra, Corprew   [phone]   Lilliam, [place]   [phone]       Disposition Plan:  Home Consults: None Admission  status: Observation Unit / Expected LOS: Med/tele/2 days  Gertha Calkin MD [facility]  6 PM- 2 AM. [phone]( Pager )  For questions regarding this patient please use [email or web address] to contact the current [facility] MD.   Bonita Quin may also call [phone] to contact current Assigned [facility] Attending/Consulting MD for this patient.

## 2023-01-30 ENCOUNTER — Inpatient Hospital Stay (HOSPITAL_COMMUNITY)
Admit: 2023-01-30 | Discharge: 2023-01-30 | Disposition: A | Discharge status: Home / Self Care | Payer: Medicaid Other | Attending: Internal Medicine | Admitting: Internal Medicine

## 2023-01-30 ENCOUNTER — Inpatient Hospital Stay: Payer: Medicaid Other

## 2023-01-30 ENCOUNTER — Encounter: Payer: Self-pay | Admitting: Internal Medicine

## 2023-01-30 ENCOUNTER — Other Ambulatory Visit: Payer: Self-pay

## 2023-01-30 DIAGNOSIS — R0602 Shortness of breath: Secondary | ICD-10-CM | POA: Diagnosis not present

## 2023-01-30 DIAGNOSIS — I5021 Acute systolic (congestive) heart failure: Secondary | ICD-10-CM

## 2023-01-30 LAB — BASIC METABOLIC PANEL
Anion gap: 12 (ref 5–15)
BUN: 28 mg/dL — ABNORMAL HIGH (ref 8–23)
CO2: 36 mmol/L — ABNORMAL HIGH (ref 22–32)
Calcium: 8.8 mg/dL — ABNORMAL LOW (ref 8.9–10.3)
Chloride: 89 mmol/L — ABNORMAL LOW (ref 98–111)
Creatinine, Ser: 1.33 mg/dL — ABNORMAL HIGH (ref 0.44–1.00)
GFR, Estimated: 45 mL/min — ABNORMAL LOW (ref >60)
Glucose, Bld: 187 mg/dL — ABNORMAL HIGH (ref 70–99)
Potassium: 3.6 mmol/L (ref 3.5–5.1)
Sodium: 137 mmol/L (ref 135–145)

## 2023-01-30 LAB — IRON AND TIBC
Iron: 66 ug/dL (ref 28–170)
Saturation Ratios: 22 % (ref 10.4–31.8)
TIBC: 301 ug/dL (ref 250–450)
UIBC: 235 ug/dL

## 2023-01-30 LAB — MAGNESIUM: Magnesium: 1.6 mg/dL — ABNORMAL LOW (ref 1.7–2.4)

## 2023-01-30 LAB — ECHOCARDIOGRAM COMPLETE
Area-P 1/2: 2.79 cm2
Height: 63 in
S' Lateral: 2.9 cm
Weight: 3298.08 oz

## 2023-01-30 LAB — CBC
HCT: 45.4 % (ref 36.0–46.0)
Hemoglobin: 14.9 g/dL (ref 12.0–15.0)
MCH: 34.4 pg — ABNORMAL HIGH (ref 26.0–34.0)
MCHC: 32.8 g/dL (ref 30.0–36.0)
MCV: 104.8 fL — ABNORMAL HIGH (ref 80.0–100.0)
Platelets: 223 10*3/uL (ref 150–400)
RBC: 4.33 MIL/uL (ref 3.87–5.11)
RDW: 13.5 % (ref 11.5–15.5)
WBC: 5.6 10*3/uL (ref 4.0–10.5)
nRBC: 0 % (ref 0.0–0.2)

## 2023-01-30 LAB — PHOSPHORUS: Phosphorus: 4.7 mg/dL — ABNORMAL HIGH (ref 2.5–4.6)

## 2023-01-30 LAB — GLUCOSE, CAPILLARY
Glucose-Capillary: 188 mg/dL — ABNORMAL HIGH (ref 70–99)
Glucose-Capillary: 167 mg/dL — ABNORMAL HIGH (ref 70–99)
Glucose-Capillary: 121 mg/dL — ABNORMAL HIGH (ref 70–99)
Glucose-Capillary: 171 mg/dL — ABNORMAL HIGH (ref 70–99)

## 2023-01-30 LAB — HEMOGLOBIN A1C
Hgb A1c MFr Bld: 7.6 % — ABNORMAL HIGH (ref 4.8–5.6)
Mean Plasma Glucose: 171.42 mg/dL

## 2023-01-30 LAB — FOLATE: Folate: 14.6 ng/mL (ref >5.9)

## 2023-01-30 LAB — VITAMIN B12: Vitamin B-12: 582 pg/mL (ref 180–914)

## 2023-01-30 LAB — HIV ANTIBODY (ROUTINE TESTING W REFLEX): HIV Screen 4th Generation wRfx: NONREACTIVE

## 2023-01-30 LAB — VITAMIN D 25 HYDROXY (VIT D DEFICIENCY, FRACTURES): Vit D, 25-Hydroxy: 15.48 ng/mL — ABNORMAL LOW (ref 30–100)

## 2023-01-30 MED ORDER — OXYCODONE HCL 5 MG PO TABS
5.0000 mg | ORAL_TABLET | Freq: Four times a day (QID) | ORAL | Status: DC | PRN
  Administered 2023-01-30 – 2023-02-03 (×5): 5 mg via ORAL
  Filled 2023-01-30 (×5): qty 1

## 2023-01-30 MED ORDER — PANTOPRAZOLE SODIUM 40 MG PO TBEC
40.0000 mg | DELAYED_RELEASE_TABLET | Freq: Two times a day (BID) | ORAL | Status: DC
  Administered 2023-01-30 – 2023-02-03 (×9): 40 mg via ORAL
  Filled 2023-01-30 (×9): qty 1

## 2023-01-30 MED ORDER — MAGNESIUM SULFATE 2 GM/50ML IV SOLN
2.0000 g | Freq: Once | INTRAVENOUS | Status: AC
  Administered 2023-01-30: 2 g via INTRAVENOUS
  Filled 2023-01-30: qty 50

## 2023-01-30 MED ORDER — CALCIUM CARBONATE ANTACID 500 MG PO CHEW
1.0000 | CHEWABLE_TABLET | Freq: Two times a day (BID) | ORAL | Status: AC
  Administered 2023-01-30 – 2023-02-02 (×6): 200 mg via ORAL
  Filled 2023-01-30 (×6): qty 1

## 2023-01-30 MED ORDER — BISACODYL 5 MG PO TBEC
10.0000 mg | DELAYED_RELEASE_TABLET | Freq: Every day | ORAL | Status: DC | PRN
  Administered 2023-01-30 – 2023-02-01 (×2): 10 mg via ORAL
  Filled 2023-01-30 (×2): qty 2

## 2023-01-30 MED ORDER — PERFLUTREN LIPID MICROSPHERE
1.0000 mL | INTRAVENOUS | Status: AC | PRN
  Administered 2023-01-30: 6 mL via INTRAVENOUS

## 2023-01-30 MED ORDER — ALBUTEROL SULFATE (2.5 MG/3ML) 0.083% IN NEBU: 3.0000 mL | INHALATION_SOLUTION | Freq: Four times a day (QID) | RESPIRATORY_TRACT | Status: DC | PRN

## 2023-01-30 MED ORDER — POLYETHYLENE GLYCOL 3350 17 G PO PACK
17.0000 g | PACK | Freq: Two times a day (BID) | ORAL | Status: DC
  Administered 2023-01-30 – 2023-02-03 (×7): 17 g via ORAL
  Filled 2023-01-30 (×7): qty 1

## 2023-01-30 MED ORDER — ADULT MULTIVITAMIN W/MINERALS CH
1.0000 | ORAL_TABLET | Freq: Every day | ORAL | Status: DC
  Administered 2023-01-31 – 2023-02-02 (×3): 1 via ORAL
  Filled 2023-01-30 (×4): qty 1

## 2023-01-30 MED ORDER — BISACODYL 10 MG RE SUPP: 10.0000 mg | Freq: Every day | RECTAL | Status: DC | PRN

## 2023-01-30 MED ORDER — METOPROLOL SUCCINATE ER 25 MG PO TB24
25.0000 mg | ORAL_TABLET | Freq: Every day | ORAL | Status: DC
  Administered 2023-01-31 – 2023-02-03 (×4): 25 mg via ORAL
  Filled 2023-01-30 (×4): qty 1

## 2023-01-30 MED ORDER — BISACODYL 5 MG PO TBEC
10.0000 mg | DELAYED_RELEASE_TABLET | Freq: Once | ORAL | Status: AC
  Administered 2023-01-30: 10 mg via ORAL
  Filled 2023-01-30: qty 2

## 2023-01-30 NOTE — Progress Notes (Signed)
Initial Nutrition Assessment  DOCUMENTATION CODES:   Not applicable  INTERVENTION:  Multivitamin w/ minerals daily Liberalize diet to carb modified due to reports of poor PO intake  Continue 1500 mL fluid restriction Encourage good PO intake  Consider increasing bowel regimen due to complaints of constipation  NUTRITION DIAGNOSIS:   Increased nutrient needs related to chronic illness as evidenced by estimated needs.  GOAL:   Patient will meet greater than or equal to 90% of their needs  MONITOR:   PO intake, Supplement acceptance, Labs, I & O's  REASON FOR ASSESSMENT:   Consult  (Nutritional Goals)  ASSESSMENT:   62 y.o. female presented to the ED  with SOB. PMH includes CHF, T2DM, HTN, COPD, and diverticulosis. Pt admitted with SOB, respiratory failure with hypoxia, and AKI.   RD working remotely at time assessment. Per chart, pt is out of the room. Per H&P, pt reported decreased appetite PTA. One meal intake of 100% documented today.   Per EMR, pt weight has fluctuated between 93.5-96.8 kg over the past year. Noted pt with BLE edema.   Medications reviewed and include: Calcium Carbonate, NovoLog SSI, Protonix, Miralax Labs reviewed: Sodium 137, Potassium 3.6, BUN 28, Creatinine 1.33, Phosphorus 4.7, Magnesium 1.6, Hgb A1c 7.6%, 24 hr CBGs 151-188  UOP: none documented  NUTRITION - FOCUSED PHYSICAL EXAM: Deferred to follow-up.  Diet Order:   Diet Order             Diet Carb Modified Fluid consistency: Thin; Room service appropriate? Yes with Assist; Fluid restriction: 1500 mL Fluid  Diet effective now                   EDUCATION NEEDS:   No education needs have been identified at this time  Skin:  Skin Assessment: Reviewed RN Assessment  Last BM:  4/22  Height:  Ht Readings from Last 1 Encounters:  01/30/23 5\' 3"  (1.6 m)   Weight:  Wt Readings from Last 1 Encounters:  01/30/23 93.5 kg   Ideal Body Weight:  52.3 kg  BMI:  Body mass index  is 36.51 kg/m.  Estimated Nutritional Needs:  Kcal:  1800-2000 Protein:  85-100 grams Fluid:  >/= 1.8 L   Kirby Crigler RD, LDN Clinical Dietitian See [facility] for contact information.

## 2023-01-30 NOTE — Evaluation (Signed)
Occupational Therapy Evaluation Patient Details Name: Jaime Fitzgerald MRN: [id number] DOB: [birth year] Today's Date: 01/30/2023   History of Present Illness Jaime Fitzgerald is a 62 y.o. female past medical history of HFrEF, hypertension, asthma COPD who presents with abdominal swelling and shortness of breath.  Patient tells me that for several weeks she has felt progressively swollen in the abdomen and lower extremities.  She feels short of breath with exertion with laying flat.  Also feels like her legs are swollen.  She does endorse some intermittent twinging type chest pain on the left side of his chest.  Patient follows with CHF clinic.  She takes Lasix and was recently started on metolazone.  Says that since starting the metolazone she has had significant cramping throughout her body.  Does not feel like she is making as much urine as before. Pt need 3.5L  supplemental O2 via [place] at home.   Clinical Impression   Upon arrival patient sitting on edge of bed.  Nursing aide present.  Patient unsafe with toilet transfer to [facility].  Sitting down without warning- lower extremities giving out.  Patient did not use assistive device at home but willing to try it 1 time.  Patient needed verbal cueing during transfer to stay up and finish transfer. Supervision with bed mobility observed patient scooting up and moving around in bed ind.  Patient has a nursing aide that comes 2 times a day in for a few hours assisting with bathing and dressing as well as other household tasks.  Pt do report tingling in B hands and forearm .  Discussed with patient safety issues during transfer as well as ADLs.  Patient wants to go home with home health-but discussed with patient depends on progress.need to be safe in her transfers.  Patient can benefit from skilled OT services to increase balance and functional mobility in transfers in ADL's as well as independence in basic ADL's.        Recommendations for follow up therapy are one  component of a multi-disciplinary discharge planning process, led by the attending physician.  Recommendations may be updated based on patient status, additional functional criteria and insurance authorization.   Assistance Recommended at Discharge    Patient can return home with the following      Functional Status Assessment     Equipment Recommendations       Recommendations for Other Services       Precautions / Restrictions Precautions Precautions: Fall Restrictions Weight Bearing Restrictions: No      Mobility Bed Mobility Overal bed mobility: Needs Assistance Bed Mobility: Supine to Sit     Supine to sit: Supervision, HOB elevated     General bed mobility comments: observe pt scooting up in bed and in bed ind    Transfers                          Balance Overall balance assessment: Needs assistance Sitting-balance support: Single extremity supported Sitting balance-Leahy Scale: Fair     Standing balance support: Bilateral upper extremity supported Standing balance-Leahy Scale: Poor Standing balance comment: unsafe - LE give out - sit without warning several times during session with OT and aid                           ADL either performed or assessed with clinical judgement   ADL Overall ADL's : Needs assistance/impaired (pt has aid  that cames and help bathing ,dressing , cooking)                           Toilet Transfer Details (indicate cue type and reason): bed to BSc min A x 2- RW- pt legs giving out - pt sit without warning- v/c for Hand placement Toileting- Clothing Manipulation and Hygiene: Set up               Vision Patient Visual Report: No change from baseline       Perception     Praxis      Pertinent Vitals/Pain Pain Assessment Pain Score: 10-Worst pain ever Pain Location: abdominal Pain Intervention(s): Monitored during session     Hand Dominance Right   Extremity/Trunk Assessment  Upper Extremity Assessment Upper Extremity Assessment: Overall WFL for tasks assessed (decrease strength)   Lower Extremity Assessment Lower Extremity Assessment: Generalized weakness       Communication Communication Communication: No difficulties   Cognition Arousal/Alertness: Awake/alert Behavior During Therapy: WFL for tasks assessed/performed Overall Cognitive Status: Within Functional Limits for tasks assessed                                       General Comments       Exercises     Shoulder Instructions      Home Living Family/patient expects to be discharged to:: Private residence Living Arrangements: Alone Available Help at Discharge: Friend(s);Available 24 hours/day Type of Home: Apartment Home Access: Level entry     Home Layout: One level     Bathroom Shower/Tub: Sponge bathes at baseline   Bathroom Toilet: Handicapped height Bathroom Accessibility: Yes   Home Equipment: Agricultural consultant (2 wheels);BSC/3in1;Shower seat   Additional Comments: Nsg aid thet cames 2 x day in for few hours - late morning and evening -assist bathing, dressing, cooking and house work      Prior Functioning/Environment Prior Level of Function : Needs assist             Mobility Comments: Pt furniture walk at home and does not like to use AD. Pt's Friend is her home health ccare provided who assits pt at household le vel ambualtion. Pt was using motorized chair which broke. ADLs Comments: Pt needs assitance. Pt takes birdbaths, cooks mininmally. Caretake helps with meds.        OT Problem List: Decreased strength;Decreased activity tolerance;Impaired balance (sitting and/or standing);Decreased safety awareness;Impaired UE functional use;Decreased knowledge of use of DME or AE      OT Treatment/Interventions: Self-care/ADL training;Therapeutic exercise;Neuromuscular education;DME and/or AE instruction;Therapeutic activities;Balance training;Patient/family  education    OT Goals(Current goals can be found in the care plan section) Acute Rehab OT Goals Patient Stated Goal: want to go home OT Goal Formulation: With patient Time For Goal Achievement: 02/06/23 Potential to Achieve Goals: Fair  OT Frequency: Min 1X/week    Co-evaluation              AM-PAC OT "6 Clicks" Daily Activity     Outcome Measure Help from another person eating meals?: None Help from another person taking care of personal grooming?: None Help from another person toileting, which includes using toliet, bedpan, or urinal?: A Little Help from another person bathing (including washing, rinsing, drying)?: A Lot Help from another person to put on and taking off regular upper body clothing?: A Lot Help  from another person to put on and taking off regular lower body clothing?: A Lot 6 Click Score: 17   End of Session Equipment Utilized During Treatment: Gait belt;Rolling walker (2 wheels) Nurse Communication: Mobility status  Activity Tolerance: Patient tolerated treatment well Patient left: in bed;with family/visitor present  OT Visit Diagnosis: Unsteadiness on feet (R26.81);Muscle weakness (generalized) (M62.81)                Time: 9811-9147 OT Time Calculation (min): 27 min Charges:  OT General Charges $OT Visit: 1 Visit OT Evaluation $OT Eval Low Complexity: 1 Low    Haizlee Henton OTR/L,CLT 01/30/2023, 4:04 PM

## 2023-01-30 NOTE — Plan of Care (Signed)
  Problem: Activity: Goal: Capacity to carry out activities will improve Outcome: Progressing   Problem: Cardiac: Goal: Ability to achieve and maintain adequate cardiopulmonary perfusion will improve Outcome: Progressing   Problem: Fluid Volume: Goal: Ability to maintain a balanced intake and output will improve Outcome: Progressing   Problem: Metabolic: Goal: Ability to maintain appropriate glucose levels will improve Outcome: Progressing   Problem: Clinical Measurements: Goal: Respiratory complications will improve Outcome: Progressing Goal: Cardiovascular complication will be avoided Outcome: Progressing   Problem: Safety: Goal: Ability to remain free from injury will improve Outcome: Progressing   Problem: Skin Integrity: Goal: Risk for impaired skin integrity will decrease Outcome: Progressing

## 2023-01-30 NOTE — Progress Notes (Signed)
   01/30/23 1400  Spiritual Encounters  Type of Visit Initial  Care provided to: Pt not available  Referral source Nurse (RN/NT/LPN)  Reason for visit Advance directives  OnCall Visit No   Patient unavailable, out to scan.  Unable to discuss advanced care plan.  Chaplain team to follow per spiritual consult request.

## 2023-01-30 NOTE — Plan of Care (Signed)
m °

## 2023-01-30 NOTE — Evaluation (Signed)
Physical Therapy Evaluation Patient Details Name: Jaime Fitzgerald MRN: [id number] DOB: 07/30/1961 Today's Date: 01/30/2023  History of Present Illness  Jaime Fitzgerald is a 62 y.o. female past medical history of HFrEF, hypertension, asthma COPD who presents with abdominal swelling and shortness of breath.  Patient tells me that for several weeks she has felt progressively swollen in the abdomen and lower extremities.  She feels short of breath with exertion with laying flat.  Also feels like her legs are swollen.  She does endorse some intermittent twinging type chest pain on the left side of his chest.  Patient follows with CHF clinic.  She takes Lasix and was recently started on metolazone.  Says that since starting the metolazone she has had significant cramping throughout her body.  Does not feel like she is making as much urine as before. Pt need 3.5L  supplemental O2 via [place] at home.  Clinical Impression  Pt received in bed with her Son by her side agreeable to PT interventions. Pt is oriented to time, person, situation. Pt PLOF includes receiving assistance from her live in friend who is a Engineer, civil (consulting). Pt had a motorized chair which is not functioning anymore. Pt has hospital bed and walker but does  not like ot use AD. Today, pt required Min assist form bed mobility, transferred with Min t mod assist. Pt is weak and in pain. Pt's pain and numbness in distal in her 1st and 2nd toe. Also reported of tingling in B hands and forearm. Pt demonstrates L4/L5 dermatome involvement and possible neuropathy related symptoms. Pt at risk fo fall 2/2 to generalized weakness and impaired sensation. PT will continue while in acute care and pt will benefit from continued PT after acute care.      Recommendations for follow up therapy are one component of a multi-disciplinary discharge planning process, led by the attending physician.  Recommendations may be updated based on patient status, additional functional criteria and  insurance authorization.  Follow Up Recommendations       Assistance Recommended at Discharge Intermittent Supervision/Assistance  Patient can return home with the following  A lot of help with walking and/or transfers;A little help with bathing/dressing/bathroom;Assistance with cooking/housework;Direct supervision/assist for medications management;Direct supervision/assist for financial management;Assist for transportation    Equipment Recommendations Rolling walker (2 wheels);BSC/3in1  Recommendations for Other Services       Functional Status Assessment Patient has had a recent decline in their functional status and demonstrates the ability to make significant improvements in function in a reasonable and predictable amount of time.     Precautions / Restrictions Precautions Precautions: Fall Restrictions Weight Bearing Restrictions: No      Mobility  Bed Mobility Overal bed mobility: Needs Assistance Bed Mobility: Supine to Sit     Supine to sit: Min assist, HOB elevated          Transfers Overall transfer level: Needs assistance Equipment used: Rolling walker (2 wheels) Transfers: Sit to/from Stand, Bed to chair/wheelchair/BSC Sit to Stand: Min guard Stand pivot transfers: Mod assist         General transfer comment: Pt has poor weigthshifting ability. so sat down prematurely.    Ambulation/Gait: unable               General Gait Details: deferred 2/2 unsafe at this point.  Stairs            Wheelchair Mobility    Modified Rankin (Stroke Patients Only)       Balance  Overall balance assessment: Needs assistance Sitting-balance support: Single extremity supported, Bilateral upper extremity supported Sitting balance-Leahy Scale: Fair Sitting balance - Comments: Poor control to sit without support.   Standing balance support: Bilateral upper extremity supported Standing balance-Leahy Scale: Fair Standing balance comment: DUe to pain  and numbness in B hands and Below knee, pt has decreased standing tolerance.                             Pertinent Vitals/Pain Pain Assessment Pain Assessment: 0-10 Pain Score: 6  Pain Location: Spine and BLE Pain Descriptors / Indicators: Burning, Aching, Shooting, Sharp Pain Intervention(s): Monitored during session    Home Living Family/patient expects to be discharged to:: Private residence Living Arrangements: Spouse/significant other Available Help at Discharge: Friend(s);Available 24 hours/day Type of Home: Apartment Home Access: Level entry       Home Layout: One level Home Equipment: Agricultural consultant (2 wheels);BSC/3in1;Shower seat      Prior Function Prior Level of Function : Needs assist             Mobility Comments: Pt furniture walk at home and does not like to use AD. Pt's Friend is her home health ccare provided who assits pt at household le vel ambualtion. Pt was using motorized chair which broke. ADLs Comments: Pt needs assitance. Pt takes birdbaths, cooks mininmally. Caretake helps with meds.     Hand Dominance   Dominant Hand: Right    Extremity/Trunk Assessment   Upper Extremity Assessment Upper Extremity Assessment: Generalized weakness    Lower Extremity Assessment Lower Extremity Assessment: Generalized weakness       Communication   Communication: No difficulties  Cognition Arousal/Alertness: Awake/alert Behavior During Therapy: WFL for tasks assessed/performed Overall Cognitive Status: Within Functional Limits for tasks assessed                                 General Comments: Pt oriented to situation, place, people but disoirented to time.        General Comments      Exercises General Exercises - Lower Extremity Ankle Circles/Pumps: AROM, 10 reps, Seated Gluteal Sets: AROM, Strengthening, Both, Seated, 10 reps Heel Slides: AROM, Both, 10 reps, Seated   Assessment/Plan    PT Assessment Patient  needs continued PT services  PT Problem List Decreased strength;Decreased activity tolerance;Decreased balance;Decreased cognition;Decreased mobility;Decreased coordination;Decreased safety awareness;Decreased knowledge of use of DME;Pain;Obesity;Impaired sensation       PT Treatment Interventions Gait training;Functional mobility training;Therapeutic activities;Therapeutic exercise;Balance training;Neuromuscular re-education;Cognitive remediation;Patient/family education    PT Goals (Current goals can be found in the Care Plan section)  Acute Rehab PT Goals Patient Stated Goal: " I want to go home to my live in nurse friend  when I feel better." PT Goal Formulation: With patient Time For Goal Achievement: 02/13/23 Potential to Achieve Goals: Good    Frequency Min 3X/week     Co-evaluation               AM-PAC PT "6 Clicks" Mobility  Outcome Measure Help needed turning from your back to your side while in a flat bed without using bedrails?: None Help needed moving from lying on your back to sitting on the side of a flat bed without using bedrails?: None Help needed moving to and from a bed to a chair (including a wheelchair)?: A Little Help needed standing up from a  chair using your arms (e.g., wheelchair or bedside chair)?: A Lot Help needed to walk in hospital room?: A Lot Help needed climbing 3-5 steps with a railing? : Total 6 Click Score: 16    End of Session Equipment Utilized During Treatment: Gait belt;Oxygen Activity Tolerance: Patient limited by fatigue;Patient limited by pain Patient left: in chair;with call bell/phone within reach;with chair alarm set;with family/visitor present Nurse Communication: Mobility status PT Visit Diagnosis: Unsteadiness on feet (R26.81);Other abnormalities of gait and mobility (R26.89);Muscle weakness (generalized) (M62.81);Difficulty in walking, not elsewhere classified (R26.2);Pain Pain - Right/Left:  (BLE) Pain - part of body:  Leg    Time: 1610-9604 PT Time Calculation (min) (ACUTE ONLY): 29 min   Charges:   PT Evaluation $PT Eval Moderate Complexity: 1 Mod PT Treatments $Therapeutic Activity: 8-22 mins        Quincy Boy PT DPT 2:18 PM,01/30/23

## 2023-01-30 NOTE — Progress Notes (Signed)
[facility] Progress Note  Patient: Jaime Fitzgerald    YNW:[id number]  DOA: 01/29/2023     Date of Service: the patient was seen and examined on 01/30/2023  Chief Complaint  Patient presents with   Fluid Retention   Brief hospital course: Jaime Fitzgerald is a 62 y.o. female with h/o NIDDM T2, HTN, HLD, asthma/COPD quit smoking, denies vaping,, GI bleed, seizure disorder, osteoarthritis, sleep apnea, as reviewed from EMR, presented to [facility] ED with complaining of shortness of breath.  Patient is having difficulty breathing for past 3 weeks, severe orthopnea, complaining of abdominal distention and lower extremity edema.  Patient denies any chest pain or palpitations.  Patient was complaining of tingling in her fourth and fifth toes and cramping in bilateral hands and off-and-on numbness.  Patient was on gabapentin which was discontinued by her PCP.  Recently started on metolazone and since then she started having cramps in her extremities. Patient's is cardiologist Dr.Arida.  ED workup vital signs stable, BNP creatinine 1.18, blood glucose 180 slightly elevated Troponin x 1 negative, BNP 4.6 within normal range, CBC macrocytosis, elevated MCV, no any other significant findings.  HbA1c 7.6 well-controlled CXR: Cardiomegaly. Central pulmonary vessels are prominent suggesting CHF. Increased interstitial markings are seen in parahilar regions and lower lung fields suggesting interstitial pulmonary edema. Possible small left pleural effusion. Abdominal x-ray negative for any acute findings  Assessment and Plan:  # Acute on chronic hypoxic respiratory failure presented with shortness of breath, dyspnea on exertion and orthopnea could be secondary to pulmonary congestion Patient has history of asthma/COPD, past history of smoking, currently vaping BNP 4.6, troponin negative. CXR shows mild pulmonary congestion S/p Lasix 40 mg IV, discontinued due to elevated BUN and creatinine Continue albuterol  as needed Continue supplemental O2 admission, currently patient is at her baseline. Follow 2D echocardiogram   # Hypomagnesemia, mag repleted. Monitor electrolytes and replete as needed.   # HTN, HLD Continue Toprol-XL 25 mg p.o. daily with holding parameters Held amlodipine, Lasix and metolazone for now due to low blood pressure Monitor BP and titrate medications accordingly  # NIDDM T2, hemoglobin A1c 7.6, well-controlled Continue NovoLog sliding scale, monitor CBG, continue diabetic diet Held home medications for now   # Tingling sensation in the toes and bilateral hands and cramps Unknown cause Continue as needed medication for pain control Folate 14.6 within normal range Follow B12 and vitamin D level   # GERD and dyspepsia Continue pantoprazole Started Maalox  # Constipation Started MiraLAX twice daily, Dulcolax x 1 dose given Started Dulcolax as needed   Body mass index is 36.51 kg/m.  Interventions:       Diet: Heart healthy/carb modified diet DVT Prophylaxis: Subcutaneous Heparin    Advance goals of care discussion: Full code  Family Communication: family was not present at bedside, at the time of interview.  The pt provided permission to discuss medical plan with the family. Opportunity was given to ask question and all questions were answered satisfactorily.   Disposition:  Pt is from Home, admitted with SOB, still has SOb, which precludes a safe discharge. Discharge to home , when stable.  Subjective: No significant events overnight, patient feels improvement in the lower extremity edema, still feels distended belly, feels bloated and constipated.  Still has some shortness of breath, tingling sensation in the third and fourth toes bilaterally and hands, cramping of hands.  Denied chest pain or palpitation, no any other active issues.  Physical Exam: General: NAD, lying comfortably  Appear in no distress, affect appropriate Eyes: PERRLA ENT: Oral  Mucosa Clear, moist  Neck: no JVD,  Cardiovascular: S1 and S2 Present, no Murmur,  Respiratory: good respiratory effort, Bilateral Air entry equal and Decreased, no Crackles, no wheezes Abdomen: Bowel Sound present, Soft and no tenderness,  Skin: no rashes Extremities: no Pedal edema, no calf tenderness Neurologic: without any new focal findings Gait not checked due to patient safety concerns  Vitals:   01/29/23 2255 01/30/23 0056 01/30/23 0500 01/30/23 0800  BP: 134/86   123/69  Pulse: 90   82  Resp: 18   19  Temp: 98.7 F (37.1 C)   98 F (36.7 C)  TempSrc:      SpO2: 100%   (!) 89%  Weight:  93.5 kg 93.5 kg   Height:  5\' 3"  (1.6 m)      Intake/Output Summary (Last 24 hours) at 01/30/2023 1349 Last data filed at 01/30/2023 1011 Gross per 24 hour  Intake 240 ml  Output --  Net 240 ml   Filed Weights   01/29/23 1738 01/30/23 0056 01/30/23 0500  Weight: 98.4 kg 93.5 kg 93.5 kg    Data Reviewed: I have personally reviewed and interpreted daily labs, tele strips, imagings as discussed above. I reviewed all nursing notes, pharmacy notes, vitals, pertinent old records I have discussed plan of care as described above with RN and patient/family.  CBC: Recent Labs  Lab 01/29/23 1741 01/29/23 2221 01/30/23 0833  WBC 6.9 7.3 5.6  HGB 15.1* 15.2* 14.9  HCT 47.1* 46.0 45.4  MCV 106.6* 105.3* 104.8*  PLT 235 236 223   Basic Metabolic Panel: Recent Labs  Lab 01/29/23 1741 01/29/23 2221 01/30/23 0833  NA 136  --  137  K 3.6  --  3.6  CL 89*  --  89*  CO2 34*  --  36*  GLUCOSE 180*  --  187*  BUN 24*  --  28*  CREATININE 1.26* 1.18* 1.33*  CALCIUM 9.1  --  8.8*  MG  --   --  1.6*  PHOS  --   --  4.7*    Studies: DG Abd 1 View  Result Date: 01/30/2023 CLINICAL DATA:  Abdominal pain EXAM: ABDOMEN - 1 VIEW COMPARISON:  None Available. FINDINGS: The bowel gas pattern is normal. No radio-opaque calculi or other significant radiographic abnormality are seen.  IMPRESSION: Negative. Electronically Signed   By: Layla Maw M.D.   On: 01/30/2023 11:04   DG Chest 2 View  Result Date: 01/29/2023 CLINICAL DATA:  Difficulty breathing, edema in lower extremities EXAM: CHEST - 2 VIEW COMPARISON:  10/13/2021 FINDINGS: Transverse diameter of heart is increased. Central pulmonary vessels are more prominent. Increased interstitial markings are seen in parahilar regions and lower lung fields on both sides. There is blunting of left lateral CP angle. There is no pneumothorax. IMPRESSION: Cardiomegaly. Central pulmonary vessels are prominent suggesting CHF. Increased interstitial markings are seen in parahilar regions and lower lung fields suggesting interstitial pulmonary edema. Possible small left pleural effusion. Electronically Signed   By: Ernie Avena M.D.   On: 01/29/2023 18:02    Scheduled Meds:  calcium carbonate  1 tablet Oral BID WC   heparin  5,000 Units Subcutaneous Q8H   insulin aspart  0-15 Units Subcutaneous TID WC   [START ON 01/31/2023] metoprolol succinate  25 mg Oral Daily   nicotine  14 mg Transdermal Daily   pantoprazole  40 mg Oral BID   polyethylene glycol  17 g Oral BID   sodium chloride flush  3 mL Intravenous Q12H   Continuous Infusions: PRN Meds: acetaminophen **OR** acetaminophen, albuterol, bisacodyl, bisacodyl, hydrALAZINE, oxyCODONE  Time spent: 35 minutes  Author: Gillis Santa. MD [facility] 01/30/2023 1:49 PM  To reach On-call, see care teams to locate the attending and reach out to them via [email or web address]. If 7PM-7AM, please contact night-coverage If you still have difficulty reaching the attending provider, please page the [facility] (Director on Call) for [facility] on amion for assistance.

## 2023-01-31 DIAGNOSIS — R0602 Shortness of breath: Secondary | ICD-10-CM | POA: Diagnosis not present

## 2023-01-31 LAB — CBC
HCT: 44.7 % (ref 36.0–46.0)
Hemoglobin: 14.5 g/dL (ref 12.0–15.0)
MCH: 33.7 pg (ref 26.0–34.0)
MCHC: 32.4 g/dL (ref 30.0–36.0)
MCV: 104 fL — ABNORMAL HIGH (ref 80.0–100.0)
Platelets: 214 10*3/uL (ref 150–400)
RBC: 4.3 MIL/uL (ref 3.87–5.11)
RDW: 13.2 % (ref 11.5–15.5)
WBC: 6.1 10*3/uL (ref 4.0–10.5)
nRBC: 0 % (ref 0.0–0.2)

## 2023-01-31 LAB — BASIC METABOLIC PANEL
Anion gap: 12 (ref 5–15)
BUN: 31 mg/dL — ABNORMAL HIGH (ref 8–23)
CO2: 37 mmol/L — ABNORMAL HIGH (ref 22–32)
Calcium: 9 mg/dL (ref 8.9–10.3)
Chloride: 85 mmol/L — ABNORMAL LOW (ref 98–111)
Creatinine, Ser: 1.33 mg/dL — ABNORMAL HIGH (ref 0.44–1.00)
GFR, Estimated: 45 mL/min — ABNORMAL LOW (ref >60)
Glucose, Bld: 149 mg/dL — ABNORMAL HIGH (ref 70–99)
Potassium: 3.7 mmol/L (ref 3.5–5.1)
Sodium: 134 mmol/L — ABNORMAL LOW (ref 135–145)

## 2023-01-31 LAB — GLUCOSE, CAPILLARY
Glucose-Capillary: 165 mg/dL — ABNORMAL HIGH (ref 70–99)
Glucose-Capillary: 193 mg/dL — ABNORMAL HIGH (ref 70–99)
Glucose-Capillary: 123 mg/dL — ABNORMAL HIGH (ref 70–99)
Glucose-Capillary: 179 mg/dL — ABNORMAL HIGH (ref 70–99)

## 2023-01-31 LAB — PHOSPHORUS: Phosphorus: 5.3 mg/dL — ABNORMAL HIGH (ref 2.5–4.6)

## 2023-01-31 LAB — MAGNESIUM: Magnesium: 2.2 mg/dL (ref 1.7–2.4)

## 2023-01-31 MED ORDER — BISACODYL 10 MG RE SUPP
10.0000 mg | Freq: Once | RECTAL | Status: AC
  Administered 2023-01-31: 10 mg via RECTAL
  Filled 2023-01-31: qty 1

## 2023-01-31 MED ORDER — ORAL CARE MOUTH RINSE: 15.0000 mL | OROMUCOSAL | Status: DC | PRN

## 2023-01-31 MED ORDER — DOXYCYCLINE HYCLATE 100 MG PO TABS
100.0000 mg | ORAL_TABLET | Freq: Two times a day (BID) | ORAL | Status: DC
  Administered 2023-01-31 – 2023-02-03 (×7): 100 mg via ORAL
  Filled 2023-01-31 (×7): qty 1

## 2023-01-31 MED ORDER — VITAMIN D (ERGOCALCIFEROL) 1.25 MG (50000 UNIT) PO CAPS
50000.0000 [IU] | ORAL_CAPSULE | ORAL | Status: DC
  Administered 2023-01-31: 50000 [IU] via ORAL
  Filled 2023-01-31: qty 1

## 2023-01-31 MED ORDER — MINERAL OIL RE ENEM: 1.0000 | ENEMA | Freq: Once | RECTAL | Status: DC

## 2023-01-31 NOTE — Progress Notes (Signed)
[facility] Progress Note  Patient: Jaime Fitzgerald    ZOX:[id number]  DOA: 01/29/2023     Date of Service: the patient was seen and examined on 01/31/2023  Chief Complaint  Patient presents with   Fluid Retention   Brief hospital course: Jaime Fitzgerald is a 62 y.o. female with h/o NIDDM T2, HTN, HLD, asthma/COPD quit smoking, denies vaping,, GI bleed, seizure disorder, osteoarthritis, sleep apnea, as reviewed from EMR, presented to [facility] ED with complaining of shortness of breath.  Patient is having difficulty breathing for past 3 weeks, severe orthopnea, complaining of abdominal distention and lower extremity edema.  Patient denies any chest pain or palpitations.  Patient was complaining of tingling in her fourth and fifth toes and cramping in bilateral hands and off-and-on numbness.  Patient was on gabapentin which was discontinued by her PCP.  Recently started on metolazone and since then she started having cramps in her extremities. Patient's is cardiologist Dr.Arida.  ED workup vital signs stable, BNP creatinine 1.18, blood glucose 180 slightly elevated Troponin x 1 negative, BNP 4.6 within normal range, CBC macrocytosis, elevated MCV, no any other significant findings.  HbA1c 7.6 well-controlled CXR: Cardiomegaly. Central pulmonary vessels are prominent suggesting CHF. Increased interstitial markings are seen in parahilar regions and lower lung fields suggesting interstitial pulmonary edema. Possible small left pleural effusion. Abdominal x-ray negative for any acute findings  Assessment and Plan:  # Acute on chronic hypoxic respiratory failure presented with shortness of breath, dyspnea on exertion and orthopnea could be secondary to pulmonary congestion Patient has history of asthma/COPD, past history of smoking, currently vaping BNP 4.6, troponin negative. CXR shows mild pulmonary congestion S/p Lasix 40 mg IV, discontinued due to elevated BUN and creatinine Continue albuterol  as needed Continue supplemental O2 admission, currently patient is at her baseline. TTE LVEF 60 to 65%, grade 1 diastolic dysfunction, no any other significant abnormalities.   CKD stage IIIa,  baseline creatinine 1.11 on 07/23/2022 4/28 creatinine 1.33 elevated US renal normal study, urinary bladder Appears normal for degree of bladder distention.  Check bladder scan to rule out urinary retention   # Hypomagnesemia, mag repleted. Monitor electrolytes and replete as needed.   # HTN, HLD Continue Toprol-XL 25 mg p.o. daily with holding parameters Held amlodipine, Lasix and metolazone for now due to low blood pressure Monitor BP and titrate medications accordingly  # NIDDM T2, hemoglobin A1c 7.6, well-controlled Continue NovoLog sliding scale, monitor CBG, continue diabetic diet Held home medications for now   # Tingling sensation in the toes and bilateral hands and cramps Unknown cause Continue as needed medication for pain control Folate 14.6 and B12 level 582 within normal range  vitamin D level 15 low, repleted.   # GERD and dyspepsia Continue pantoprazole Started Maalox  # Constipation Started MiraLAX twice daily, Dulcolax x 1 dose given Started Dulcolax as needed 4/28 no BM yet, Dulcolax suppository one-time dose ordered and mineral oil enema x 1 ordered as well if no BM after suppository   # Abscess vs boil right vulvar region Patient was feeling a bump in the right vulvar area Started doxycycline 100 mg p.o. twice daily for 7 days  # Vitamin D deficiency: started vitamin D 50,000 units p.o. weekly, follow with PCP to repeat vitamin D level after 3 to 6 months.   Body mass index is 36.51 kg/m.  Interventions:       Diet: Heart healthy/carb modified diet DVT Prophylaxis: Subcutaneous Heparin    Advance  goals of care discussion: Full code  Family Communication: family was not present at bedside, at the time of interview.  The pt provided permission to  discuss medical plan with the family. Opportunity was given to ask question and all questions were answered satisfactorily.   Disposition:  Pt is from Home, admitted with SOB, constipation, abdominal bloating, still has SOb, which precludes a safe discharge. Discharge to home , when stable.  Subjective: No significant events overnight, patient is not a very good historian, having multiple complaints, she is still not moved bowels, feels constipated and bloated.  No nausea vomiting, no abdominal pain.  Patient is complaining of bumps and breaking out in the genital area, on exam it looks patient has 1 boil in the right vulvar area.  RN was used as a Biomedical engineer. Patient is also having difficulty urinating, RN was advised to do bladder scan to rule out urinary retention   Physical Exam: General: NAD, lying comfortably Appear in no distress, affect appropriate Eyes: PERRLA ENT: Oral Mucosa Clear, moist  Neck: no JVD,  Cardiovascular: S1 and S2 Present, no Murmur,  Respiratory: good respiratory effort, Bilateral Air entry equal and Decreased, no Crackles, no wheezes Abdomen: Bowel Sound present, Soft and no tenderness, boils on the right vulvar region Skin: no rashes Extremities: no Pedal edema, no calf tenderness Neurologic: without any new focal findings Gait not checked due to patient safety concerns  Vitals:   01/30/23 0800 01/30/23 1720 01/31/23 0116 01/31/23 0805  BP: 123/69 136/78 117/78 (!) 143/87  Pulse: 82 87 92 95  Resp: 19 16 16 16   Temp: 98 F (36.7 C) 98 F (36.7 C) 97.8 F (36.6 C) 97.9 F (36.6 C)  TempSrc:      SpO2: (!) 89% 96% 92% 93%  Weight:      Height:        Intake/Output Summary (Last 24 hours) at 01/31/2023 1156 Last data filed at 01/31/2023 1037 Gross per 24 hour  Intake 720 ml  Output 700 ml  Net 20 ml   Filed Weights   01/29/23 1738 01/30/23 0056 01/30/23 0500  Weight: 98.4 kg 93.5 kg 93.5 kg    Data Reviewed: I have personally reviewed and  interpreted daily labs, tele strips, imagings as discussed above. I reviewed all nursing notes, pharmacy notes, vitals, pertinent old records I have discussed plan of care as described above with RN and patient/family.  CBC: Recent Labs  Lab 01/29/23 1741 01/29/23 2221 01/30/23 0833 01/31/23 0526  WBC 6.9 7.3 5.6 6.1  HGB 15.1* 15.2* 14.9 14.5  HCT 47.1* 46.0 45.4 44.7  MCV 106.6* 105.3* 104.8* 104.0*  PLT 235 236 223 214   Basic Metabolic Panel: Recent Labs  Lab 01/29/23 1741 01/29/23 2221 01/30/23 0833 01/31/23 0526  NA 136  --  137 134*  K 3.6  --  3.6 3.7  CL 89*  --  89* 85*  CO2 34*  --  36* 37*  GLUCOSE 180*  --  187* 149*  BUN 24*  --  28* 31*  CREATININE 1.26* 1.18* 1.33* 1.33*  CALCIUM 9.1  --  8.8* 9.0  MG  --   --  1.6* 2.2  PHOS  --   --  4.7* 5.3*    Studies: ECHOCARDIOGRAM COMPLETE  Result Date: 01/30/2023    ECHOCARDIOGRAM REPORT   Patient Name:   ADLEE PAAR Date of Exam: 01/30/2023 Medical Rec #:  [id number]      Height:  63.0 in Accession #:    [id number]     Weight:       206.1 lb Date of Birth:  1961/08/20      BSA:          1.959 m Patient Age:    62 years       BP:           134/86 mmHg Patient Gender: F              HR:           83 bpm. Exam Location:  [place] Procedure: 2D Echo, Cardiac Doppler, Color Doppler and Intracardiac            Opacification Agent Indications:    CHF-Acute Systolic 428.21 / I50.21  History:        Patient has prior history of Echocardiogram examinations, most                 recent 11/26/2021. Acute respiratory failure with hypoxia,                 Signs/Symptoms:Shortness of Breath; Risk Factors:Diabetes.  Sonographer:    Louie Boston RDCS Referring Phys: [phone] EKTA V PATEL  Sonographer Comments: Suboptimal apical window. IMPRESSIONS  1. Left ventricular ejection fraction, by estimation, is 60 to 65%. The left ventricle has normal function. The left ventricle has no regional wall motion abnormalities. There is mild left  ventricular hypertrophy. Left ventricular diastolic parameters are consistent with Grade I diastolic dysfunction (impaired relaxation).  2. Right ventricular systolic function is normal. The right ventricular size is normal. There is normal pulmonary artery systolic pressure. The estimated right ventricular systolic pressure is 26.3 mmHg.  3. The mitral valve is normal in structure. No evidence of mitral valve regurgitation. No evidence of mitral stenosis.  4. The aortic valve has an indeterminant number of cusps. Aortic valve regurgitation is not visualized. No aortic stenosis is present.  5. The inferior vena cava is normal in size with greater than 50% respiratory variability, suggesting right atrial pressure of 3 mmHg. FINDINGS  Left Ventricle: Left ventricular ejection fraction, by estimation, is 60 to 65%. The left ventricle has normal function. The left ventricle has no regional wall motion abnormalities. Definity contrast agent was given IV to delineate the left ventricular  endocardial borders. The left ventricular internal cavity size was normal in size. There is mild left ventricular hypertrophy. Left ventricular diastolic parameters are consistent with Grade I diastolic dysfunction (impaired relaxation). Right Ventricle: The right ventricular size is normal. No increase in right ventricular wall thickness. Right ventricular systolic function is normal. There is normal pulmonary artery systolic pressure. The tricuspid regurgitant velocity is 2.31 m/s, and  with an assumed right atrial pressure of 5 mmHg, the estimated right ventricular systolic pressure is 26.3 mmHg. Left Atrium: Left atrial size was normal in size. Right Atrium: Right atrial size was normal in size. Pericardium: There is no evidence of pericardial effusion. Mitral Valve: The mitral valve is normal in structure. No evidence of mitral valve regurgitation. No evidence of mitral valve stenosis. Tricuspid Valve: The tricuspid valve is normal  in structure. Tricuspid valve regurgitation is not demonstrated. No evidence of tricuspid stenosis. Aortic Valve: The aortic valve has an indeterminant number of cusps. Aortic valve regurgitation is not visualized. No aortic stenosis is present. Pulmonic Valve: The pulmonic valve was normal in structure. Pulmonic valve regurgitation is trivial. No evidence of pulmonic stenosis. Aorta: The aortic root is normal in  size and structure. Venous: The inferior vena cava is normal in size with greater than 50% respiratory variability, suggesting right atrial pressure of 3 mmHg. IAS/Shunts: No atrial level shunt detected by color flow Doppler.  LEFT VENTRICLE PLAX 2D LVIDd:         4.00 cm   Diastology LVIDs:         2.90 cm   LV e' medial:    5.66 cm/s LV PW:         1.30 cm   LV E/e' medial:  10.0 LV IVS:        1.30 cm   LV e' lateral:   6.42 cm/s LVOT diam:     2.00 cm   LV E/e' lateral: 8.8 LV SV:         59 LV SV Index:   30 LVOT Area:     3.14 cm  RIGHT VENTRICLE             IVC RV S prime:     10.10 cm/s  IVC diam: 1.21 cm LEFT ATRIUM           Index        RIGHT ATRIUM           Index LA diam:      3.60 cm 1.84 cm/m   RA Area:     10.60 cm LA Vol (A2C): 30.1 ml 15.37 ml/m  RA Volume:   21.70 ml  11.08 ml/m LA Vol (A4C): 30.4 ml 15.52 ml/m  AORTIC VALVE LVOT Vmax:   112.50 cm/s LVOT Vmean:  70.450 cm/s LVOT VTI:    0.186 m  AORTA Ao Root diam: 2.70 cm Ao Asc diam:  2.80 cm Ao Desc diam: 2.30 cm MITRAL VALVE               TRICUSPID VALVE MV Area (PHT): 2.79 cm    TR Peak grad:   21.3 mmHg MV Decel Time: 272 msec    TR Vmax:        231.00 cm/s MV E velocity: 56.60 cm/s MV A velocity: 84.40 cm/s  SHUNTS MV E/A ratio:  0.67        Systemic VTI:  0.19 m                            Systemic Diam: 2.00 cm Julien Nordmann MD Electronically signed by Julien Nordmann MD Signature Date/Time: 01/30/2023/5:08:27 PM    Final    US RENAL  Result Date: 01/30/2023 CLINICAL DATA:  Chronic renal disease EXAM: RENAL / URINARY  TRACT ULTRASOUND COMPLETE COMPARISON:  None Available. FINDINGS: Right Kidney: Renal measurements: 9.3 x 4.6 x 5.3 cm = volume: 116 mL. Echogenicity within normal limits. No mass or hydronephrosis visualized. Left Kidney: Renal measurements: 9.3 x 5.4 x 5.0 cm = volume: 133 mL. Echogenicity within normal limits. No mass or hydronephrosis visualized. Bladder: Appears normal for degree of bladder distention. Other: None. IMPRESSION: Normal study. Electronically Signed   By: Gerome Sam III M.D.   On: 01/30/2023 15:08    Scheduled Meds:  bisacodyl  10 mg Rectal Once   calcium carbonate  1 tablet Oral BID WC   doxycycline  100 mg Oral Q12H   heparin  5,000 Units Subcutaneous Q8H   insulin aspart  0-15 Units Subcutaneous TID WC   metoprolol succinate  25 mg Oral Daily   mineral oil  1 enema Rectal Once   multivitamin  with minerals  1 tablet Oral Daily   nicotine  14 mg Transdermal Daily   pantoprazole  40 mg Oral BID   polyethylene glycol  17 g Oral BID   sodium chloride flush  3 mL Intravenous Q12H   Vitamin D (Ergocalciferol)  50,000 Units Oral Q7 days   Continuous Infusions: PRN Meds: acetaminophen **OR** acetaminophen, albuterol, bisacodyl, bisacodyl, hydrALAZINE, oxyCODONE  Time spent: 35 minutes  Author: Gillis Santa. MD [facility] 01/31/2023 11:56 AM  To reach On-call, see care teams to locate the attending and reach out to them via [email or web address]. If 7PM-7AM, please contact night-coverage If you still have difficulty reaching the attending provider, please page the [facility] (Director on Call) for [facility] on amion for assistance.

## 2023-01-31 NOTE — TOC Initial Note (Signed)
Transition of Care [facility]) - Initial/Assessment Note    Patient Details  Name: Jaime Fitzgerald MRN: [id number] Date of Birth: [birth year]  Transition of Care [facility]) CM/SW Contact:    Kemper Durie, RN Phone Number: 01/31/2023, 2:55 PM  Clinical Narrative:                  Spoke with patient, admitted from home, lives alone.  State she has personal aide M-F, 2 hours each day.  She is a patient at [facility], uses [facility] for transportation and obtains medications from mail order pharmacy.  State she has an adjustable bed (not a hospital bed), a shower chair, and a walker that is broken.  State she would benefit from having a motorized wheelchair, she will discuss with provider.  Patient aware of recommendations for home health PT/OT, also aware of difficulty finding agency to accept Medicaid.  Following agencies have declined referral: [facility], [facility], [facility], [facility], [facility], [facility], [facility], and [facility].  Discussed option of attending outpatient PT/OT, state she does not feel this will work as she currently have trouble getting on/off the [facility] bus without a motorized chair, and she would need someone to accompany her (she is unsure if her personal care aide is able to do this).  She does not know the name of her PCS company.  TOC will continue to follow for safe discharge planning.   Expected Discharge Plan: Home w Home Health Services Barriers to Discharge: Continued Medical Work up   Patient Goals and CMS Choice Patient states their goals for this hospitalization and ongoing recovery are:: Home with home health          Expected Discharge Plan and Services     Post Acute Care Choice: Home Health Living arrangements for the past 2 months: Single Family Home                                      Prior Living Arrangements/Services Living arrangements for the past 2 months: Single Family Home Lives with:: Self              Current home services: DME     Activities of Daily Living Home Assistive Devices/Equipment: Oxygen, Electric scooter ADL Screening (condition at time of admission) Patient's cognitive ability adequate to safely complete daily activities?: Yes Is the patient deaf or have difficulty hearing?: No Does the patient have difficulty seeing, even when wearing glasses/contacts?: No Does the patient have difficulty concentrating, remembering, or making decisions?: No Patient able to express need for assistance with ADLs?: Yes Does the patient have difficulty dressing or bathing?: Yes Independently performs ADLs?: No Communication: Independent Dressing (OT): Needs assistance Is this a change from baseline?: Pre-admission baseline Grooming: Needs assistance Is this a change from baseline?: Pre-admission baseline Feeding: Independent Bathing: Needs assistance Is this a change from baseline?: Pre-admission baseline Toileting: Needs assistance Is this a change from baseline?: Pre-admission baseline In/Out Bed: Needs assistance Is this a change from baseline?: Pre-admission baseline Walks in Home: Dependent Is this a change from baseline?: Pre-admission baseline Does the patient have difficulty walking or climbing stairs?: Yes Weakness of Legs: Both Weakness of Arms/Hands: Both  Permission Sought/Granted                  Emotional Assessment              Admission diagnosis:  SOB (shortness of  breath) [R06.02] Hypervolemia, unspecified hypervolemia type [E87.70] Patient Active Problem List   Diagnosis Date Noted   SOB (shortness of breath) 10/13/2021   Acute respiratory failure with hypoxia (HCC) 10/13/2021   Infectious colitis 09/18/2020   History of gastric ulcer with hemorrhage 09/17/2020   Hematemesis 09/17/2020   Enteropathogenic Escherichia coli infection 09/17/2020   Campylobacter intestinal infection 09/17/2020   Melena 09/17/2020   Acute infective gastroenteritis 09/17/2020   Acute colitis  09/17/2020   Screening for colon cancer    Other synovitis and tenosynovitis, unspecified hand 10/31/2019   Diverticulosis 10/31/2019   Migraine headache 10/31/2019   Ovarian cyst 10/31/2019   Pain in thoracic spine 10/31/2019   Seizures (HCC) 10/31/2019   Acute GI bleeding 10/11/2019   Hypertension 10/11/2019   AKI (acute kidney injury) (HCC) 10/11/2019   Syncope and collapse 10/11/2019   Type 2 diabetes mellitus (HCC) 10/11/2019   Acute blood loss anemia 10/11/2019   Acute gastric ulcer with hemorrhage    PCP:  [facility]:   [facility] [address] [place] - [address] [phone] [address] [place] [address] Phone: [phone] Fax: [phone]  [id number] - [place], [place] - [address] [place] [address] Phone: [phone] Fax: [phone]     Social Determinants of Health (SDOH) Social History: SDOH Screenings   Food Insecurity: No Food Insecurity (01/29/2023)  Housing: Low Risk  (01/29/2023)  Transportation Needs: Unmet Transportation Needs (01/29/2023)  Utilities: Not At Risk (01/29/2023)  Depression (PHQ2-9): Low Risk  (07/07/2022)  Tobacco Use: Medium Risk (01/30/2023)   SDOH Interventions:     Readmission Risk Interventions     No data to display

## 2023-01-31 NOTE — Progress Notes (Signed)
   01/31/23 1000  Spiritual Encounters  Type of Visit Initial  Care provided to: Patient  Referral source Nurse (RN/NT/LPN)  Reason for visit Advance directives  OnCall Visit Yes   Chaplain responded to request for AD information. Packet given and explained. Patient to request Chaplain services when ready to sign.

## 2023-01-31 NOTE — Plan of Care (Signed)
  Problem: Activity: Goal: Capacity to carry out activities will improve Outcome: Progressing   Problem: Cardiac: Goal: Ability to achieve and maintain adequate cardiopulmonary perfusion will improve Outcome: Progressing   Problem: Fluid Volume: Goal: Ability to maintain a balanced intake and output will improve Outcome: Progressing   Problem: Metabolic: Goal: Ability to maintain appropriate glucose levels will improve Outcome: Progressing   Problem: Skin Integrity: Goal: Risk for impaired skin integrity will decrease Outcome: Progressing   Problem: Pain Managment: Goal: General experience of comfort will improve Outcome: Progressing   Problem: Safety: Goal: Ability to remain free from injury will improve Outcome: Progressing

## 2023-02-01 DIAGNOSIS — R0602 Shortness of breath: Secondary | ICD-10-CM | POA: Diagnosis not present

## 2023-02-01 LAB — BASIC METABOLIC PANEL
Anion gap: 10 (ref 5–15)
BUN: 37 mg/dL — ABNORMAL HIGH (ref 8–23)
CO2: 38 mmol/L — ABNORMAL HIGH (ref 22–32)
Calcium: 9.6 mg/dL (ref 8.9–10.3)
Chloride: 86 mmol/L — ABNORMAL LOW (ref 98–111)
Creatinine, Ser: 1.19 mg/dL — ABNORMAL HIGH (ref 0.44–1.00)
GFR, Estimated: 51 mL/min — ABNORMAL LOW (ref >60)
Glucose, Bld: 165 mg/dL — ABNORMAL HIGH (ref 70–99)
Potassium: 3.6 mmol/L (ref 3.5–5.1)
Sodium: 134 mmol/L — ABNORMAL LOW (ref 135–145)

## 2023-02-01 LAB — GLUCOSE, CAPILLARY
Glucose-Capillary: 177 mg/dL — ABNORMAL HIGH (ref 70–99)
Glucose-Capillary: 166 mg/dL — ABNORMAL HIGH (ref 70–99)
Glucose-Capillary: 145 mg/dL — ABNORMAL HIGH (ref 70–99)
Glucose-Capillary: 148 mg/dL — ABNORMAL HIGH (ref 70–99)

## 2023-02-01 LAB — MAGNESIUM: Magnesium: 1.9 mg/dL (ref 1.7–2.4)

## 2023-02-01 LAB — CBC
HCT: 43.5 % (ref 36.0–46.0)
Hemoglobin: 14.1 g/dL (ref 12.0–15.0)
MCH: 33.9 pg (ref 26.0–34.0)
MCHC: 32.4 g/dL (ref 30.0–36.0)
MCV: 104.6 fL — ABNORMAL HIGH (ref 80.0–100.0)
Platelets: 209 10*3/uL (ref 150–400)
RBC: 4.16 MIL/uL (ref 3.87–5.11)
RDW: 13.1 % (ref 11.5–15.5)
WBC: 6.4 10*3/uL (ref 4.0–10.5)
nRBC: 0 % (ref 0.0–0.2)

## 2023-02-01 LAB — PHOSPHORUS: Phosphorus: 4.7 mg/dL — ABNORMAL HIGH (ref 2.5–4.6)

## 2023-02-01 MED ORDER — KETOTIFEN FUMARATE 0.035 % OP SOLN
1.0000 [drp] | Freq: Two times a day (BID) | OPHTHALMIC | Status: DC
  Administered 2023-02-01 – 2023-02-03 (×5): 1 [drp] via OPHTHALMIC
  Filled 2023-02-01: qty 5

## 2023-02-01 MED ORDER — ACETAMINOPHEN 325 MG PO TABS
650.0000 mg | ORAL_TABLET | Freq: Once | ORAL | Status: AC
  Administered 2023-02-01: 650 mg via ORAL
  Filled 2023-02-01: qty 2

## 2023-02-01 MED ORDER — POLYVINYL ALCOHOL 1.4 % OP SOLN
2.0000 [drp] | Freq: Three times a day (TID) | OPHTHALMIC | Status: AC
  Administered 2023-02-01 – 2023-02-03 (×6): 2 [drp] via OPHTHALMIC
  Filled 2023-02-01: qty 15

## 2023-02-01 MED ORDER — POLYVINYL ALCOHOL 1.4 % OP SOLN: 2.0000 [drp] | OPHTHALMIC | Status: DC | PRN

## 2023-02-01 MED ORDER — BISACODYL 5 MG PO TBEC
10.0000 mg | DELAYED_RELEASE_TABLET | Freq: Every day | ORAL | Status: DC
  Administered 2023-02-01: 10 mg via ORAL
  Filled 2023-02-01: qty 2

## 2023-02-01 MED ORDER — TRAZODONE HCL 50 MG PO TABS
50.0000 mg | ORAL_TABLET | Freq: Every day | ORAL | Status: DC
  Administered 2023-02-01 – 2023-02-02 (×2): 50 mg via ORAL
  Filled 2023-02-01 (×2): qty 1

## 2023-02-01 MED ORDER — LORATADINE 10 MG PO TABS
10.0000 mg | ORAL_TABLET | Freq: Every day | ORAL | Status: DC
  Administered 2023-02-01 – 2023-02-03 (×3): 10 mg via ORAL
  Filled 2023-02-01 (×3): qty 1

## 2023-02-01 MED ORDER — MELATONIN 5 MG PO TABS
5.0000 mg | ORAL_TABLET | Freq: Every day | ORAL | Status: DC
  Administered 2023-02-01 – 2023-02-02 (×2): 5 mg via ORAL
  Filled 2023-02-01 (×2): qty 1

## 2023-02-01 MED ORDER — MINERAL OIL RE ENEM
1.0000 | ENEMA | Freq: Once | RECTAL | Status: AC
  Administered 2023-02-01: 1 via RECTAL

## 2023-02-01 NOTE — Progress Notes (Signed)
[facility] Progress Note  Patient: Jaime Fitzgerald    ZOX:[id number]  DOA: 01/29/2023     Date of Service: the patient was seen and examined on 02/01/2023  Chief Complaint  Patient presents with   Fluid Retention   Brief hospital course: Jaime Fitzgerald is a 62 y.o. female with h/o NIDDM T2, HTN, HLD, asthma/COPD quit smoking, denies vaping,, GI bleed, seizure disorder, osteoarthritis, sleep apnea, as reviewed from EMR, presented to [facility] ED with complaining of shortness of breath.  Patient is having difficulty breathing for past 3 weeks, severe orthopnea, complaining of abdominal distention and lower extremity edema.  Patient denies any chest pain or palpitations.  Patient was complaining of tingling in her fourth and fifth toes and cramping in bilateral hands and off-and-on numbness.  Patient was on gabapentin which was discontinued by her PCP.  Recently started on metolazone and since then she started having cramps in her extremities. Patient's is cardiologist Dr.Arida.  ED workup vital signs stable, BNP creatinine 1.18, blood glucose 180 slightly elevated Troponin x 1 negative, BNP 4.6 within normal range, CBC macrocytosis, elevated MCV, no any other significant findings.  HbA1c 7.6 well-controlled CXR: Cardiomegaly. Central pulmonary vessels are prominent suggesting CHF. Increased interstitial markings are seen in parahilar regions and lower lung fields suggesting interstitial pulmonary edema. Possible small left pleural effusion. Abdominal x-ray negative for any acute findings  Assessment and Plan:  # Acute on chronic hypoxic respiratory failure presented with shortness of breath, dyspnea on exertion and orthopnea could be secondary to pulmonary congestion Patient has history of asthma/COPD, past history of smoking, currently vaping BNP 4.6, troponin negative. CXR shows mild pulmonary congestion S/p Lasix 40 mg IV, discontinued due to elevated BUN and creatinine Continue albuterol  as needed Continue supplemental O2 admission, currently patient is at her baseline. TTE LVEF 60 to 65%, grade 1 diastolic dysfunction, no any other significant abnormalities.   CKD stage IIIa,  baseline creatinine 1.11 on 07/23/2022 4/28 creatinine 1.33 elevated US renal normal study, urinary bladder Appears normal for degree of bladder distention.  Check bladder scan to rule out urinary retention   # Hypomagnesemia, mag repleted. Monitor electrolytes and replete as needed.   # HTN, HLD Continue Toprol-XL 25 mg p.o. daily with holding parameters Held amlodipine, Lasix and metolazone for now due to low blood pressure Monitor BP and titrate medications accordingly  # NIDDM T2, hemoglobin A1c 7.6, well-controlled Continue NovoLog sliding scale, monitor CBG, continue diabetic diet Held home medications for now   # Tingling sensation in the toes and bilateral hands and cramps Unknown cause Continue as needed medication for pain control Folate 14.6 and B12 level 582 within normal range  vitamin D level 15 low, repleted.   # GERD and dyspepsia Continue pantoprazole Started Maalox  # Constipation Started MiraLAX twice daily, Dulcolax x 1 dose given Started Dulcolax as needed 4/28 no BM yet, Dulcolax suppository one-time dose ordered and mineral oil enema x 1 ordered as well if no BM after suppository 4/29 mineral oil enema ordered today, it was not given yesterday Dulcolax nightly scheduled order placed   # Abscess vs boil right vulvar region Patient was feeling a bump in the right vulvar area Started doxycycline 100 mg p.o. twice daily for 7 days  # Vitamin D deficiency: started vitamin D 50,000 units p.o. weekly, follow with PCP to repeat vitamin D level after 3 to 6 months.  Allergic conjunctivitis, patient was complaining of itching of eyes and some redness  Started ketotifen eyedrops twice daily and fresh tears   Body mass index is 36.51 kg/m.  Interventions:        Diet: Heart healthy/carb modified diet DVT Prophylaxis: Subcutaneous Heparin    Advance goals of care discussion: Full code  Family Communication: family was not present at bedside, at the time of interview.  The pt provided permission to discuss medical plan with the family. Opportunity was given to ask question and all questions were answered satisfactorily.   Disposition:  Pt is from Home, admitted with SOB, constipation, abdominal bloating, still has constipation, insomnia, headache, itching of eyes nasal congestion,  which precludes a safe discharge. Discharge to home , when stable.  Most likely tomorrow a.m.  Subjective: No significant events overnight, but patient has several complaints, multisystem involvement, complaining of decreased sleep, itching of the eyes, nasal congestion, still bloated, did not move bowels.  Still feels some numbness in the toes and left arm.  Feels improvement in the genital boils. patient had several complaints.  No chest pain or palpitations, no shortness of breath.    Physical Exam: General: NAD, lying comfortably Appear in no distress, affect appropriate Eyes: PERRLA ENT: Oral Mucosa Clear, moist  Neck: no JVD,  Cardiovascular: S1 and S2 Present, no Murmur,  Respiratory: good respiratory effort, Bilateral Air entry equal and Decreased, no Crackles, no wheezes Abdomen: Bowel Sound present, Soft and no tenderness, boils on the right vulvar region Skin: no rashes Extremities: no Pedal edema, no calf tenderness Neurologic: without any new focal findings Gait not checked due to patient safety concerns  Vitals:   01/31/23 0805 01/31/23 2254 02/01/23 0500 02/01/23 0914  BP: (!) 143/87 130/65  131/69  Pulse: 95 81  88  Resp: 16 18  16   Temp: 97.9 F (36.6 C) 98 F (36.7 C)  98.3 F (36.8 C)  TempSrc:  Oral  Oral  SpO2: 93% 100%  99%  Weight:   95.6 kg   Height:        Intake/Output Summary (Last 24 hours) at 02/01/2023 1439 Last data  filed at 01/31/2023 1916 Gross per 24 hour  Intake 240 ml  Output --  Net 240 ml   Filed Weights   01/30/23 0056 01/30/23 0500 02/01/23 0500  Weight: 93.5 kg 93.5 kg 95.6 kg    Data Reviewed: I have personally reviewed and interpreted daily labs, tele strips, imagings as discussed above. I reviewed all nursing notes, pharmacy notes, vitals, pertinent old records I have discussed plan of care as described above with RN and patient/family.  CBC: Recent Labs  Lab 01/29/23 1741 01/29/23 2221 01/30/23 0833 01/31/23 0526 02/01/23 0415  WBC 6.9 7.3 5.6 6.1 6.4  HGB 15.1* 15.2* 14.9 14.5 14.1  HCT 47.1* 46.0 45.4 44.7 43.5  MCV 106.6* 105.3* 104.8* 104.0* 104.6*  PLT 235 236 223 214 209   Basic Metabolic Panel: Recent Labs  Lab 01/29/23 1741 01/29/23 2221 01/30/23 0833 01/31/23 0526 02/01/23 0415  NA 136  --  137 134* 134*  K 3.6  --  3.6 3.7 3.6  CL 89*  --  89* 85* 86*  CO2 34*  --  36* 37* 38*  GLUCOSE 180*  --  187* 149* 165*  BUN 24*  --  28* 31* 37*  CREATININE 1.26* 1.18* 1.33* 1.33* 1.19*  CALCIUM 9.1  --  8.8* 9.0 9.6  MG  --   --  1.6* 2.2 1.9  PHOS  --   --  4.7* 5.3* 4.7*  Studies: No results found.  Scheduled Meds:  bisacodyl  10 mg Oral QHS   calcium carbonate  1 tablet Oral BID WC   doxycycline  100 mg Oral Q12H   heparin  5,000 Units Subcutaneous Q8H   insulin aspart  0-15 Units Subcutaneous TID WC   ketotifen  1 drop Both Eyes BID   loratadine  10 mg Oral Daily   melatonin  5 mg Oral QHS   metoprolol succinate  25 mg Oral Daily   multivitamin with minerals  1 tablet Oral Daily   nicotine  14 mg Transdermal Daily   pantoprazole  40 mg Oral BID   polyethylene glycol  17 g Oral BID   sodium chloride flush  3 mL Intravenous Q12H   traZODone  50 mg Oral QHS   Vitamin D (Ergocalciferol)  50,000 Units Oral Q7 days   Continuous Infusions: PRN Meds: acetaminophen **OR** acetaminophen, albuterol, bisacodyl, bisacodyl, hydrALAZINE, mouth rinse,  oxyCODONE  Time spent: 50 minutes  Author: Gillis Santa. MD [facility] 02/01/2023 2:39 PM  To reach On-call, see care teams to locate the attending and reach out to them via [email or web address]. If 7PM-7AM, please contact night-coverage If you still have difficulty reaching the attending provider, please page the [facility] (Director on Call) for [facility] on amion for assistance.

## 2023-02-01 NOTE — Progress Notes (Signed)
Physical Therapy Treatment Patient Details Name: Jaime Fitzgerald MRN: [id number] DOB: [birth year] Today's Date: 02/01/2023   History of Present Illness Jaime Fitzgerald is a 62 y.o. female past medical history of HFrEF, hypertension, asthma COPD who presents with abdominal swelling and shortness of breath.  Patient tells me that for several weeks she has felt progressively swollen in the abdomen and lower extremities.  She feels short of breath with exertion with laying flat.  Also feels like her legs are swollen.  She does endorse some intermittent twinging type chest pain on the left side of his chest.  Patient follows with CHF clinic.  She takes Lasix and was recently started on metolazone.  Says that since starting the metolazone she has had significant cramping throughout her body.  Does not feel like she is making as much urine as before. Pt need 3.5L  supplemental O2 via [place] at home.    PT Comments    Pt up in chair at start of session on room air, stated she uses O2 only at night, spO2 ranging from 88-91%. Improved ability to transfer and tolerate ambulation today; sit <> stand several times with cues for hand placement, CGA. She ambulated ~28ft total with 3 sitting rest breaks due to pt reported fatigue, returned to bed for enema per RN request. The patient would benefit from further skilled PT intervention to continue to progress towards goals.      Recommendations for follow up therapy are one component of a multi-disciplinary discharge planning process, led by the attending physician.  Recommendations may be updated based on patient status, additional functional criteria and insurance authorization.  Follow Up Recommendations       Assistance Recommended at Discharge Intermittent Supervision/Assistance  Patient can return home with the following A lot of help with walking and/or transfers;A little help with bathing/dressing/bathroom;Assistance with cooking/housework;Direct  supervision/assist for medications management;Direct supervision/assist for financial management;Assist for transportation   Equipment Recommendations  Rolling walker (2 wheels);BSC/3in1    Recommendations for Other Services       Precautions / Restrictions Precautions Precautions: Fall Restrictions Weight Bearing Restrictions: No     Mobility  Bed Mobility                    Transfers Overall transfer level: Needs assistance Equipment used: Rolling walker (2 wheels) Transfers: Sit to/from Stand Sit to Stand: Supervision           General transfer comment: cued for hand placement each time    Ambulation/Gait   Gait Distance (Feet):  (70ft total) Assistive device: Rolling walker (2 wheels)             Stairs             Wheelchair Mobility    Modified Rankin (Stroke Patients Only)       Balance Overall balance assessment: Needs assistance Sitting-balance support: Single extremity supported Sitting balance-Leahy Scale: Fair Sitting balance - Comments: Poor control to sit without support.   Standing balance support: Bilateral upper extremity supported, Reliant on assistive device for balance Standing balance-Leahy Scale: Poor                              Cognition Arousal/Alertness: Awake/alert Behavior During Therapy: WFL for tasks assessed/performed Overall Cognitive Status: Within Functional Limits for tasks assessed  Exercises      General Comments        Pertinent Vitals/Pain Pain Assessment Pain Assessment: Faces Faces Pain Scale: Hurts a little bit Pain Location: abdominal Pain Descriptors / Indicators: Grimacing Pain Intervention(s): Monitored during session, Repositioned    Home Living                          Prior Function            PT Goals (current goals can now be found in the care plan section) Progress towards PT goals:  Progressing toward goals    Frequency    Min 3X/week      PT Plan Current plan remains appropriate    Co-evaluation              AM-PAC PT "6 Clicks" Mobility   Outcome Measure  Help needed turning from your back to your side while in a flat bed without using bedrails?: None Help needed moving from lying on your back to sitting on the side of a flat bed without using bedrails?: None Help needed moving to and from a bed to a chair (including a wheelchair)?: A Little Help needed standing up from a chair using your arms (e.g., wheelchair or bedside chair)?: A Little Help needed to walk in hospital room?: A Little Help needed climbing 3-5 steps with a railing? : A Lot 6 Click Score: 19    End of Session   Activity Tolerance: Patient tolerated treatment well;Patient limited by fatigue Patient left: in bed;with call bell/phone within reach;with nursing/sitter in room Nurse Communication: Mobility status PT Visit Diagnosis: Unsteadiness on feet (R26.81);Other abnormalities of gait and mobility (R26.89);Muscle weakness (generalized) (M62.81);Difficulty in walking, not elsewhere classified (R26.2);Pain Pain - Right/Left:  (bilateral) Pain - part of body: Leg     Time: 1610-9604 PT Time Calculation (min) (ACUTE ONLY): 13 min  Charges:  $Therapeutic Activity: 8-22 mins                     Olga Coaster PT, DPT 2:13 PM,02/01/23

## 2023-02-01 NOTE — TOC Progression Note (Signed)
Transition of Care [facility]) - Progression Note    Patient Details  Name: ABBEE CREMEENS MRN: [id number] Date of Birth: 1961/01/24  [facility] CM/SW Contact  Marlowe Sax, RN Phone Number: 02/01/2023, 11:36 AM  Clinical Narrative:   She is not able to get [facility] services due to her Ins.  She will not get a motorized wheelchair during this admission and will need to go thru her PCP     Expected Discharge Plan: Home w Home Health Services Barriers to Discharge: Continued Medical Work up  Expected Discharge Plan and Services     Post Acute Care Choice: Home Health Living arrangements for the past 2 months: Single Family Home                                       Social Determinants of Health (SDOH) Interventions SDOH Screenings   Food Insecurity: No Food Insecurity (01/29/2023)  Housing: Low Risk  (01/29/2023)  Transportation Needs: Unmet Transportation Needs (01/29/2023)  Utilities: Not At Risk (01/29/2023)  Depression (PHQ2-9): Low Risk  (07/07/2022)  Tobacco Use: Medium Risk (01/30/2023)    Readmission Risk Interventions     No data to display

## 2023-02-02 DIAGNOSIS — R0602 Shortness of breath: Secondary | ICD-10-CM | POA: Diagnosis not present

## 2023-02-02 LAB — MAGNESIUM: Magnesium: 1.5 mg/dL — ABNORMAL LOW (ref 1.7–2.4)

## 2023-02-02 LAB — BASIC METABOLIC PANEL
Anion gap: 11 (ref 5–15)
BUN: 31 mg/dL — ABNORMAL HIGH (ref 8–23)
CO2: 34 mmol/L — ABNORMAL HIGH (ref 22–32)
Calcium: 8.8 mg/dL — ABNORMAL LOW (ref 8.9–10.3)
Chloride: 90 mmol/L — ABNORMAL LOW (ref 98–111)
Creatinine, Ser: 1.17 mg/dL — ABNORMAL HIGH (ref 0.44–1.00)
GFR, Estimated: 52 mL/min — ABNORMAL LOW (ref >60)
Glucose, Bld: 162 mg/dL — ABNORMAL HIGH (ref 70–99)
Potassium: 3.8 mmol/L (ref 3.5–5.1)
Sodium: 135 mmol/L (ref 135–145)

## 2023-02-02 LAB — PHOSPHORUS: Phosphorus: 4.2 mg/dL (ref 2.5–4.6)

## 2023-02-02 LAB — CBC
HCT: 44.6 % (ref 36.0–46.0)
Hemoglobin: 14.7 g/dL (ref 12.0–15.0)
MCH: 34.1 pg — ABNORMAL HIGH (ref 26.0–34.0)
MCHC: 33 g/dL (ref 30.0–36.0)
MCV: 103.5 fL — ABNORMAL HIGH (ref 80.0–100.0)
Platelets: 207 10*3/uL (ref 150–400)
RBC: 4.31 MIL/uL (ref 3.87–5.11)
RDW: 13 % (ref 11.5–15.5)
WBC: 6.8 10*3/uL (ref 4.0–10.5)
nRBC: 0 % (ref 0.0–0.2)

## 2023-02-02 LAB — GLUCOSE, CAPILLARY
Glucose-Capillary: 145 mg/dL — ABNORMAL HIGH (ref 70–99)
Glucose-Capillary: 201 mg/dL — ABNORMAL HIGH (ref 70–99)
Glucose-Capillary: 114 mg/dL — ABNORMAL HIGH (ref 70–99)
Glucose-Capillary: 156 mg/dL — ABNORMAL HIGH (ref 70–99)

## 2023-02-02 MED ORDER — MAGNESIUM SULFATE 2 GM/50ML IV SOLN
2.0000 g | Freq: Once | INTRAVENOUS | Status: AC
  Administered 2023-02-02: 2 g via INTRAVENOUS
  Filled 2023-02-02: qty 50

## 2023-02-02 MED ORDER — MINERAL OIL RE ENEM
1.0000 | ENEMA | Freq: Once | RECTAL | Status: AC
  Administered 2023-02-02: 1 via RECTAL

## 2023-02-02 MED ORDER — DICYCLOMINE HCL 10 MG PO CAPS
10.0000 mg | ORAL_CAPSULE | Freq: Once | ORAL | Status: AC
  Administered 2023-02-02: 10 mg via ORAL
  Filled 2023-02-02: qty 1

## 2023-02-02 NOTE — Progress Notes (Signed)
Occupational Therapy Treatment Patient Details Name: Jaime Fitzgerald MRN: [id number] DOB: 1961-04-11 Today's Date: 02/02/2023   History of present illness Jaime Fitzgerald is a 62 y.o. female past medical history of HFrEF, hypertension, asthma COPD who presents with abdominal swelling and shortness of breath.  Patient tells me that for several weeks she has felt progressively swollen in the abdomen and lower extremities.  She feels short of breath with exertion with laying flat.  Also feels like her legs are swollen.  She does endorse some intermittent twinging type chest pain on the left side of his chest.  Patient follows with CHF clinic.  She takes Lasix and was recently started on metolazone.  Says that since starting the metolazone she has had significant cramping throughout her body.  Does not feel like she is making as much urine as before. Pt need 3.5L  supplemental O2 via [place] at home.   OT comments  Patient received supine in bed and agreeable to OT. Pt required Min A for supine to sit this date. Once sitting EOB, she required Max A for LB dressing (receives assistance from aide at baseline). Pt endorsed needing to use the bathroom and completed BSC transfer with CGA-Min A using a RW (knee buckling noted upon standing requiring Min A to correct). Pt with continent void on BSC and completed peri care in sitting with set up A. Pt then completed functional mobility to recliner and was left with all needs in reach. Pt is making progress toward goal completion. D/C recommendation remains appropriate. OT will continue to follow acutely.   Pt received on 2L O2 via [place], SpO2 95% at rest. O2 removed for mobility per pt request & due to pt only wearing O2 at night. SpO2 dropping to 87% on RA once sitting EOB. Pt placed back on 2L for remainder of activity with sats >90%.     Recommendations for follow up therapy are one component of a multi-disciplinary discharge planning process, led by the attending  physician.  Recommendations may be updated based on patient status, additional functional criteria and insurance authorization.    Assistance Recommended at Discharge Intermittent Supervision/Assistance  Patient can return home with the following  A lot of help with bathing/dressing/bathroom;Assistance with cooking/housework;Assist for transportation;Help with stairs or ramp for entrance;A little help with walking and/or transfers   Equipment Recommendations  BSC/3in1    Recommendations for Other Services      Precautions / Restrictions Precautions Precautions: Fall Restrictions Weight Bearing Restrictions: No       Mobility Bed Mobility Overal bed mobility: Needs Assistance Bed Mobility: Supine to Sit     Supine to sit: Min assist, HOB elevated (assist for trunk elevation)     General bed mobility comments: Min A to scoot hips forward at EOB    Transfers Overall transfer level: Needs assistance Equipment used: Rolling walker (2 wheels) Transfers: Sit to/from Stand Sit to Stand: Min guard, Min assist           General transfer comment: several instances of knee buckling noted upon standing requiring Min A to correct     Balance Overall balance assessment: Needs assistance Sitting-balance support: Bilateral upper extremity supported, Feet supported Sitting balance-Leahy Scale: Good     Standing balance support: Bilateral upper extremity supported, Reliant on assistive device for balance Standing balance-Leahy Scale: Poor           ADL either performed or assessed with clinical judgement   ADL Overall ADL's : Needs assistance/impaired  Lower Body Dressing: Maximal assistance;Sitting/lateral leans Lower Body Dressing Details (indicate cue type and reason): to don B socks (receives assistance from aide at baseline)  Toilet Transfer: Min guard;Minimal assistance;BSC/3in1;Rolling walker (2 wheels);Ambulation Toilet Transfer Details (indicate cue type and  reason): short ambulatory transfer  Toileting- Clothing Manipulation and Hygiene: Set up;Supervision/safety;Sitting/lateral lean Toileting - Clothing Manipulation Details (indicate cue type and reason): for peri care after continent void on [facility]     Functional mobility during ADLs: Min guard;Minimal assistance;Rolling walker (2 wheels) (to take several steps from EOB>BSC then BSC>recliner, several instances of knee buckling noted upon standing requiring Min A to correct)      Extremity/Trunk Assessment Upper Extremity Assessment Upper Extremity Assessment: Generalized weakness   Lower Extremity Assessment Lower Extremity Assessment: Generalized weakness        Vision Patient Visual Report: No change from baseline     Perception     Praxis      Cognition Arousal/Alertness: Awake/alert Behavior During Therapy: WFL for tasks assessed/performed Overall Cognitive Status: Within Functional Limits for tasks assessed              Exercises      Shoulder Instructions       General Comments Pt received on 2L O2 via [place], SpO2 95% at rest. O2 removed for mobility per pt request & due to pt only wearing O2 at night, SpO2 dropping to 87% once sitting EOB. Pt placed back on 2L for remainder of activity.    Pertinent Vitals/ Pain       Pain Assessment Pain Assessment: Faces Faces Pain Scale: Hurts a little bit Pain Location: abdominal Pain Descriptors / Indicators: Grimacing Pain Intervention(s): Monitored during session, Repositioned  Home Living            Prior Functioning/Environment              Frequency  Min 1X/week        Progress Toward Goals  OT Goals(current goals can now be found in the care plan section)  Progress towards OT goals: Progressing toward goals  Acute Rehab OT Goals Patient Stated Goal: go home OT Goal Formulation: With patient Time For Goal Achievement: 02/06/23 Potential to Achieve Goals: Fair  Plan Discharge plan remains  appropriate;Frequency remains appropriate    Co-evaluation                 AM-PAC OT "6 Clicks" Daily Activity     Outcome Measure   Help from another person eating meals?: None Help from another person taking care of personal grooming?: None Help from another person toileting, which includes using toliet, bedpan, or urinal?: A Little Help from another person bathing (including washing, rinsing, drying)?: A Lot Help from another person to put on and taking off regular upper body clothing?: A Little Help from another person to put on and taking off regular lower body clothing?: A Lot 6 Click Score: 18    End of Session Equipment Utilized During Treatment: Gait belt;Rolling walker (2 wheels)  OT Visit Diagnosis: Unsteadiness on feet (R26.81);Muscle weakness (generalized) (M62.81)   Activity Tolerance Patient tolerated treatment well   Patient Left in chair;with call bell/phone within reach;with chair alarm set   Nurse Communication Mobility status        Time: [phone] OT Time Calculation (min): 16 min  Charges: OT General Charges $OT Visit: 1 Visit OT Treatments $Self Care/Home Management : 8-22 mins  [facility] MS, OTR/L ascom [phone]  02/02/23, 11:21 AM

## 2023-02-02 NOTE — Progress Notes (Signed)
[facility] Progress Note  Patient: Jaime Fitzgerald    ZOX:[id number]  DOA: 01/29/2023     Date of Service: the patient was seen and examined on 02/02/2023  Chief Complaint  Patient presents with   Fluid Retention   Brief hospital course: Jaime Fitzgerald is a 62 y.o. female with h/o NIDDM T2, HTN, HLD, asthma/COPD quit smoking, denies vaping,, GI bleed, seizure disorder, osteoarthritis, sleep apnea, as reviewed from EMR, presented to [facility] ED with complaining of shortness of breath.  Patient is having difficulty breathing for past 3 weeks, severe orthopnea, complaining of abdominal distention and lower extremity edema.  Patient denies any chest pain or palpitations.  Patient was complaining of tingling in her fourth and fifth toes and cramping in bilateral hands and off-and-on numbness.  Patient was on gabapentin which was discontinued by her PCP.  Recently started on metolazone and since then she started having cramps in her extremities. Patient's is cardiologist Dr.Arida.  ED workup vital signs stable, BNP creatinine 1.18, blood glucose 180 slightly elevated Troponin x 1 negative, BNP 4.6 within normal range, CBC macrocytosis, elevated MCV, no any other significant findings.  HbA1c 7.6 well-controlled CXR: Cardiomegaly. Central pulmonary vessels are prominent suggesting CHF. Increased interstitial markings are seen in parahilar regions and lower lung fields suggesting interstitial pulmonary edema. Possible small left pleural effusion. Abdominal x-ray negative for any acute findings  Assessment and Plan:  # Acute on chronic hypoxic respiratory failure presented with shortness of breath, dyspnea on exertion and orthopnea could be secondary to pulmonary congestion Patient has history of asthma/COPD, past history of smoking, currently vaping BNP 4.6, troponin negative. CXR shows mild pulmonary congestion S/p Lasix 40 mg IV, discontinued due to elevated BUN and creatinine Continue albuterol  as needed Continue supplemental O2 admission, currently patient is at her baseline. TTE LVEF 60 to 65%, grade 1 diastolic dysfunction, no any other significant abnormalities.   CKD stage IIIa,  baseline creatinine 1.11 on 07/23/2022 4/28 creatinine 1.33 elevated US renal normal study, urinary bladder Appears normal for degree of bladder distention. bladder scan ruled out urinary retention Cr 1.17, gradually improving   # Hypomagnesemia, mag repleted. Monitor electrolytes and replete as needed.   # HTN, HLD Continue Toprol-XL 25 mg p.o. daily with holding parameters Held amlodipine, Lasix and metolazone for now due to low blood pressure Monitor BP and titrate medications accordingly  # NIDDM T2, hemoglobin A1c 7.6, well-controlled Continue NovoLog sliding scale, monitor CBG, continue diabetic diet Held home medications for now   # Tingling sensation in the toes and bilateral hands and cramps Unknown cause Continue as needed medication for pain control Folate 14.6 and B12 level 582 within normal range  vitamin D level 15 low, repleted. 4/30 tingling sensation almost resolved, still having some cramps, overall feels improvement.  # GERD and dyspepsia Continue pantoprazole Started Maalox  # Constipation Started MiraLAX twice daily, Dulcolax x 1 dose given Started Dulcolax as needed 4/28 no BM yet, Dulcolax suppository one-time dose ordered and mineral oil enema x 1 ordered as well if no BM after suppository 4/29 mineral oil enema ordered today, it was not given yesterday Dulcolax nightly scheduled order placed 4/30 mineral oil enema ordered,   # Abscess vs boil right vulvar region Patient was feeling a bump in the right vulvar area Started doxycycline 100 mg p.o. twice daily for 7 days  # Vitamin D deficiency: started vitamin D 50,000 units p.o. weekly, follow with PCP to repeat vitamin D level after  3 to 6 months.  Allergic conjunctivitis, patient was complaining of  itching of eyes and some redness Started ketotifen eyedrops twice daily and fresh tears   Body mass index is 36.51 kg/m.  Interventions:       Diet: Heart healthy/carb modified diet DVT Prophylaxis: Subcutaneous Heparin    Advance goals of care discussion: Full code  Family Communication: family was not present at bedside, at the time of interview.  The pt provided permission to discuss medical plan with the family. Opportunity was given to ask question and all questions were answered satisfactorily.   Disposition:  Pt is from Home, admitted with SOB, constipation, abdominal bloating, still has constipation, insomnia, headache, itching of eyes nasal congestion,  which precludes a safe discharge. Discharge to home , when stable.  Most likely tomorrow a.m.  Subjective: No significant events overnight, patient still feels bloated, had a small amount of BM yesterday after enema, not very satisfied, still requesting another enema.  Itching in the bilateral eyes improved, boils in the vaginal area improving We will give her another dose of Medrol and edema and plan for disposition tomorrow a.m.   Physical Exam: General: NAD, lying comfortably Appear in no distress, affect appropriate Eyes: PERRLA ENT: Oral Mucosa Clear, moist  Neck: no JVD,  Cardiovascular: S1 and S2 Present, no Murmur,  Respiratory: good respiratory effort, Bilateral Air entry equal and Decreased, no Crackles, no wheezes Abdomen: Bowel Sound present, Soft and no tenderness, boils on the right vulvar region Skin: no rashes Extremities: no Pedal edema, no calf tenderness Neurologic: without any new focal findings Gait not checked due to patient safety concerns  Vitals:   02/01/23 1731 02/02/23 0010 02/02/23 0500 02/02/23 0820  BP: (!) 147/120 (!) 150/89  (!) 104/92  Pulse: 79 90  91  Resp: 18 18  18   Temp: 97.8 F (36.6 C) 98.3 F (36.8 C)  98.2 F (36.8 C)  TempSrc:      SpO2: 99% 93%  94%  Weight:   97.8  kg   Height:        Intake/Output Summary (Last 24 hours) at 02/02/2023 1536 Last data filed at 02/02/2023 1050 Gross per 24 hour  Intake 123 ml  Output --  Net 123 ml   Filed Weights   01/30/23 0500 02/01/23 0500 02/02/23 0500  Weight: 93.5 kg 95.6 kg 97.8 kg    Data Reviewed: I have personally reviewed and interpreted daily labs, tele strips, imagings as discussed above. I reviewed all nursing notes, pharmacy notes, vitals, pertinent old records I have discussed plan of care as described above with RN and patient/family.  CBC: Recent Labs  Lab 01/29/23 2221 01/30/23 0833 01/31/23 0526 02/01/23 0415 02/02/23 0611  WBC 7.3 5.6 6.1 6.4 6.8  HGB 15.2* 14.9 14.5 14.1 14.7  HCT 46.0 45.4 44.7 43.5 44.6  MCV 105.3* 104.8* 104.0* 104.6* 103.5*  PLT 236 223 214 209 207   Basic Metabolic Panel: Recent Labs  Lab 01/29/23 1741 01/29/23 2221 01/30/23 0833 01/31/23 0526 02/01/23 0415 02/02/23 0611  NA 136  --  137 134* 134* 135  K 3.6  --  3.6 3.7 3.6 3.8  CL 89*  --  89* 85* 86* 90*  CO2 34*  --  36* 37* 38* 34*  GLUCOSE 180*  --  187* 149* 165* 162*  BUN 24*  --  28* 31* 37* 31*  CREATININE 1.26* 1.18* 1.33* 1.33* 1.19* 1.17*  CALCIUM 9.1  --  8.8* 9.0 9.6 8.8*  MG  --   --  1.6* 2.2 1.9 1.5*  PHOS  --   --  4.7* 5.3* 4.7* 4.2    Studies: No results found.  Scheduled Meds:  bisacodyl  10 mg Oral QHS   doxycycline  100 mg Oral Q12H   heparin  5,000 Units Subcutaneous Q8H   insulin aspart  0-15 Units Subcutaneous TID WC   ketotifen  1 drop Both Eyes BID   loratadine  10 mg Oral Daily   melatonin  5 mg Oral QHS   metoprolol succinate  25 mg Oral Daily   mineral oil  1 enema Rectal Once   multivitamin with minerals  1 tablet Oral Daily   nicotine  14 mg Transdermal Daily   pantoprazole  40 mg Oral BID   polyethylene glycol  17 g Oral BID   polyvinyl alcohol  2 drop Both Eyes TID   sodium chloride flush  3 mL Intravenous Q12H   traZODone  50 mg Oral QHS    Vitamin D (Ergocalciferol)  50,000 Units Oral Q7 days   Continuous Infusions: PRN Meds: acetaminophen **OR** acetaminophen, albuterol, bisacodyl, bisacodyl, hydrALAZINE, mouth rinse, oxyCODONE, polyvinyl alcohol **FOLLOWED BY** [START ON 02/03/2023] polyvinyl alcohol  Time spent: 35 minutes  Author: Gillis Santa. MD [facility] 02/02/2023 3:36 PM  To reach On-call, see care teams to locate the attending and reach out to them via [email or web address]. If 7PM-7AM, please contact night-coverage If you still have difficulty reaching the attending provider, please page the [facility] (Director on Call) for [facility] on amion for assistance.

## 2023-02-03 DIAGNOSIS — R0602 Shortness of breath: Secondary | ICD-10-CM | POA: Diagnosis not present

## 2023-02-03 LAB — GLUCOSE, CAPILLARY
Glucose-Capillary: 162 mg/dL — ABNORMAL HIGH (ref 70–99)
Glucose-Capillary: 205 mg/dL — ABNORMAL HIGH (ref 70–99)

## 2023-02-03 LAB — CBC
HCT: 43.7 % (ref 36.0–46.0)
Hemoglobin: 14.2 g/dL (ref 12.0–15.0)
MCH: 34.1 pg — ABNORMAL HIGH (ref 26.0–34.0)
MCHC: 32.5 g/dL (ref 30.0–36.0)
MCV: 105 fL — ABNORMAL HIGH (ref 80.0–100.0)
Platelets: 187 10*3/uL (ref 150–400)
RBC: 4.16 MIL/uL (ref 3.87–5.11)
RDW: 13 % (ref 11.5–15.5)
WBC: 5.7 10*3/uL (ref 4.0–10.5)
nRBC: 0 % (ref 0.0–0.2)

## 2023-02-03 LAB — BASIC METABOLIC PANEL
Anion gap: 9 (ref 5–15)
BUN: 31 mg/dL — ABNORMAL HIGH (ref 8–23)
CO2: 35 mmol/L — ABNORMAL HIGH (ref 22–32)
Calcium: 9.4 mg/dL (ref 8.9–10.3)
Chloride: 92 mmol/L — ABNORMAL LOW (ref 98–111)
Creatinine, Ser: 1.2 mg/dL — ABNORMAL HIGH (ref 0.44–1.00)
GFR, Estimated: 51 mL/min — ABNORMAL LOW (ref >60)
Glucose, Bld: 155 mg/dL — ABNORMAL HIGH (ref 70–99)
Potassium: 4.2 mmol/L (ref 3.5–5.1)
Sodium: 136 mmol/L (ref 135–145)

## 2023-02-03 LAB — PHOSPHORUS: Phosphorus: 4.8 mg/dL — ABNORMAL HIGH (ref 2.5–4.6)

## 2023-02-03 LAB — MAGNESIUM: Magnesium: 1.8 mg/dL (ref 1.7–2.4)

## 2023-02-03 MED ORDER — POLYVINYL ALCOHOL 1.4 % OP SOLN: 2.0000 [drp] | OPHTHALMIC | 0 refills | Status: AC | PRN

## 2023-02-03 MED ORDER — VITAMIN D (ERGOCALCIFEROL) 1.25 MG (50000 UNIT) PO CAPS: 50000.0000 [IU] | ORAL_CAPSULE | ORAL | 0 refills | Status: AC

## 2023-02-03 MED ORDER — NICOTINE 14 MG/24HR TD PT24: 14.0000 mg | MEDICATED_PATCH | Freq: Every day | TRANSDERMAL | 0 refills | Status: AC

## 2023-02-03 MED ORDER — DOXYCYCLINE HYCLATE 100 MG PO TABS: 100.0000 mg | ORAL_TABLET | Freq: Two times a day (BID) | ORAL | 0 refills | Status: AC

## 2023-02-03 MED ORDER — POLYETHYLENE GLYCOL 3350 17 G PO PACK: 17.0000 g | PACK | Freq: Two times a day (BID) | ORAL | 0 refills | Status: AC

## 2023-02-03 MED ORDER — KETOTIFEN FUMARATE 0.035 % OP SOLN: 1.0000 [drp] | Freq: Two times a day (BID) | OPHTHALMIC | 0 refills | Status: AC

## 2023-02-03 MED ORDER — TRAZODONE HCL 50 MG PO TABS: 50.0000 mg | ORAL_TABLET | Freq: Every evening | ORAL | 0 refills | Status: AC | PRN

## 2023-02-03 MED ORDER — MELATONIN 5 MG PO TABS: 5.0000 mg | ORAL_TABLET | Freq: Every day | ORAL | 2 refills | Status: AC

## 2023-02-03 MED ORDER — BISACODYL 5 MG PO TBEC: 10.0000 mg | DELAYED_RELEASE_TABLET | Freq: Every day | ORAL | 0 refills | Status: AC | PRN

## 2023-02-03 MED ORDER — ADULT MULTIVITAMIN W/MINERALS CH: 1.0000 | ORAL_TABLET | Freq: Every day | ORAL | 2 refills | Status: AC

## 2023-02-03 NOTE — Discharge Summary (Signed)
[facility] Discharge Summary   Patient: Jaime Fitzgerald VHQ:[id number]  PCP: [facility]  Date of admission: 01/29/2023   Date of discharge:  02/03/2023     Discharge Diagnoses:  Principal Problem:   SOB (shortness of breath) Active Problems:   Hypertension   AKI (acute kidney injury) (HCC)   Type 2 diabetes mellitus (HCC)   Seizures (HCC)   History of gastric ulcer with hemorrhage   Admitted From: Home Disposition:  Home   Recommendations for Outpatient Follow-up:  Follow-up with PCP in 1 week, repeat BMP in 1 to 2 weeks to check renal functions.  Follow up LABS/TEST:  BMP in 1 week   Diet recommendation: Cardiac and Carb modified diet  Activity: The patient is advised to gradually reintroduce usual activities, as tolerated  Discharge Condition: stable  Code Status: Full code   History of present illness: As per the H and P dictated on admission Hospital Course:  Jaime Fitzgerald is a 62 y.o. female with h/o NIDDM T2, HTN, HLD, asthma/COPD quit smoking, denies vaping,, GI bleed, seizure disorder, osteoarthritis, sleep apnea, as reviewed from EMR, presented to [facility] ED with complaining of shortness of breath.  Patient is having difficulty breathing for past 3 weeks, severe orthopnea, complaining of abdominal distention and lower extremity edema.  Patient denies any chest pain or palpitations.  Patient was complaining of tingling in her fourth and fifth toes and cramping in bilateral hands and off-and-on numbness.  Patient was on gabapentin which was discontinued by her PCP.  Recently started on metolazone and since then she started having cramps in her extremities. Patient's is cardiologist Dr.Arida.   ED workup vital signs stable, BNP creatinine 1.18, blood glucose 180 slightly elevated Troponin x 1 negative, BNP 4.6 within normal range, CBC macrocytosis, elevated MCV, no any other significant findings.  HbA1c 7.6 well-controlled CXR: Cardiomegaly.  Central pulmonary vessels are prominent suggesting CHF. Increased interstitial markings are seen in parahilar regions and lower lung fields suggesting interstitial pulmonary edema. Possible small left pleural effusion. Abdominal x-ray negative for any acute findings   Assessment and Plan:   # Acute on chronic hypoxic respiratory failure presented with shortness of breath, dyspnea on exertion and orthopnea could be secondary to pulmonary congestion. Patient has history of asthma/COPD, past history of smoking, currently vaping. BNP 4.6, troponin negative. CXR shows mild pulmonary congestion. S/p Lasix 40 mg IV, discontinued due to elevated BUN and creatinine. Continue albuterol as needed. Continue supplemental O2 admission, currently patient is at her baseline. TTE LVEF 60 to 65%, grade 1 diastolic dysfunction, no any other significant abnormalities.   # CKD stage IIIa, baseline creatinine 1.11 on 07/23/2022 4/28 creatinine 1.33 elevated, Cr 1.17--1.2 improved. US renal normal study, urinary bladder Appears normal for degree of bladder distention. bladder scan ruled out urinary retention.  Continue oral hydration, repeat BMP after 1 week and follow with PCP for further management. # Hypomagnesemia, mag repleted. Resolved # HTN, HLD: Continue Toprol-XL 25 mg p.o. daily with holding parameters.  Blood pressure remained soft.  Discontinued amlodipine, Lasix and metolazone.  Patient was advised to monitor BP at home and follow with PCP.  Patient does not take pravastatin.  Lipid profile and further management as an outpatient. # NIDDM T2, hemoglobin A1c 7.6, well-controlled. S/p NovoLog sliding scale.patient is not on any medication at home.  Continue diabetic diet and follow with PCP for further management.   # Tingling sensation in the toes and bilateral hands and cramps  Unknown cause but resolved. Folate 14.6 and B12 level 582 within normal range  vitamin D level 15 low, repleted.  # GERD and dyspepsia,  Continue pantoprazole, s/p Maalox # Constipation, finally resolved. Started MiraLAX twice daily, Dulcolax x 1 dose given. Started Dulcolax as needed.  Patient received Dulcolax suppository on 4/28, received mineral enema on 4/29 and another enema on 4/30 which worked well.  Constipation resolved. # Abscess vs boil right vulvar region, Patient was feeling a bump in the right vulvar area. Started doxycycline 100 mg p.o. twice daily for 7 days # Vitamin D deficiency: started vitamin D 50,000 units p.o. weekly, follow with PCP to repeat vitamin D level after 3 to 6 months. # Allergic conjunctivitis, patient was complaining of itching of eyes and some redness. Started ketotifen eyedrops twice daily and fresh tears.  Follow with PCP in 1 week. # Insomnia, prescribed melatonin 5 mg p.o. nightly and trazodone nightly as needed for sleep.  Follow with PCP for further management.   Body mass index is 38.62 kg/m.  Nutrition Problem: Increased nutrient needs Etiology: chronic illness Nutrition Interventions: Interventions: MVI, Liberalize Diet  Patient was ambulatory without any assistance. On the day of the discharge the patient's vitals were stable, and no other acute medical condition were reported by patient. the patient was felt safe to be discharge at Home.  Consultants: None Procedures: None  Discharge Exam: General: Appear in no distress, no Rash; Oral Mucosa Clear, moist. Cardiovascular: S1 and S2 Present, no Murmur, Respiratory: normal respiratory effort, Bilateral Air entry present and no Crackles, no wheezes Abdomen: Bowel Sound present, Soft and no tenderness, no hernia Extremities: no Pedal edema, no calf tenderness Neurology: alert and oriented to time, place, and person affect appropriate.  Filed Weights   02/01/23 0500 02/02/23 0500 02/03/23 0500  Weight: 95.6 kg 97.8 kg 98.9 kg   Vitals:   02/03/23 0047 02/03/23 0725  BP: 130/60 121/72  Pulse: 88 89  Resp: 16 18  Temp: 98  F (36.7 C) 98 F (36.7 C)  SpO2: 95% 100%    DISCHARGE MEDICATION: Allergies as of 02/03/2023       Reactions   Aspirin Nausea Only, Other (See Comments)   GI upset   Penicillins Rash        Medication List     STOP taking these medications    amLODipine 5 MG tablet Commonly known as: NORVASC   cyclobenzaprine 10 MG tablet Commonly known as: FLEXERIL   FLOVENT IN   furosemide 40 MG tablet Commonly known as: LASIX   glipiZIDE 10 MG 24 hr tablet Commonly known as: GLUCOTROL XL   meclizine 12.5 MG tablet Commonly known as: ANTIVERT   metolazone 2.5 MG tablet Commonly known as: ZAROXOLYN   metoprolol tartrate 100 MG tablet Commonly known as: LOPRESSOR   potassium chloride SA 20 MEQ tablet Commonly known as: KLOR-CON M   pravastatin 40 MG tablet Commonly known as: PRAVACHOL   witch hazel-glycerin pad Commonly known as: TUCKS       TAKE these medications    albuterol 108 (90 Base) MCG/ACT inhaler Commonly known as: VENTOLIN HFA Inhale 2 puffs into the lungs every 6 (six) hours as needed for wheezing or shortness of breath.   bisacodyl 5 MG EC tablet Commonly known as: DULCOLAX Take 2 tablets (10 mg total) by mouth daily as needed for moderate constipation.   cyanocobalamin 1000 MCG tablet Commonly known as: VITAMIN B12 Take 1 tablet (1,000 mcg total) by mouth daily.  doxycycline 100 MG tablet Commonly known as: VIBRA-TABS Take 1 tablet (100 mg total) by mouth every 12 (twelve) hours for 5 days.   ketotifen 0.035 % ophthalmic solution Commonly known as: ZADITOR Place 1 drop into both eyes 2 (two) times daily for 7 days.   melatonin 5 MG Tabs Take 1 tablet (5 mg total) by mouth at bedtime.   metoprolol succinate 25 MG 24 hr tablet Commonly known as: TOPROL-XL TAKE ONE TABLET BY MOUTH EVERY DAY   multivitamin with minerals Tabs tablet Take 1 tablet by mouth daily.   nicotine 14 mg/24hr patch Commonly known as: NICODERM CQ - dosed in  mg/24 hours Place 1 patch (14 mg total) onto the skin daily. Start taking on: Feb 04, 2023   pantoprazole 40 MG tablet Commonly known as: PROTONIX Take 1 tablet (40 mg total) by mouth daily.   polyethylene glycol 17 g packet Commonly known as: MIRALAX / GLYCOLAX Take 17 g by mouth 2 (two) times daily.   polyvinyl alcohol 1.4 % ophthalmic solution Commonly known as: LIQUIFILM TEARS Place 2 drops into both eyes as needed for dry eyes.   traZODone 50 MG tablet Commonly known as: DESYREL Take 1 tablet (50 mg total) by mouth at bedtime as needed for sleep.   Vitamin D (Ergocalciferol) 1.25 MG (50000 UNIT) Caps capsule Commonly known as: DRISDOL Take 1 capsule (50,000 Units total) by mouth every 7 (seven) days. Start taking on: Feb 07, 2023               Durable Medical Equipment  (From admission, onward)           Start     Ordered   02/01/23 1451  For home use only DME Walker rolling  Once       Comments: 2 wheels  Question Answer Comment  Walker: With 5 Inch Wheels   Patient needs a walker to treat with the following condition Ambulatory dysfunction      02/01/23 1450   02/01/23 1450  For home use only DME 3 n 1  Once        02/01/23 1449           Allergies  Allergen Reactions   Aspirin Nausea Only and Other (See Comments)    GI upset   Penicillins Rash   Discharge Instructions     Call MD for:  difficulty breathing, headache or visual disturbances   Complete by: As directed    Call MD for:  extreme fatigue   Complete by: As directed    Call MD for:  persistant dizziness or light-headedness   Complete by: As directed    Call MD for:  severe uncontrolled pain   Complete by: As directed    Call MD for:  temperature >100.4   Complete by: As directed    Diet - low sodium heart healthy   Complete by: As directed    Discharge instructions   Complete by: As directed    Follow-up with PCP in 1 week, repeat BMP in 1 to 2 weeks to check renal functions.    Increase activity slowly   Complete by: As directed        The results of significant diagnostics from this hospitalization (including imaging, microbiology, ancillary and laboratory) are listed below for reference.    Significant Diagnostic Studies: ECHOCARDIOGRAM COMPLETE  Result Date: 01/30/2023    ECHOCARDIOGRAM REPORT   Patient Name:   TYMARA SAUR Date of Exam: 01/30/2023 Medical  Rec #:  [id number]      Height:       63.0 in Accession #:    [id number]     Weight:       206.1 lb Date of Birth:  Jun 12, 1961      BSA:          1.959 m Patient Age:    62 years       BP:           134/86 mmHg Patient Gender: F              HR:           83 bpm. Exam Location:  [place] Procedure: 2D Echo, Cardiac Doppler, Color Doppler and Intracardiac            Opacification Agent Indications:    CHF-Acute Systolic 428.21 / I50.21  History:        Patient has prior history of Echocardiogram examinations, most                 recent 11/26/2021. Acute respiratory failure with hypoxia,                 Signs/Symptoms:Shortness of Breath; Risk Factors:Diabetes.  Sonographer:    Louie Boston RDCS Referring Phys: [phone] EKTA V PATEL  Sonographer Comments: Suboptimal apical window. IMPRESSIONS  1. Left ventricular ejection fraction, by estimation, is 60 to 65%. The left ventricle has normal function. The left ventricle has no regional wall motion abnormalities. There is mild left ventricular hypertrophy. Left ventricular diastolic parameters are consistent with Grade I diastolic dysfunction (impaired relaxation).  2. Right ventricular systolic function is normal. The right ventricular size is normal. There is normal pulmonary artery systolic pressure. The estimated right ventricular systolic pressure is 26.3 mmHg.  3. The mitral valve is normal in structure. No evidence of mitral valve regurgitation. No evidence of mitral stenosis.  4. The aortic valve has an indeterminant number of cusps. Aortic valve regurgitation is not  visualized. No aortic stenosis is present.  5. The inferior vena cava is normal in size with greater than 50% respiratory variability, suggesting right atrial pressure of 3 mmHg. FINDINGS  Left Ventricle: Left ventricular ejection fraction, by estimation, is 60 to 65%. The left ventricle has normal function. The left ventricle has no regional wall motion abnormalities. Definity contrast agent was given IV to delineate the left ventricular  endocardial borders. The left ventricular internal cavity size was normal in size. There is mild left ventricular hypertrophy. Left ventricular diastolic parameters are consistent with Grade I diastolic dysfunction (impaired relaxation). Right Ventricle: The right ventricular size is normal. No increase in right ventricular wall thickness. Right ventricular systolic function is normal. There is normal pulmonary artery systolic pressure. The tricuspid regurgitant velocity is 2.31 m/s, and  with an assumed right atrial pressure of 5 mmHg, the estimated right ventricular systolic pressure is 26.3 mmHg. Left Atrium: Left atrial size was normal in size. Right Atrium: Right atrial size was normal in size. Pericardium: There is no evidence of pericardial effusion. Mitral Valve: The mitral valve is normal in structure. No evidence of mitral valve regurgitation. No evidence of mitral valve stenosis. Tricuspid Valve: The tricuspid valve is normal in structure. Tricuspid valve regurgitation is not demonstrated. No evidence of tricuspid stenosis. Aortic Valve: The aortic valve has an indeterminant number of cusps. Aortic valve regurgitation is not visualized. No aortic stenosis is present. Pulmonic Valve: The pulmonic valve was normal in structure. Pulmonic valve  regurgitation is trivial. No evidence of pulmonic stenosis. Aorta: The aortic root is normal in size and structure. Venous: The inferior vena cava is normal in size with greater than 50% respiratory variability, suggesting right  atrial pressure of 3 mmHg. IAS/Shunts: No atrial level shunt detected by color flow Doppler.  LEFT VENTRICLE PLAX 2D LVIDd:         4.00 cm   Diastology LVIDs:         2.90 cm   LV e' medial:    5.66 cm/s LV PW:         1.30 cm   LV E/e' medial:  10.0 LV IVS:        1.30 cm   LV e' lateral:   6.42 cm/s LVOT diam:     2.00 cm   LV E/e' lateral: 8.8 LV SV:         59 LV SV Index:   30 LVOT Area:     3.14 cm  RIGHT VENTRICLE             IVC RV S prime:     10.10 cm/s  IVC diam: 1.21 cm LEFT ATRIUM           Index        RIGHT ATRIUM           Index LA diam:      3.60 cm 1.84 cm/m   RA Area:     10.60 cm LA Vol (A2C): 30.1 ml 15.37 ml/m  RA Volume:   21.70 ml  11.08 ml/m LA Vol (A4C): 30.4 ml 15.52 ml/m  AORTIC VALVE LVOT Vmax:   112.50 cm/s LVOT Vmean:  70.450 cm/s LVOT VTI:    0.186 m  AORTA Ao Root diam: 2.70 cm Ao Asc diam:  2.80 cm Ao Desc diam: 2.30 cm MITRAL VALVE               TRICUSPID VALVE MV Area (PHT): 2.79 cm    TR Peak grad:   21.3 mmHg MV Decel Time: 272 msec    TR Vmax:        231.00 cm/s MV E velocity: 56.60 cm/s MV A velocity: 84.40 cm/s  SHUNTS MV E/A ratio:  0.67        Systemic VTI:  0.19 m                            Systemic Diam: 2.00 cm Julien Nordmann MD Electronically signed by Julien Nordmann MD Signature Date/Time: 01/30/2023/5:08:27 PM    Final    US RENAL  Result Date: 01/30/2023 CLINICAL DATA:  Chronic renal disease EXAM: RENAL / URINARY TRACT ULTRASOUND COMPLETE COMPARISON:  None Available. FINDINGS: Right Kidney: Renal measurements: 9.3 x 4.6 x 5.3 cm = volume: 116 mL. Echogenicity within normal limits. No mass or hydronephrosis visualized. Left Kidney: Renal measurements: 9.3 x 5.4 x 5.0 cm = volume: 133 mL. Echogenicity within normal limits. No mass or hydronephrosis visualized. Bladder: Appears normal for degree of bladder distention. Other: None. IMPRESSION: Normal study. Electronically Signed   By: Gerome Sam III M.D.   On: 01/30/2023 15:08   DG Abd 1 View  Result  Date: 01/30/2023 CLINICAL DATA:  Abdominal pain EXAM: ABDOMEN - 1 VIEW COMPARISON:  None Available. FINDINGS: The bowel gas pattern is normal. No radio-opaque calculi or other significant radiographic abnormality are seen. IMPRESSION: Negative. Electronically Signed   By: Charlaine Dalton.D.  On: 01/30/2023 11:04   DG Chest 2 View  Result Date: 01/29/2023 CLINICAL DATA:  Difficulty breathing, edema in lower extremities EXAM: CHEST - 2 VIEW COMPARISON:  10/13/2021 FINDINGS: Transverse diameter of heart is increased. Central pulmonary vessels are more prominent. Increased interstitial markings are seen in parahilar regions and lower lung fields on both sides. There is blunting of left lateral CP angle. There is no pneumothorax. IMPRESSION: Cardiomegaly. Central pulmonary vessels are prominent suggesting CHF. Increased interstitial markings are seen in parahilar regions and lower lung fields suggesting interstitial pulmonary edema. Possible small left pleural effusion. Electronically Signed   By: Ernie Avena M.D.   On: 01/29/2023 18:02    Microbiology: No results found for this or any previous visit (from the past 240 hour(s)).   Labs: CBC: Recent Labs  Lab 01/30/23 0833 01/31/23 0526 02/01/23 0415 02/02/23 0611 02/03/23 0612  WBC 5.6 6.1 6.4 6.8 5.7  HGB 14.9 14.5 14.1 14.7 14.2  HCT 45.4 44.7 43.5 44.6 43.7  MCV 104.8* 104.0* 104.6* 103.5* 105.0*  PLT 223 214 209 207 187   Basic Metabolic Panel: Recent Labs  Lab 01/30/23 0833 01/31/23 0526 02/01/23 0415 02/02/23 0611 02/03/23 0612  NA 137 134* 134* 135 136  K 3.6 3.7 3.6 3.8 4.2  CL 89* 85* 86* 90* 92*  CO2 36* 37* 38* 34* 35*  GLUCOSE 187* 149* 165* 162* 155*  BUN 28* 31* 37* 31* 31*  CREATININE 1.33* 1.33* 1.19* 1.17* 1.20*  CALCIUM 8.8* 9.0 9.6 8.8* 9.4  MG 1.6* 2.2 1.9 1.5* 1.8  PHOS 4.7* 5.3* 4.7* 4.2 4.8*   Liver Function Tests: Recent Labs  Lab 01/29/23 1741  AST 20  ALT 14  ALKPHOS 62  BILITOT 0.5   PROT 8.0  ALBUMIN 4.1   No results for input(s): "LIPASE", "AMYLASE" in the last 168 hours. No results for input(s): "AMMONIA" in the last 168 hours. Cardiac Enzymes: No results for input(s): "CKTOTAL", "CKMB", "CKMBINDEX", "TROPONINI" in the last 168 hours. BNP (last 3 results) Recent Labs    07/23/22 1504 01/12/23 1514 01/29/23 1741  BNP 4.5 19.8 4.6   CBG: Recent Labs  Lab 02/02/23 0802 02/02/23 1124 02/02/23 1623 02/02/23 2207 02/03/23 0727  GLUCAP 145* 201* 114* 156* 162*    Time spent: 35 minutes  Signed:  Gillis Santa  [facility] 02/03/2023 11:34 AM

## 2023-02-03 NOTE — TOC Progression Note (Addendum)
Transition of Care [facility]) - Progression Note    Patient Details  Name: Jaime Fitzgerald MRN: [id number] Date of Birth: July 17, 1961  [facility]) CM/SW Contact  Marlowe Sax, RN Phone Number: 02/03/2023, 1:23 PM  Clinical Narrative:     Arranged Safe Transport to door to door, they will call nurses' desk upon arrival, will come in less than 45 min   Expected Discharge Plan: Home/Self Care Barriers to Discharge: No Home Care Agency will accept this patient  Expected Discharge Plan and Services     Post Acute Care Choice: Home Health Living arrangements for the past 2 months: Single Family Home Expected Discharge Date: 02/03/23                                     Social Determinants of Health (SDOH) Interventions SDOH Screenings   Food Insecurity: No Food Insecurity (01/29/2023)  Housing: Low Risk  (01/29/2023)  Transportation Needs: Unmet Transportation Needs (01/29/2023)  Utilities: Not At Risk (01/29/2023)  Depression (PHQ2-9): Low Risk  (07/07/2022)  Tobacco Use: Medium Risk (01/30/2023)    Readmission Risk Interventions     No data to display

## 2023-02-03 NOTE — TOC Progression Note (Signed)
Transition of Care [facility]) - Progression Note    Patient Details  Name: Jaime Fitzgerald MRN: [id number] Date of Birth: [birth year]  Transition of Care [facility]) CM/SW Contact  Marlowe Sax, RN Phone Number: 02/03/2023, 10:38 AM  Clinical Narrative:    The patient has a Hospital Bed and Manual WC at home also has a RW She will not be getting Home health services due to her Medicaid and no agency will accept her  Expected Discharge Plan: Home/Self Care Barriers to Discharge: No Home Care Agency will accept this patient  Expected Discharge Plan and Services     Post Acute Care Choice: Home Health Living arrangements for the past 2 months: Single Family Home                                       Social Determinants of Health (SDOH) Interventions SDOH Screenings   Food Insecurity: No Food Insecurity (01/29/2023)  Housing: Low Risk  (01/29/2023)  Transportation Needs: Unmet Transportation Needs (01/29/2023)  Utilities: Not At Risk (01/29/2023)  Depression (PHQ2-9): Low Risk  (07/07/2022)  Tobacco Use: Medium Risk (01/30/2023)    Readmission Risk Interventions     No data to display

## 2023-02-03 NOTE — Progress Notes (Signed)
Mobility Specialist - Progress Note   02/03/23 1029  Mobility  Activity Ambulated with assistance in room;Ambulated with assistance to bathroom;Stood at bedside;Dangled on edge of bed  Level of Assistance Contact guard assist, steadying assist  Assistive Device None  Distance Ambulated (ft) 10 ft  Activity Response Tolerated well  Mobility Referral Yes  $Mobility charge 1 Mobility   Pt supine in bed on RA upon arrival. Pt completes bed mobility indep. Pt STS and ambulates to/from bathroom SBA with no LOB noted but pt furniture surfs throughout mobility. Pt defers RW during session. Pt left in recliner with needs in reach and RN in room.   Terrilyn Saver  Mobility Specialist  02/03/23 10:31 AM

## 2023-02-03 NOTE — Progress Notes (Signed)
Discharged. AVS printed and reviewed with patient. Patient on 2L, [place] Refused to keep  on Nasal cannula while here. Per patient she only wear oxygen at night if needed. Safe transport called for transportation home .

## 2023-02-26 ENCOUNTER — Telehealth: Payer: Self-pay | Admitting: Medical

## 2023-02-26 MED ORDER — METOPROLOL SUCCINATE ER 25 MG PO TB24: 25.0000 mg | ORAL_TABLET | Freq: Every day | ORAL | 1 refills | Status: DC

## 2023-02-26 NOTE — Telephone Encounter (Signed)
*  STAT* If patient is at the pharmacy, call can be transferred to refill team.   1. Which medications need to be refilled? (please list name of each medication and dose if known)   metoprolol succinate (TOPROL-XL) 25 MG 24 hr tablet    2. Which pharmacy/location (including street and city if local pharmacy) is medication to be sent to?  [id number] - [place], [place] - [address]    3. Do they need a 30 day or 90 day supply? 90

## 2023-02-26 NOTE — Telephone Encounter (Signed)
Good Morning,   Could you schedule this patient a follow up appointment? The patient was last seen by Cadence on 08-31-22. Thank you so much.

## 2023-02-26 NOTE — Telephone Encounter (Signed)
Pt's medication was sent to pt's pharmacy as requested. Confirmation received.  °

## 2023-02-26 NOTE — Telephone Encounter (Signed)
Patient still needs to schedule f/u for further refills.  Thank you!

## 2023-02-26 NOTE — Telephone Encounter (Signed)
Confirmed with pharmacy that the requested medication has a refill and is scheduled to ship out next week with other meds.

## 2023-03-09 NOTE — Telephone Encounter (Signed)
Pt scheduled on 7/3

## 2023-04-07 ENCOUNTER — Ambulatory Visit: Payer: 59 | Attending: Medical | Admitting: Medical

## 2023-04-07 NOTE — Progress Notes (Deleted)
Cardiology Office Note:    Date:  04/07/2023   ID:  Jaime Fitzgerald, DOB 1961-06-08, MRN [id number]  PCP:  [facility]  [facility] Cardiologist:  Lorine Bears, MD  [facility] Electrophysiologist:  None   Referring MD: [facility]   Chief Complaint: ***  History of Present Illness:    Jaime Fitzgerald is a 62 y.o. female with a hx of HFmrEF, HTN, previous GIB with blood loss anemia, asthma/COPD, tobacco use, DM2, OSA and obesity who presents for 4-6 week follow-up.   She was hospitalized in January 2023 for shortness of breath. CTA showed small pericardial effusion and cardiomegaly. Echo showed LVEF 45-50%, G3DD. The patient was diuresed. She continued to be hypoxic and was sent home with 2L O2. Dr. Kirke Corin reviewed echo and felt LVEF was normal with G1DD and mild LVH.    Seen in the office 10/2021 and lasix was increased. A limited echo was ordered. This showed LVEF 60-65%, mild LVH and small pericardial effusion. She also follows with the Heart failure clinic.    The patient was seen 07/23/22 reporting sharp occasional chest pain, SOB and cough. She did report URI. A cardiac CTA was ordered, but not performed.   Last seen 08/2022 a   Past Medical History:  Diagnosis Date   Acute blood loss anemia 10/11/2019   Acute GI bleeding 10/11/2019   Anginal pain (HCC)    Arthritis    Asthma    CHF (congestive heart failure) (HCC)    Diabetes mellitus without complication (HCC)    Dyspnea    Hypertension    Seizures (HCC)    Sleep apnea     Past Surgical History:  Procedure Laterality Date   CARPAL TUNNEL RELEASE     COLONOSCOPY WITH PROPOFOL N/A 01/17/2020   Procedure: COLONOSCOPY WITH PROPOFOL;  Surgeon: Toney Reil, MD;  Location: [facility] ENDOSCOPY;  Service: Gastroenterology;  Laterality: N/A;   ESOPHAGOGASTRODUODENOSCOPY N/A 10/13/2019   Procedure: ESOPHAGOGASTRODUODENOSCOPY (EGD);  Surgeon: Pasty Spillers, MD;  Location:  [facility] ENDOSCOPY;  Service: Endoscopy;  Laterality: N/A;   ESOPHAGOGASTRODUODENOSCOPY N/A 09/18/2020   Procedure: ESOPHAGOGASTRODUODENOSCOPY (EGD);  Surgeon: Toledo, Boykin Nearing, MD;  Location: [facility] ENDOSCOPY;  Service: Gastroenterology;  Laterality: N/A;   ESOPHAGOGASTRODUODENOSCOPY (EGD) WITH PROPOFOL N/A 10/11/2019   Procedure: ESOPHAGOGASTRODUODENOSCOPY (EGD) WITH PROPOFOL;  Surgeon: Pasty Spillers, MD;  Location: [facility] ENDOSCOPY;  Service: Endoscopy;  Laterality: N/A;   ESOPHAGOGASTRODUODENOSCOPY (EGD) WITH PROPOFOL N/A 01/17/2020   Procedure: ESOPHAGOGASTRODUODENOSCOPY (EGD) WITH PROPOFOL;  Surgeon: Toney Reil, MD;  Location: [facility] ENDOSCOPY;  Service: Gastroenterology;  Laterality: N/A;   TONSILLECTOMY      Current Medications: No outpatient medications have been marked as taking for the 04/07/23 encounter (Appointment) with Fransico Michael,  H, PA-C.     Allergies:   Aspirin and Penicillins   Social History   Socioeconomic History   Marital status: Legally Separated    Spouse name: Not on file   Number of children: Not on file   Years of education: Not on file   Highest education level: Not on file  Occupational History   Not on file  Tobacco Use   Smoking status: Former    Types: Cigarettes   Smokeless tobacco: Never  Vaping Use   Vaping Use: Former  Substance and Sexual Activity   Alcohol use: Not Currently   Drug use: Not Currently   Sexual activity: Not Currently  Other Topics Concern   Not on file  Social History Narrative   Not on file   Social Determinants of Health   Financial Resource Strain: Not on file  Food Insecurity: No Food Insecurity (01/29/2023)   Hunger Vital Sign    Worried About Running Out of Food in the Last Year: Never true    Ran Out of Food in the Last Year: Never true  Transportation Needs: Unmet Transportation Needs (01/29/2023)   PRAPARE - Administrator, Civil Service (Medical): Yes    Lack of Transportation  (Non-Medical): Yes  Physical Activity: Not on file  Stress: Not on file  Social Connections: Not on file     Family History: The patient's ***family history includes Heart Problems in her mother; Seizures in her father.  ROS:   Please see the history of present illness.    *** All other systems reviewed and are negative.  EKGs/Labs/Other Studies Reviewed:    The following studies were reviewed today: ***  EKG:  EKG is *** ordered today.  The ekg ordered today demonstrates ***  Recent Labs: 01/29/2023: ALT 14; B Natriuretic Peptide 4.6 02/03/2023: BUN 31; Creatinine, Ser 1.20; Hemoglobin 14.2; Magnesium 1.8; Platelets 187; Potassium 4.2; Sodium 136  Recent Lipid Panel    Component Value Date/Time   CHOL 160 08/31/2022 1516   TRIG 205 (H) 08/31/2022 1516   HDL 59 08/31/2022 1516   CHOLHDL 2.7 08/31/2022 1516   VLDL 41 (H) 08/31/2022 1516   LDLCALC 60 08/31/2022 1516   LDLDIRECT 82 08/31/2022 1516     Risk Assessment/Calculations:   {Does this patient have ATRIAL FIBRILLATION?:[phone]}   Physical Exam:    VS:  There were no vitals taken for this visit.    Wt Readings from Last 3 Encounters:  02/03/23 218 lb 0.6 oz (98.9 kg)  01/12/23 211 lb 4 oz (95.8 kg)  08/31/22 213 lb 6.4 oz (96.8 kg)     GEN: *** Well nourished, well developed in no acute distress HEENT: Normal NECK: No JVD; No carotid bruits LYMPHATICS: No lymphadenopathy CARDIAC: ***RRR, no murmurs, rubs, gallops RESPIRATORY:  Clear to auscultation without rales, wheezing or rhonchi  ABDOMEN: Soft, non-tender, non-distended MUSCULOSKELETAL:  No edema; No deformity  SKIN: Warm and dry NEUROLOGIC:  Alert and oriented x 3 PSYCHIATRIC:  Normal affect   ASSESSMENT:    No diagnosis found. PLAN:    In order of problems listed above:  ***  Disposition: Follow up {follow up:15908} with ***   Shared Decision Making/Informed Consent   {Are you ordering a CV Procedure (e.g. stress test, cath, DCCV,  TEE, etc)?   Press F2        :[id number]}    Signed,  David Stall, PA-C  04/07/2023 8:25 AM    [facility]

## 2023-04-11 ENCOUNTER — Other Ambulatory Visit: Payer: Self-pay | Admitting: Family

## 2023-04-11 ENCOUNTER — Other Ambulatory Visit: Payer: Self-pay | Admitting: Medical

## 2023-04-28 ENCOUNTER — Other Ambulatory Visit: Payer: Self-pay | Admitting: Medical

## 2023-04-28 ENCOUNTER — Other Ambulatory Visit: Payer: Self-pay | Admitting: Family

## 2023-04-28 NOTE — Telephone Encounter (Signed)
Med.was d/c post hospital stay. Need f/u appt to reeval need for med.

## 2023-05-08 IMAGING — CT CT ANGIO CHEST
2 of 7 series · 18 of 46 positions shown · IV contrast (APPLIED)
Comparison: Chest x-ray 10/13/2021, CT chest 06/13/2021

CLINICAL DATA: Hypoxic

EXAM:
CT ANGIOGRAPHY CHEST WITH CONTRAST
TECHNIQUE: Multidetector CT imaging of the chest was performed using the
standard protocol during bolus administration of intravenous
contrast. Multiplanar CT image reconstructions and MIPs were
obtained to evaluate the vascular anatomy.
CONTRAST:  100mL OMNIPAQUE IOHEXOL 350 MG/ML SOLN

[Series 6: thins · axial · 0.79mm/px · z∈[-862,-619]mm · 15 of 338 slices shown]
[im 17/338  lung]
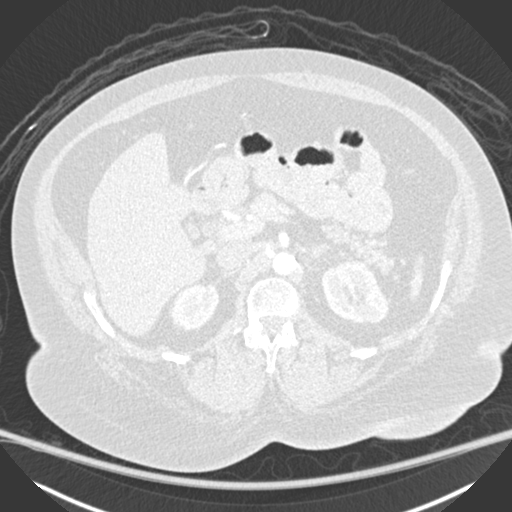
[im 34/338  soft-tissue]
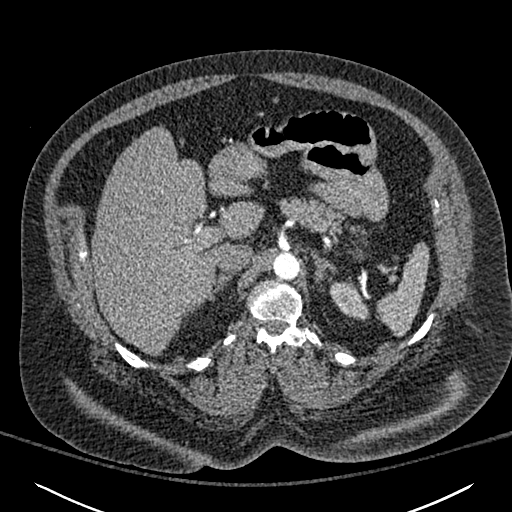
[im 68/338  lung]
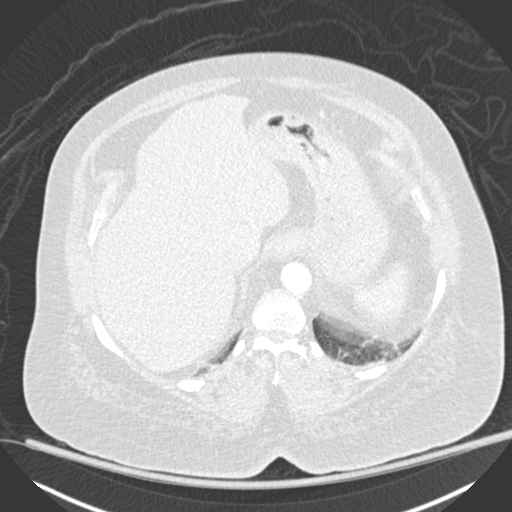
[im 85/338  soft-tissue]
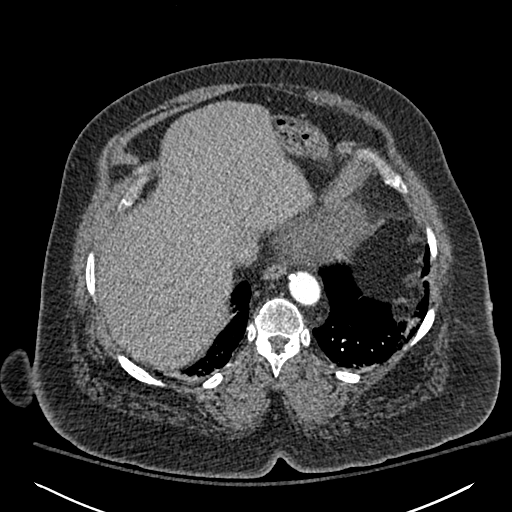
[im 102/338  lung]
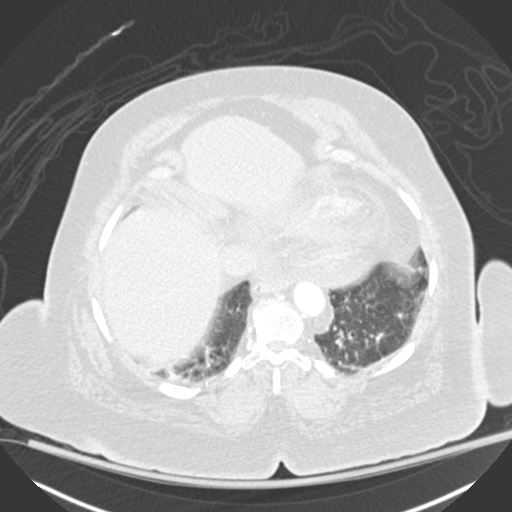
[im 118/338  soft-tissue]
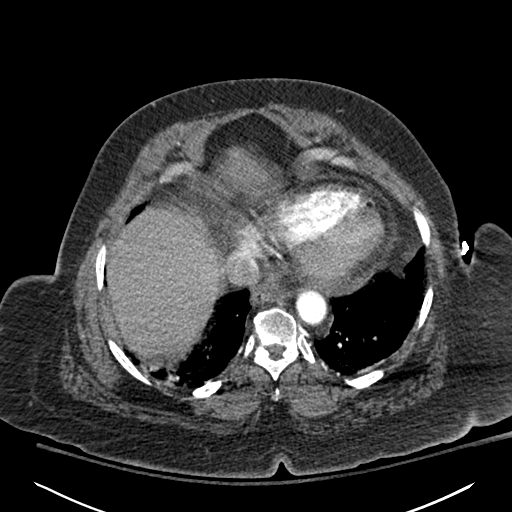
[im 152/338  lung]
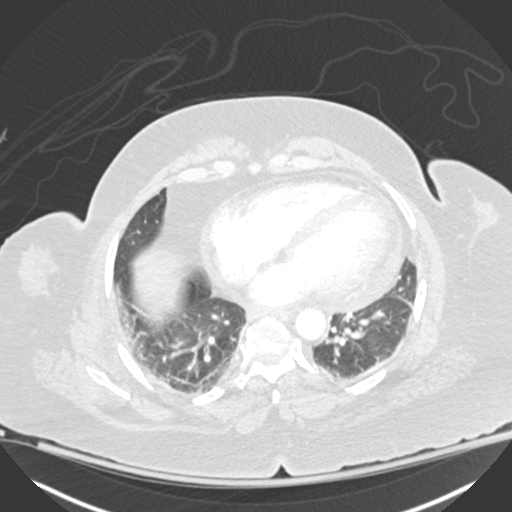
[im 169/338  soft-tissue]
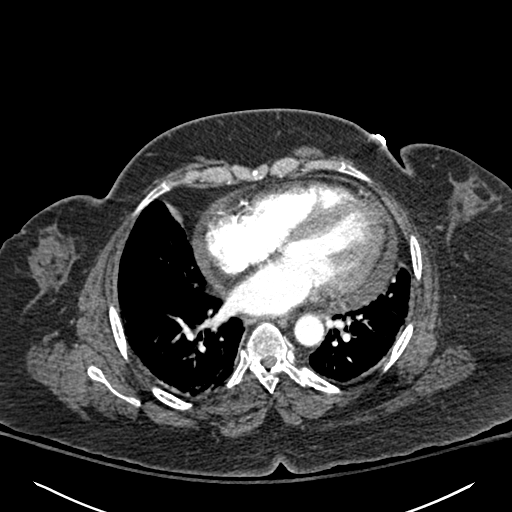
[im 186/338  lung]
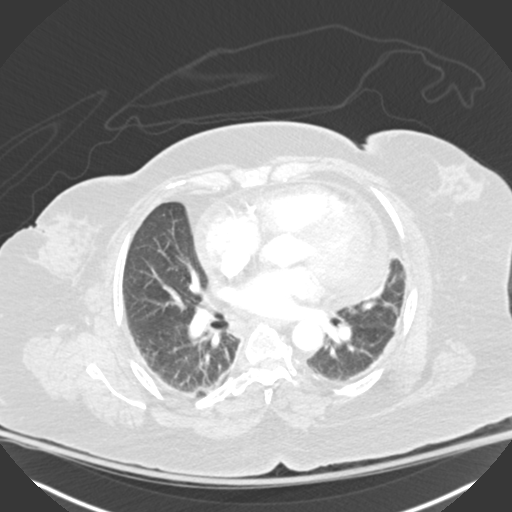
[im 220/338  soft-tissue]
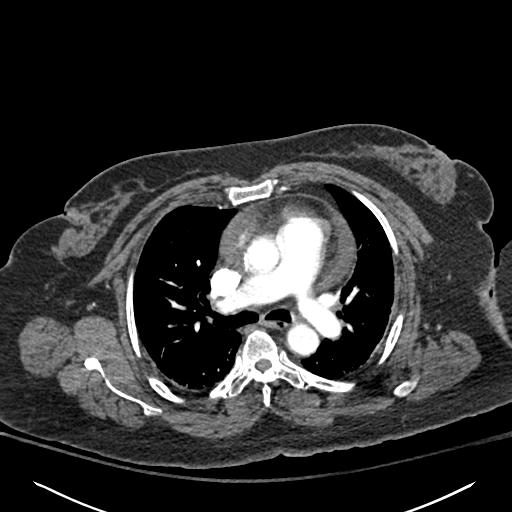
[im 236/338  lung]
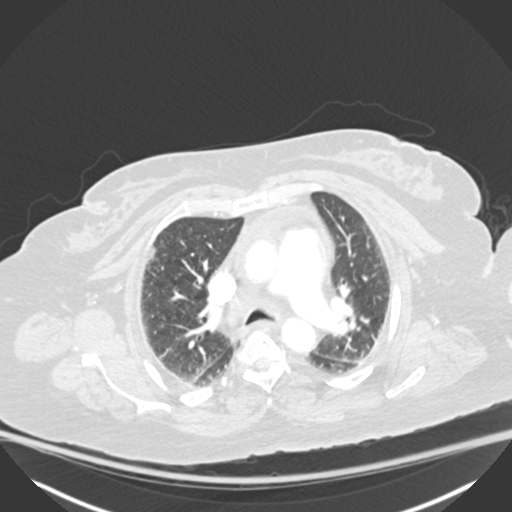
[im 253/338  soft-tissue]
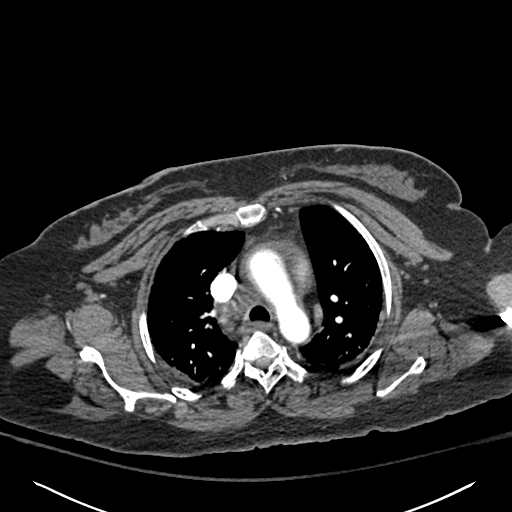
[im 270/338  lung]
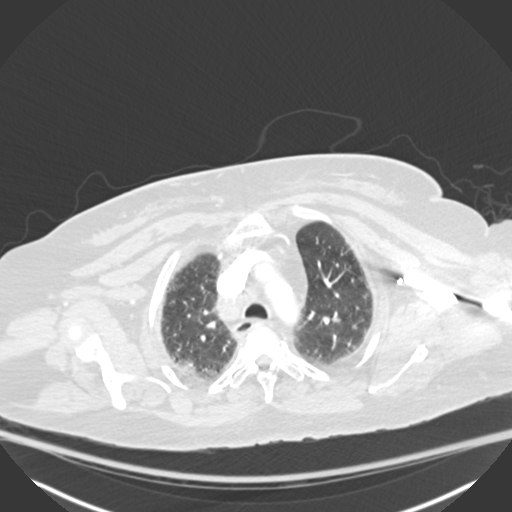
[im 304/338  soft-tissue]
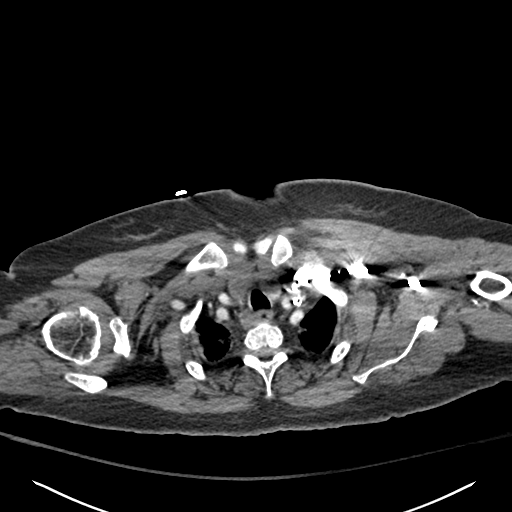
[im 321/338  lung]
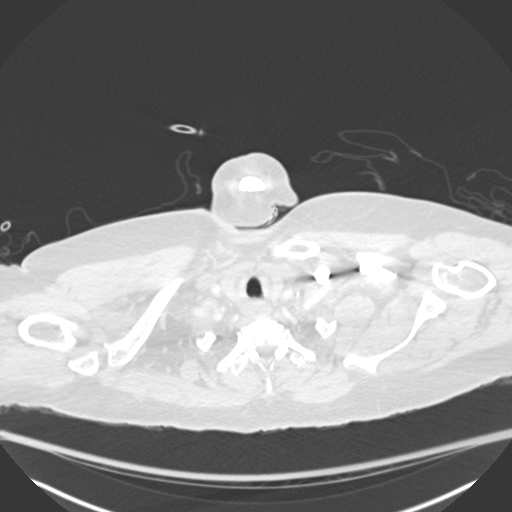

[Series 8: coronal mpr · coronal · 0.54mm/px · 3 of 106 slices shown]
[im 27/106  soft-tissue]
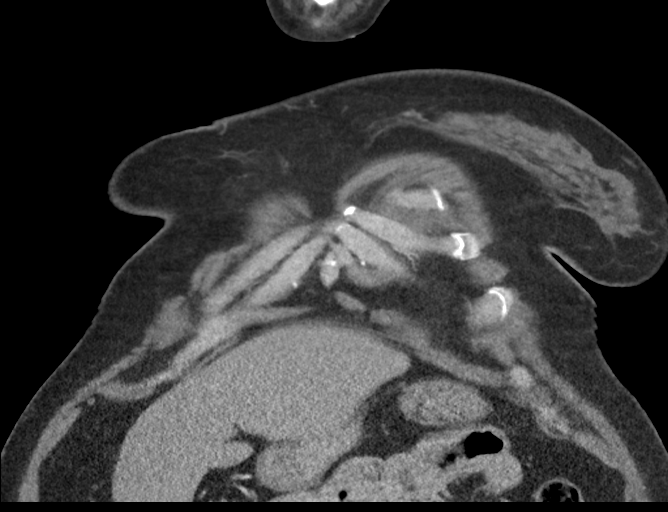
[im 53/106  soft-tissue]
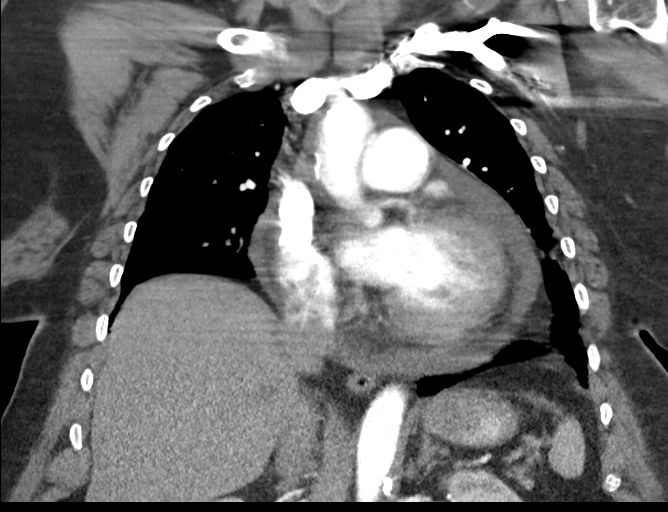
[im 79/106  soft-tissue]
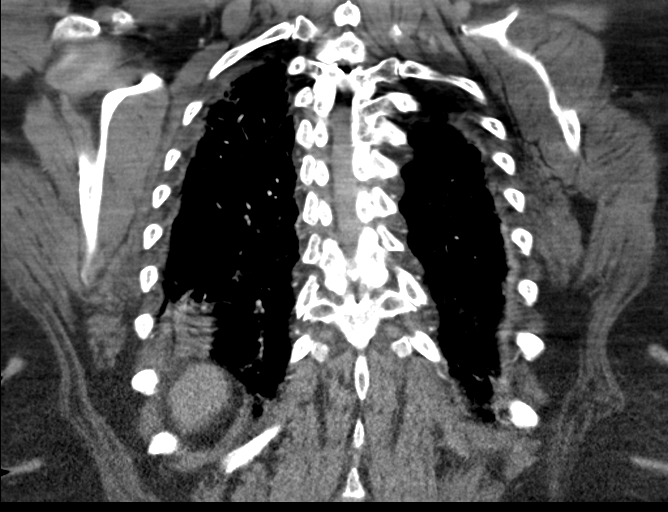

[18 of 46 positions shown; findings below may reference images not displayed]

FINDINGS: Cardiovascular: Satisfactory opacification of the pulmonary arteries
to the segmental level. No evidence of pulmonary embolism.
Nonaneurysmal aorta. Mild atherosclerosis. No dissection.
Cardiomegaly. Increased pericardial effusion, small moderate
measuring 15 mm maximum thickness left cardiac margin.

Mediastinum/Nodes: Midline trachea. No thyroid mass. Enlarged right
paratracheal lymph nodes measuring up to 17 mm without significant
change. Small right hilar lymph nodes. Esophagus normal

Lungs/Pleura: Mild subpleural reticulation and emphysema. No
consolidation, pleural effusion or pneumothorax

Upper Abdomen: No acute abnormality.

Musculoskeletal: No chest wall abnormality. No acute or significant
osseous findings.

Review of the MIP images confirms the above findings.
IMPRESSION: 1. Negative for acute pulmonary embolus or aortic dissection
2. Cardiomegaly with increased small moderate pericardial effusion
3. Mild subpleural reticulation consistent with chronic lung
disease. No acute airspace disease
4. Similar mild mediastinal adenopathy

Aortic Atherosclerosis (ZL4Y4-V3J.J) and Emphysema ([id number].Q).

## 2023-05-08 IMAGING — CR DG CHEST 2V
1 series · 2 of 2 positions shown · non-contrast
Comparison: 06/13/2021

CLINICAL DATA: Shortness of breath

EXAM:
CHEST - 2 VIEW

[Series 1: w chest lat · 0.14mm/px · 2 of 2 slices shown]
[im 1/2]
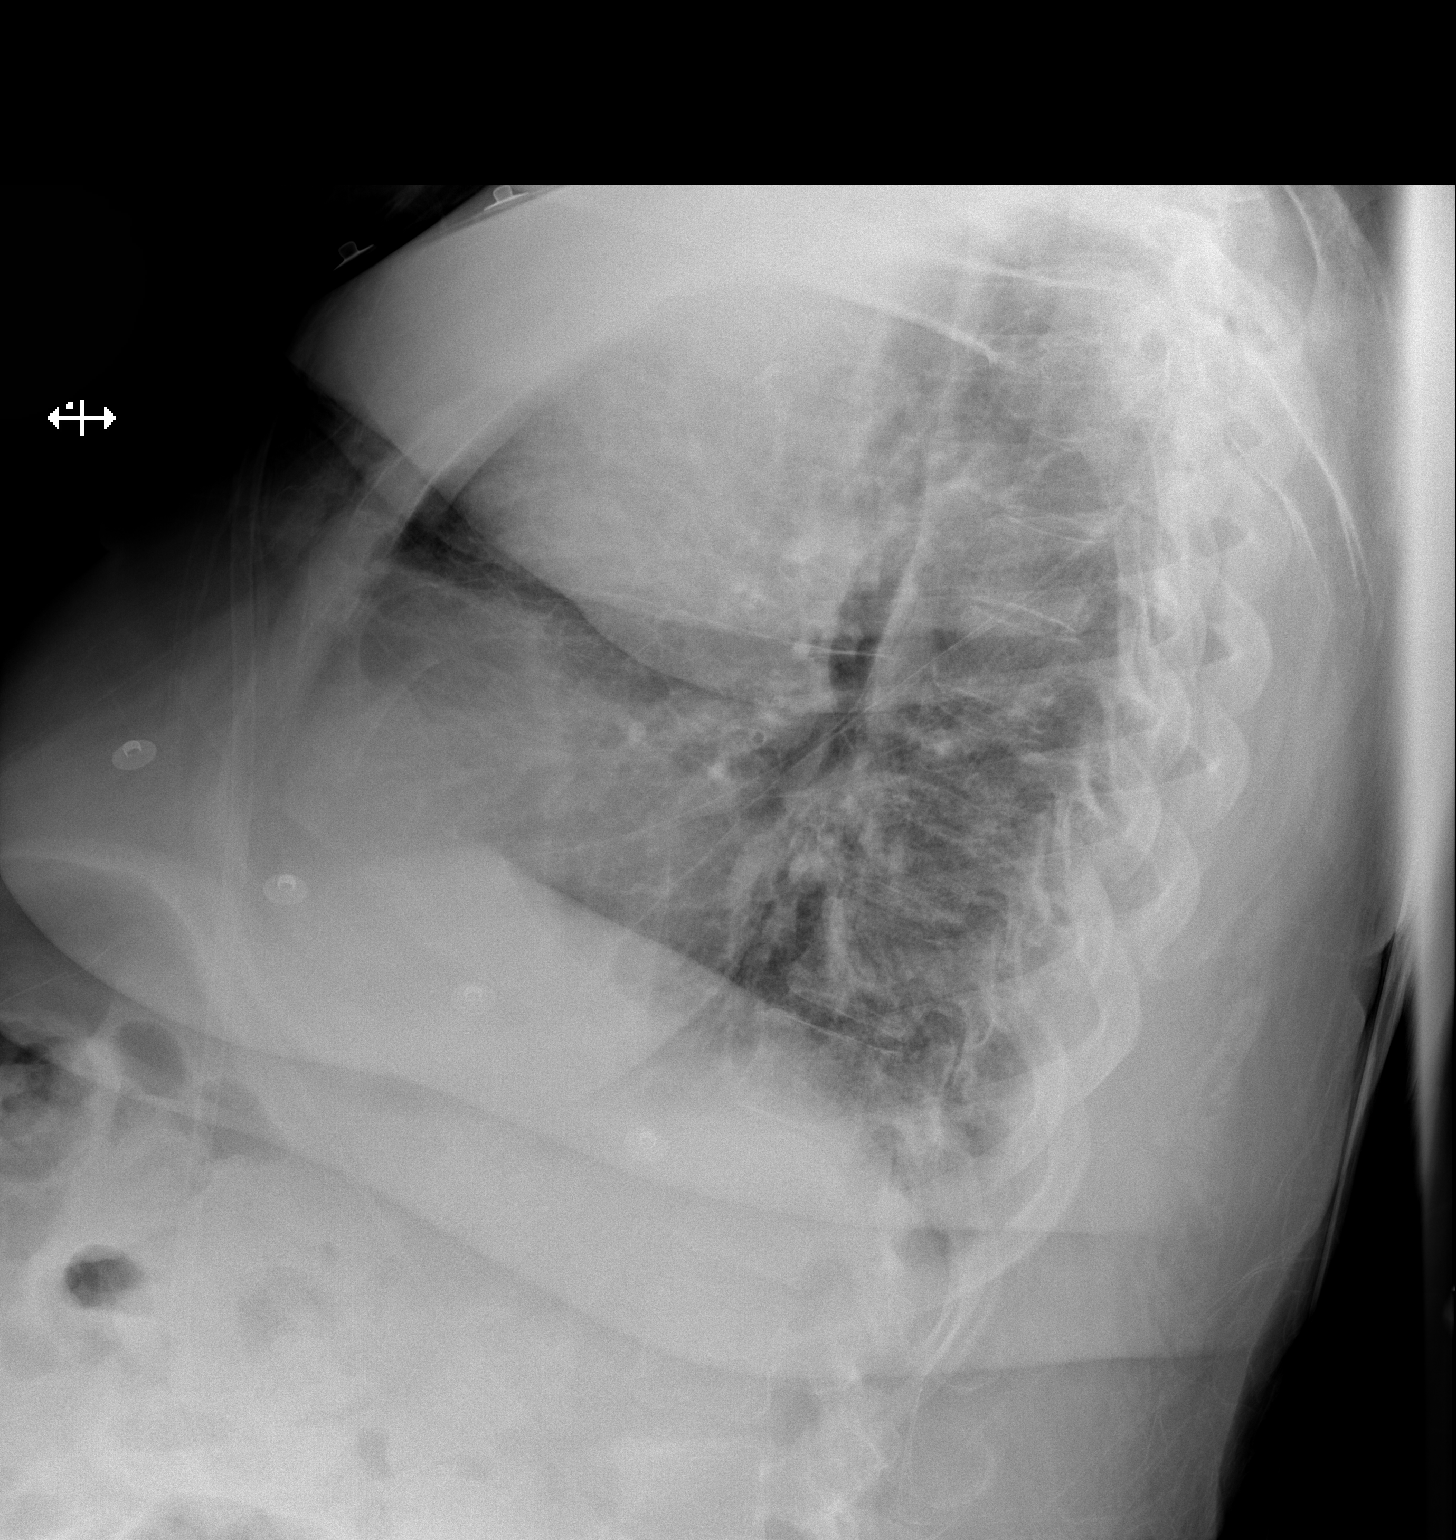
[im 2/2]
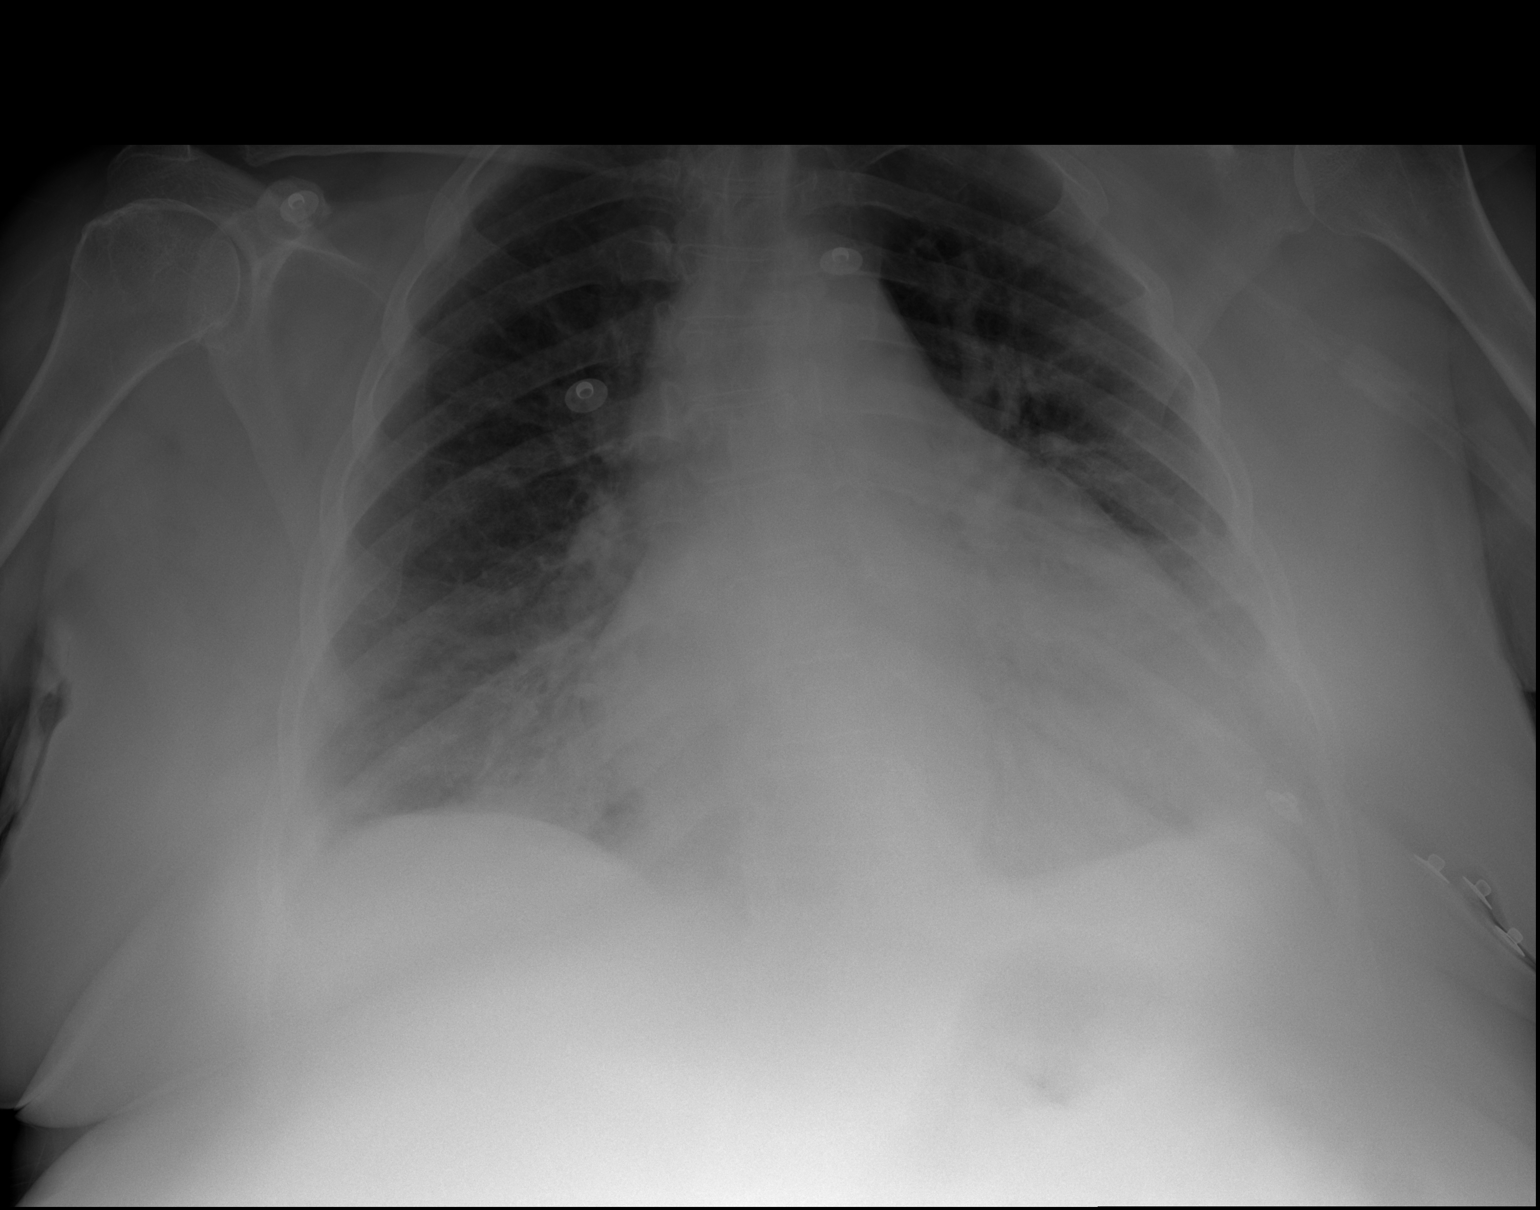

[2 of 2 positions shown; findings below may reference images not displayed]

FINDINGS: Enlarged cardiac silhouette is again identified, which appears to be
primarily due to prominent fat. Patchy density at the right lung
base. No pleural effusion. No acute osseous abnormality.
IMPRESSION: Patchy atelectasis/consolidation at the right lung base.

## 2023-06-01 ENCOUNTER — Other Ambulatory Visit: Payer: Self-pay | Admitting: Medical

## 2023-06-01 ENCOUNTER — Other Ambulatory Visit: Payer: Self-pay | Admitting: Family

## 2023-06-24 ENCOUNTER — Other Ambulatory Visit: Payer: Self-pay | Admitting: Medical

## 2023-06-24 ENCOUNTER — Other Ambulatory Visit: Payer: Self-pay | Admitting: Family

## 2023-07-16 ENCOUNTER — Other Ambulatory Visit: Payer: Self-pay | Admitting: Family

## 2023-07-16 ENCOUNTER — Other Ambulatory Visit: Payer: Self-pay | Admitting: Medical

## 2023-07-16 ENCOUNTER — Telehealth: Payer: Self-pay | Admitting: Family

## 2023-07-16 ENCOUNTER — Other Ambulatory Visit (HOSPITAL_COMMUNITY): Payer: Self-pay

## 2023-07-16 ENCOUNTER — Telehealth (HOSPITAL_COMMUNITY): Payer: Self-pay

## 2023-07-16 ENCOUNTER — Other Ambulatory Visit: Payer: Self-pay

## 2023-07-16 MED ORDER — METOPROLOL SUCCINATE ER 25 MG PO TB24: 25.0000 mg | ORAL_TABLET | Freq: Every day | ORAL | 1 refills | Status: DC

## 2023-07-16 MED ORDER — POTASSIUM CHLORIDE CRYS ER 20 MEQ PO TBCR: 60.0000 meq | EXTENDED_RELEASE_TABLET | ORAL | 3 refills | Status: DC

## 2023-07-19 NOTE — Telephone Encounter (Signed)
Please advise on Pravastatin refill.  Last visit on 08/31/22 with plan to f/u in 3 months, no future appts.  Hospital discharge on 02/03/23 has to stop Pravastatin.
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
## 2023-07-29 ENCOUNTER — Telehealth: Payer: Self-pay | Admitting: Family

## 2023-07-29 ENCOUNTER — Encounter: Payer: 59 | Admitting: Family

## 2023-07-29 NOTE — Telephone Encounter (Signed)
Patient did not show for her Heart Failure Clinic appointment on 07/29/23.

## 2023-08-01 ENCOUNTER — Other Ambulatory Visit: Payer: Self-pay | Admitting: Cardiovascular Disease

## 2023-08-19 ENCOUNTER — Other Ambulatory Visit: Payer: Self-pay | Admitting: Cardiovascular Disease

## 2023-09-10 NOTE — Telephone Encounter (Signed)
Left a message for the patient to call back to schedule a follow up. This medication was taken off of the patient's medication list  Iran Ouch, MD  Suezanne Cheshire, RN1 month ago    She needs an office visit.   Suezanne Cheshire, RN  Iran Ouch, MD1 month ago    Please Refill Pravastatin 40 mg daily

## 2023-09-13 ENCOUNTER — Encounter: Payer: Self-pay | Admitting: Medical

## 2023-09-13 NOTE — Telephone Encounter (Signed)
Unable to leave voicemail to schedule appointment. Letter sent

## 2023-09-13 NOTE — Progress Notes (Signed)
Unable to contact patient to schedule overdue follow up appointment. Letter sent

## 2023-10-03 ENCOUNTER — Other Ambulatory Visit: Payer: Self-pay | Admitting: Family

## 2023-10-04 ENCOUNTER — Telehealth: Payer: Self-pay

## 2023-10-04 NOTE — Telephone Encounter (Signed)
Metolazone refused per provider, Clarisa [facility], NP. Pt has had multiple no-show appt since medication initially prescribed.  Per Provider, pt will need an appt and lab work before medication is refilled. Attempted to schedule patient. Unable to reach, left message for patient to call our office.

## 2023-10-18 ENCOUNTER — Other Ambulatory Visit: Payer: Self-pay | Admitting: Cardiovascular Disease

## 2023-10-18 ENCOUNTER — Other Ambulatory Visit: Payer: Self-pay | Admitting: Family

## 2023-11-03 ENCOUNTER — Other Ambulatory Visit: Payer: Self-pay | Admitting: Family

## 2023-11-03 NOTE — Telephone Encounter (Signed)
Do you need to see this pt before refilling any of their medication? Patient has had several no-show's. I attempted to call again to schedule an appointment. Called pt and left voicemail message.

## 2023-11-24 ENCOUNTER — Other Ambulatory Visit: Payer: Self-pay | Admitting: Family

## 2023-11-25 ENCOUNTER — Telehealth: Payer: Self-pay

## 2023-11-25 NOTE — Telephone Encounter (Signed)
Attempted to call pt regarding medication refill requests we've received. Pt's line rung once and stated the number you have dialed isn't available. Pt's number must be out of order.

## 2023-12-08 ENCOUNTER — Ambulatory Visit: Payer: Self-pay | Admitting: Nurse Practitioner

## 2023-12-08 NOTE — Progress Notes (Deleted)
 There were no vitals taken for this visit.   Subjective:    Patient ID: Jaime Fitzgerald, female    DOB: 1961/08/03, 63 y.o.   MRN: [id number]  HPI: Jaime Fitzgerald is a 63 y.o. female  No chief complaint on file.   Discussed the use of AI scribe software for clinical note transcription with the patient, who gave verbal consent to proceed.  History of Present Illness           07/07/2022    1:13 PM 02/11/2022   12:30 PM  Depression screen PHQ 2/9  Decreased Interest 0 0  Down, Depressed, Hopeless 0 0  PHQ - 2 Score 0 0    Relevant past medical, surgical, family and social history reviewed and updated as indicated. Interim medical history since our last visit reviewed. Allergies and medications reviewed and updated.  Review of Systems  Per HPI unless specifically indicated above     Objective:    There were no vitals taken for this visit.  {Vitals History (Optional):23777} Wt Readings from Last 3 Encounters:  02/03/23 218 lb 0.6 oz (98.9 kg)  01/12/23 211 lb 4 oz (95.8 kg)  08/31/22 213 lb 6.4 oz (96.8 kg)    Physical Exam  Results for orders placed or performed during the hospital encounter of 01/29/23  CBC   Collection Time: 01/29/23  5:41 PM  Result Value Ref Range   WBC 6.9 4.0 - 10.5 K/uL   RBC 4.42 3.87 - 5.11 MIL/uL   Hemoglobin 15.1 (H) 12.0 - 15.0 g/dL   HCT 88.4 (H) 16.6 - 06.3 %   MCV 106.6 (H) 80.0 - 100.0 fL   MCH 34.2 (H) 26.0 - 34.0 pg   MCHC 32.1 30.0 - 36.0 g/dL   RDW 01.6 01.0 - 93.2 %   Platelets 235 150 - 400 K/uL   nRBC 0.0 0.0 - 0.2 %  Comprehensive metabolic panel   Collection Time: 01/29/23  5:41 PM  Result Value Ref Range   Sodium 136 135 - 145 mmol/L   Potassium 3.6 3.5 - 5.1 mmol/L   Chloride 89 (L) 98 - 111 mmol/L   CO2 34 (H) 22 - 32 mmol/L   Glucose, Bld 180 (H) 70 - 99 mg/dL   BUN 24 (H) 8 - 23 mg/dL   Creatinine, Ser 3.55 (H) 0.44 - 1.00 mg/dL   Calcium 9.1 8.9 - 73.2 mg/dL   Total Protein 8.0 6.5 - 8.1 g/dL   Albumin  4.1 3.5 - 5.0 g/dL   AST 20 15 - 41 U/L   ALT 14 0 - 44 U/L   Alkaline Phosphatase 62 38 - 126 U/L   Total Bilirubin 0.5 0.3 - 1.2 mg/dL   GFR, Estimated 48 (L) >60 mL/min   Anion gap 13 5 - 15  Brain natriuretic peptide   Collection Time: 01/29/23  5:41 PM  Result Value Ref Range   B Natriuretic Peptide 4.6 0.0 - 100.0 pg/mL  Troponin I (High Sensitivity)   Collection Time: 01/29/23  5:41 PM  Result Value Ref Range   Troponin I (High Sensitivity) 5 <18 ng/L  Hemoglobin A1c   Collection Time: 01/29/23 10:21 PM  Result Value Ref Range   Hgb A1c MFr Bld 7.6 (H) 4.8 - 5.6 %   Mean Plasma Glucose 171.42 mg/dL  HIV Antibody (routine testing w rflx)   Collection Time: 01/29/23 10:21 PM  Result Value Ref Range   HIV Screen 4th Generation wRfx Non Reactive Non  Reactive  CBC   Collection Time: 01/29/23 10:21 PM  Result Value Ref Range   WBC 7.3 4.0 - 10.5 K/uL   RBC 4.37 3.87 - 5.11 MIL/uL   Hemoglobin 15.2 (H) 12.0 - 15.0 g/dL   HCT 16.1 09.6 - 04.5 %   MCV 105.3 (H) 80.0 - 100.0 fL   MCH 34.8 (H) 26.0 - 34.0 pg   MCHC 33.0 30.0 - 36.0 g/dL   RDW 40.9 81.1 - 91.4 %   Platelets 236 150 - 400 K/uL   nRBC 0.0 0.0 - 0.2 %  Creatinine, serum   Collection Time: 01/29/23 10:21 PM  Result Value Ref Range   Creatinine, Ser 1.18 (H) 0.44 - 1.00 mg/dL   GFR, Estimated 52 (L) >60 mL/min  Glucose, capillary   Collection Time: 01/29/23 11:09 PM  Result Value Ref Range   Glucose-Capillary 151 (H) 70 - 99 mg/dL  Glucose, capillary   Collection Time: 01/30/23  7:50 AM  Result Value Ref Range   Glucose-Capillary 188 (H) 70 - 99 mg/dL  Iron and TIBC   Collection Time: 01/30/23  8:29 AM  Result Value Ref Range   Iron 66 28 - 170 ug/dL   TIBC 782 956 - 213 ug/dL   Saturation Ratios 22 10.4 - 31.8 %   UIBC 235 ug/dL  Basic metabolic panel   Collection Time: 01/30/23  8:33 AM  Result Value Ref Range   Sodium 137 135 - 145 mmol/L   Potassium 3.6 3.5 - 5.1 mmol/L   Chloride 89 (L) 98 -  111 mmol/L   CO2 36 (H) 22 - 32 mmol/L   Glucose, Bld 187 (H) 70 - 99 mg/dL   BUN 28 (H) 8 - 23 mg/dL   Creatinine, Ser 0.86 (H) 0.44 - 1.00 mg/dL   Calcium 8.8 (L) 8.9 - 10.3 mg/dL   GFR, Estimated 45 (L) >60 mL/min   Anion gap 12 5 - 15  CBC   Collection Time: 01/30/23  8:33 AM  Result Value Ref Range   WBC 5.6 4.0 - 10.5 K/uL   RBC 4.33 3.87 - 5.11 MIL/uL   Hemoglobin 14.9 12.0 - 15.0 g/dL   HCT 57.8 46.9 - 62.9 %   MCV 104.8 (H) 80.0 - 100.0 fL   MCH 34.4 (H) 26.0 - 34.0 pg   MCHC 32.8 30.0 - 36.0 g/dL   RDW 52.8 41.3 - 24.4 %   Platelets 223 150 - 400 K/uL   nRBC 0.0 0.0 - 0.2 %  Magnesium   Collection Time: 01/30/23  8:33 AM  Result Value Ref Range   Magnesium 1.6 (L) 1.7 - 2.4 mg/dL  Phosphorus   Collection Time: 01/30/23  8:33 AM  Result Value Ref Range   Phosphorus 4.7 (H) 2.5 - 4.6 mg/dL  Folate   Collection Time: 01/30/23  8:33 AM  Result Value Ref Range   Folate 14.6 >5.9 ng/mL  VITAMIN D 25 Hydroxy (Vit-D Deficiency, Fractures)   Collection Time: 01/30/23  8:33 AM  Result Value Ref Range   Vit D, 25-Hydroxy 15.48 (L) 30 - 100 ng/mL  Vitamin B12   Collection Time: 01/30/23  8:33 AM  Result Value Ref Range   Vitamin B-12 582 180 - 914 pg/mL  Glucose, capillary   Collection Time: 01/30/23 12:01 PM  Result Value Ref Range   Glucose-Capillary 167 (H) 70 - 99 mg/dL  ECHOCARDIOGRAM COMPLETE   Collection Time: 01/30/23  3:43 PM  Result Value Ref Range   Weight 3,298.08  oz   Height 63 in   BP 123/69 mmHg   S' Lateral 2.90 cm   Area-P 1/2 2.79 cm2   Est EF 60 - 65%   Glucose, capillary   Collection Time: 01/30/23  5:19 PM  Result Value Ref Range   Glucose-Capillary 121 (H) 70 - 99 mg/dL  Glucose, capillary   Collection Time: 01/30/23  9:43 PM  Result Value Ref Range   Glucose-Capillary 171 (H) 70 - 99 mg/dL   Comment 1 Notify RN   Basic metabolic panel   Collection Time: 01/31/23  5:26 AM  Result Value Ref Range   Sodium 134 (L) 135 - 145 mmol/L    Potassium 3.7 3.5 - 5.1 mmol/L   Chloride 85 (L) 98 - 111 mmol/L   CO2 37 (H) 22 - 32 mmol/L   Glucose, Bld 149 (H) 70 - 99 mg/dL   BUN 31 (H) 8 - 23 mg/dL   Creatinine, Ser 0.98 (H) 0.44 - 1.00 mg/dL   Calcium 9.0 8.9 - 11.9 mg/dL   GFR, Estimated 45 (L) >60 mL/min   Anion gap 12 5 - 15  CBC   Collection Time: 01/31/23  5:26 AM  Result Value Ref Range   WBC 6.1 4.0 - 10.5 K/uL   RBC 4.30 3.87 - 5.11 MIL/uL   Hemoglobin 14.5 12.0 - 15.0 g/dL   HCT 14.7 82.9 - 56.2 %   MCV 104.0 (H) 80.0 - 100.0 fL   MCH 33.7 26.0 - 34.0 pg   MCHC 32.4 30.0 - 36.0 g/dL   RDW 13.0 86.5 - 78.4 %   Platelets 214 150 - 400 K/uL   nRBC 0.0 0.0 - 0.2 %  Magnesium   Collection Time: 01/31/23  5:26 AM  Result Value Ref Range   Magnesium 2.2 1.7 - 2.4 mg/dL  Phosphorus   Collection Time: 01/31/23  5:26 AM  Result Value Ref Range   Phosphorus 5.3 (H) 2.5 - 4.6 mg/dL  Glucose, capillary   Collection Time: 01/31/23  8:12 AM  Result Value Ref Range   Glucose-Capillary 165 (H) 70 - 99 mg/dL  Glucose, capillary   Collection Time: 01/31/23 11:33 AM  Result Value Ref Range   Glucose-Capillary 193 (H) 70 - 99 mg/dL  Glucose, capillary   Collection Time: 01/31/23  4:19 PM  Result Value Ref Range   Glucose-Capillary 123 (H) 70 - 99 mg/dL  Glucose, capillary   Collection Time: 01/31/23  9:35 PM  Result Value Ref Range   Glucose-Capillary 179 (H) 70 - 99 mg/dL  Basic metabolic panel   Collection Time: 02/01/23  4:15 AM  Result Value Ref Range   Sodium 134 (L) 135 - 145 mmol/L   Potassium 3.6 3.5 - 5.1 mmol/L   Chloride 86 (L) 98 - 111 mmol/L   CO2 38 (H) 22 - 32 mmol/L   Glucose, Bld 165 (H) 70 - 99 mg/dL   BUN 37 (H) 8 - 23 mg/dL   Creatinine, Ser 6.96 (H) 0.44 - 1.00 mg/dL   Calcium 9.6 8.9 - 29.5 mg/dL   GFR, Estimated 51 (L) >60 mL/min   Anion gap 10 5 - 15  CBC   Collection Time: 02/01/23  4:15 AM  Result Value Ref Range   WBC 6.4 4.0 - 10.5 K/uL   RBC 4.16 3.87 - 5.11 MIL/uL    Hemoglobin 14.1 12.0 - 15.0 g/dL   HCT 28.4 13.2 - 44.0 %   MCV 104.6 (H) 80.0 - 100.0 fL  MCH 33.9 26.0 - 34.0 pg   MCHC 32.4 30.0 - 36.0 g/dL   RDW 21.3 08.6 - 57.8 %   Platelets 209 150 - 400 K/uL   nRBC 0.0 0.0 - 0.2 %  Magnesium   Collection Time: 02/01/23  4:15 AM  Result Value Ref Range   Magnesium 1.9 1.7 - 2.4 mg/dL  Phosphorus   Collection Time: 02/01/23  4:15 AM  Result Value Ref Range   Phosphorus 4.7 (H) 2.5 - 4.6 mg/dL  Glucose, capillary   Collection Time: 02/01/23  7:38 AM  Result Value Ref Range   Glucose-Capillary 177 (H) 70 - 99 mg/dL  Glucose, capillary   Collection Time: 02/01/23 11:54 AM  Result Value Ref Range   Glucose-Capillary 166 (H) 70 - 99 mg/dL  Glucose, capillary   Collection Time: 02/01/23  5:18 PM  Result Value Ref Range   Glucose-Capillary 145 (H) 70 - 99 mg/dL  Glucose, capillary   Collection Time: 02/01/23  9:38 PM  Result Value Ref Range   Glucose-Capillary 148 (H) 70 - 99 mg/dL   Comment 1 Notify RN   Basic metabolic panel   Collection Time: 02/02/23  6:11 AM  Result Value Ref Range   Sodium 135 135 - 145 mmol/L   Potassium 3.8 3.5 - 5.1 mmol/L   Chloride 90 (L) 98 - 111 mmol/L   CO2 34 (H) 22 - 32 mmol/L   Glucose, Bld 162 (H) 70 - 99 mg/dL   BUN 31 (H) 8 - 23 mg/dL   Creatinine, Ser 4.69 (H) 0.44 - 1.00 mg/dL   Calcium 8.8 (L) 8.9 - 10.3 mg/dL   GFR, Estimated 52 (L) >60 mL/min   Anion gap 11 5 - 15  CBC   Collection Time: 02/02/23  6:11 AM  Result Value Ref Range   WBC 6.8 4.0 - 10.5 K/uL   RBC 4.31 3.87 - 5.11 MIL/uL   Hemoglobin 14.7 12.0 - 15.0 g/dL   HCT 62.9 52.8 - 41.3 %   MCV 103.5 (H) 80.0 - 100.0 fL   MCH 34.1 (H) 26.0 - 34.0 pg   MCHC 33.0 30.0 - 36.0 g/dL   RDW 24.4 01.0 - 27.2 %   Platelets 207 150 - 400 K/uL   nRBC 0.0 0.0 - 0.2 %  Magnesium   Collection Time: 02/02/23  6:11 AM  Result Value Ref Range   Magnesium 1.5 (L) 1.7 - 2.4 mg/dL  Phosphorus   Collection Time: 02/02/23  6:11 AM  Result Value  Ref Range   Phosphorus 4.2 2.5 - 4.6 mg/dL  Glucose, capillary   Collection Time: 02/02/23  8:02 AM  Result Value Ref Range   Glucose-Capillary 145 (H) 70 - 99 mg/dL  Glucose, capillary   Collection Time: 02/02/23 11:24 AM  Result Value Ref Range   Glucose-Capillary 201 (H) 70 - 99 mg/dL  Glucose, capillary   Collection Time: 02/02/23  4:23 PM  Result Value Ref Range   Glucose-Capillary 114 (H) 70 - 99 mg/dL  Glucose, capillary   Collection Time: 02/02/23 10:07 PM  Result Value Ref Range   Glucose-Capillary 156 (H) 70 - 99 mg/dL  Basic metabolic panel   Collection Time: 02/03/23  6:12 AM  Result Value Ref Range   Sodium 136 135 - 145 mmol/L   Potassium 4.2 3.5 - 5.1 mmol/L   Chloride 92 (L) 98 - 111 mmol/L   CO2 35 (H) 22 - 32 mmol/L   Glucose, Bld 155 (H) 70 - 99 mg/dL  BUN 31 (H) 8 - 23 mg/dL   Creatinine, Ser 1.61 (H) 0.44 - 1.00 mg/dL   Calcium 9.4 8.9 - 09.6 mg/dL   GFR, Estimated 51 (L) >60 mL/min   Anion gap 9 5 - 15  CBC   Collection Time: 02/03/23  6:12 AM  Result Value Ref Range   WBC 5.7 4.0 - 10.5 K/uL   RBC 4.16 3.87 - 5.11 MIL/uL   Hemoglobin 14.2 12.0 - 15.0 g/dL   HCT 04.5 40.9 - 81.1 %   MCV 105.0 (H) 80.0 - 100.0 fL   MCH 34.1 (H) 26.0 - 34.0 pg   MCHC 32.5 30.0 - 36.0 g/dL   RDW 91.4 78.2 - 95.6 %   Platelets 187 150 - 400 K/uL   nRBC 0.0 0.0 - 0.2 %  Magnesium   Collection Time: 02/03/23  6:12 AM  Result Value Ref Range   Magnesium 1.8 1.7 - 2.4 mg/dL  Phosphorus   Collection Time: 02/03/23  6:12 AM  Result Value Ref Range   Phosphorus 4.8 (H) 2.5 - 4.6 mg/dL  Glucose, capillary   Collection Time: 02/03/23  7:27 AM  Result Value Ref Range   Glucose-Capillary 162 (H) 70 - 99 mg/dL  Glucose, capillary   Collection Time: 02/03/23 11:51 AM  Result Value Ref Range   Glucose-Capillary 205 (H) 70 - 99 mg/dL   {Labs (OZHYQMVH):84696}    Assessment & Plan:   Problem List Items Addressed This Visit   None    Assessment and Plan              Follow up plan: No follow-ups on file.

## 2023-12-09 ENCOUNTER — Ambulatory Visit: Payer: Self-pay | Admitting: Nurse Practitioner

## 2024-01-12 ENCOUNTER — Other Ambulatory Visit: Payer: Self-pay

## 2024-01-12 MED ORDER — POTASSIUM CHLORIDE CRYS ER 20 MEQ PO TBCR: 60.0000 meq | EXTENDED_RELEASE_TABLET | ORAL | 0 refills | Status: DC

## 2024-01-12 MED ORDER — METOPROLOL SUCCINATE ER 25 MG PO TB24: 25.0000 mg | ORAL_TABLET | Freq: Every day | ORAL | 0 refills | Status: DC

## 2024-01-21 ENCOUNTER — Other Ambulatory Visit: Payer: Self-pay | Admitting: Family

## 2024-01-26 ENCOUNTER — Telehealth: Payer: Self-pay

## 2024-01-26 ENCOUNTER — Telehealth: Payer: Self-pay | Admitting: Family

## 2024-01-26 NOTE — Telephone Encounter (Signed)
 Called to confirm/remind patient of their appointment at the [facility] on 01/27/24.   Appointment:   [] Confirmed  [x] Left mess   [] No answer/No voice mail  [] VM Full/unable to leave message  [] Phone not in service  Patient reminded to bring all medications and/or complete list.  Confirmed patient has transportation. Gave directions, instructed to utilize valet parking.

## 2024-01-26 NOTE — Progress Notes (Signed)
 Attempted to call pt regarding medication refills. Pt needs to come in to be seen for follow up visit before receiving any further medication. Pt does have an appt for tomorrow. Will try to get updated number at visit tomorrow, 01/26/24.

## 2024-01-27 ENCOUNTER — Ambulatory Visit
Admission: RE | Admit: 2024-01-27 | Discharge: 2024-01-27 | Disposition: A | Discharge status: Home / Self Care | Source: Ambulatory Visit | Attending: Cardiovascular Disease | Admitting: Cardiovascular Disease

## 2024-01-27 ENCOUNTER — Other Ambulatory Visit: Payer: Self-pay | Admitting: Family

## 2024-01-27 ENCOUNTER — Ambulatory Visit: Admitting: Family

## 2024-01-27 ENCOUNTER — Encounter: Payer: Self-pay | Admitting: Family

## 2024-01-27 VITALS — BP 148/74 | HR 77 | Wt 200.8 lb

## 2024-01-27 DIAGNOSIS — E669 Obesity, unspecified: Secondary | ICD-10-CM | POA: Diagnosis not present

## 2024-01-27 DIAGNOSIS — E119 Type 2 diabetes mellitus without complications: Secondary | ICD-10-CM | POA: Insufficient documentation

## 2024-01-27 DIAGNOSIS — I110 Hypertensive heart disease with heart failure: Secondary | ICD-10-CM | POA: Insufficient documentation

## 2024-01-27 DIAGNOSIS — G4730 Sleep apnea, unspecified: Secondary | ICD-10-CM | POA: Insufficient documentation

## 2024-01-27 DIAGNOSIS — J4489 Other specified chronic obstructive pulmonary disease: Secondary | ICD-10-CM | POA: Diagnosis not present

## 2024-01-27 DIAGNOSIS — I5033 Acute on chronic diastolic (congestive) heart failure: Secondary | ICD-10-CM

## 2024-01-27 DIAGNOSIS — I10 Essential (primary) hypertension: Secondary | ICD-10-CM

## 2024-01-27 DIAGNOSIS — R140 Abdominal distension (gaseous): Secondary | ICD-10-CM | POA: Insufficient documentation

## 2024-01-27 DIAGNOSIS — F17210 Nicotine dependence, cigarettes, uncomplicated: Secondary | ICD-10-CM | POA: Diagnosis not present

## 2024-01-27 DIAGNOSIS — Z79899 Other long term (current) drug therapy: Secondary | ICD-10-CM | POA: Diagnosis not present

## 2024-01-27 DIAGNOSIS — E785 Hyperlipidemia, unspecified: Secondary | ICD-10-CM | POA: Insufficient documentation

## 2024-01-27 DIAGNOSIS — Z720 Tobacco use: Secondary | ICD-10-CM

## 2024-01-27 DIAGNOSIS — J449 Chronic obstructive pulmonary disease, unspecified: Secondary | ICD-10-CM

## 2024-01-27 DIAGNOSIS — Z7984 Long term (current) use of oral hypoglycemic drugs: Secondary | ICD-10-CM | POA: Insufficient documentation

## 2024-01-27 LAB — BASIC METABOLIC PANEL WITH GFR
Anion gap: 11 (ref 5–15)
BUN: 20 mg/dL (ref 8–23)
CO2: 29 mmol/L (ref 22–32)
Calcium: 8.7 mg/dL — ABNORMAL LOW (ref 8.9–10.3)
Chloride: 97 mmol/L — ABNORMAL LOW (ref 98–111)
Creatinine, Ser: 1.23 mg/dL — ABNORMAL HIGH (ref 0.44–1.00)
GFR, Estimated: 49 mL/min — ABNORMAL LOW (ref >60)
Glucose, Bld: 98 mg/dL (ref 70–99)
Potassium: 3.9 mmol/L (ref 3.5–5.1)
Sodium: 137 mmol/L (ref 135–145)

## 2024-01-27 LAB — BRAIN NATRIURETIC PEPTIDE: B Natriuretic Peptide: 22 pg/mL (ref 0.0–100.0)

## 2024-01-27 MED ORDER — POTASSIUM CHLORIDE CRYS ER 20 MEQ PO TBCR
40.0000 meq | EXTENDED_RELEASE_TABLET | Freq: Once | ORAL | Status: AC
  Administered 2024-01-27: 40 meq via ORAL

## 2024-01-27 MED ORDER — POTASSIUM CHLORIDE CRYS ER 20 MEQ PO TBCR
EXTENDED_RELEASE_TABLET | ORAL | Status: AC
  Filled 2024-01-27: qty 2

## 2024-01-27 MED ORDER — FUROSEMIDE 10 MG/ML IJ SOLN
INTRAMUSCULAR | Status: AC
  Filled 2024-01-27: qty 8

## 2024-01-27 MED ORDER — FUROSEMIDE 10 MG/ML IJ SOLN
80.0000 mg | Freq: Once | INTRAMUSCULAR | Status: AC
  Administered 2024-01-27: 80 mg via INTRAVENOUS

## 2024-01-27 NOTE — Patient Instructions (Signed)
 Medication Changes:  START TORSEMIDE 40 MG ONCE DAILY  START POTASSIUM 20 MEQ ONCE DAILY   Referrals:  We have placed a referral today to General Cardiology for you to follow-up. They should reach out to you within 1-2 weeks. If they do not, please reach out to them. Their information is below on this AVS.  Special Instructions: Go over to Same Day Surgery and have your IV Lasix  completed.   Follow-Up in: 2 weeks with Shawnee Dellen, FNP.  At the [facility], you and your health needs are our priority. We have a designated team specialized in the treatment of Heart Failure. This Care Team includes your primary Heart Failure Specialized Cardiologist (physician), Advanced Practice Providers (APPs- Physician Assistants and Nurse Practitioners), and Pharmacist who all work together to provide you with the care you need, when you need it.   You may see any of the following providers on your designated Care Team at your next follow up:  Dr. Jules Oar Dr. Peder Bourdon Dr. Alwin Baars Dr. Judyth Nunnery Shawnee Dellen, FNP Bevely Brush, RPH-CPP  Please be sure to bring in all your medications bottles to every appointment.   Need to Contact Us :  If you have any questions or concerns before your next appointment please send us  a message through [place] or call our office at [phone].    TO LEAVE A MESSAGE FOR THE NURSE SELECT OPTION 2, PLEASE LEAVE A MESSAGE INCLUDING: YOUR NAME DATE OF BIRTH CALL BACK NUMBER REASON FOR CALL**this is important as we prioritize the call backs  YOU WILL RECEIVE A CALL BACK THE SAME DAY AS LONG AS YOU CALL BEFORE 4:00 PM

## 2024-01-27 NOTE — Progress Notes (Signed)
 Patient brought to Same Day surgery via wheelchair to have IV lasix .  As patient was wheeled back to her room she said needed to go to the restroom.  Taken to restroom and assisted to commode.  When assisting patient back to her wheelchair she started complaining of squeezing chest pain under the left breast that is "squeezing and feels like a charlie horse".  Rates pain 10/10.  Denies SOB.  Has her home O2 with her but is on R/A.  When returned to her room patient asked to have her O2 applied.  Denies SOB but states that she feels like she needs it to help the pain.  O2 applied at 1.5 L [place] as worn at home.  Notified [facility] of VS and pain complaints.  Ms Everitt Hoa advises that the patient has not been to the HF clinic in a year and has been out of her meds for some time.  Ms. Verlinda Gloss advises that if symptoms do not improve patient should go to the ED.  Patient and her fiance were informed of what Ms Everitt Hoa had said.  Patient states that she has a ride waiting for her and she does not want to go to the ED.  Patient does state that she had a similar event last night that lasted for about an hour and a half.  IV started and saline locked.  IV lasix  administered as ordered.

## 2024-01-27 NOTE — Progress Notes (Signed)
 Patient transported in her own wheelchair to the Same Day Surgery desk to check in for a procedure from the [facility]. Sig other accompanied patient.

## 2024-01-27 NOTE — Progress Notes (Signed)
 [facility] Note    PCP: [facility] (last seen 03/24) Primary cardiologist: Antionette Kirks, MD (last seen 11/23)  Chief Complaint: shortness of breath  HPI  Jaime Fitzgerald is a 63 year old female with a past medical history of hypertension, acute blood loss anemia with history of GI bleed, asthma, diabetes mellitus type 2, seizures, sleep apnea, hyperlipidemia, COPD dyspnea, obesity.  Echo 11/26/21: EF of 60-65% along with mild LVH. Echo 10/14/21: EF of 45-50% with moderate LV dysfunction, GIII restrictive. Repeat echo scheduled on 11/26/21 by Dr. Alvenia Aus as his review of the echo did not match the above interpretation.   Admitted 01/29/23 with shortness of breath for several weeks along with severe orthopnea, abdominal distention and pedal edema. Had recently been started on metolazone  and started having cramping. CXR: Cardiomegaly. Central pulmonary vessels are prominent suggesting CHF. Increased interstitial markings are seen in parahilar regions and lower lung fields suggesting interstitial pulmonary edema. Possible small left pleural effusion. Echo 01/30/23: EF 60-65% with mild LVH, G1DD, normal RV. Initially IV diuresed but then stopped due to AKI.   She presents today for a HF follow-up visit (although hasn't been seen since 04/24) with a chief complaint of shortness of breath. Has associated fatigue, abdominal distention, cough, pedal, dizziness, wheezing and 4 pillow orthopnea along with this. Denies chest pain, palpitations. Says that she's been out of all of her medications for "awhile". Has oxygen  at 1.5L that she wears at bedtime and PRN during the day.   Continues to smoke 1 cigarette daily in the morning after she drinks her coffee.    ROS: All systems negative except what is listed in HPI, PMH and Problem List  Past Medical History:  Diagnosis Date   Acute blood loss anemia 10/11/2019   Acute GI bleeding 10/11/2019   Anginal pain (HCC)     Arthritis    Asthma    CHF (congestive heart failure) (HCC)    Diabetes mellitus without complication (HCC)    Dyspnea    Hypertension    Seizures (HCC)    Sleep apnea    Past Surgical History:  Procedure Laterality Date   CARPAL TUNNEL RELEASE     COLONOSCOPY WITH PROPOFOL  N/A 01/17/2020   Procedure: COLONOSCOPY WITH PROPOFOL ;  Surgeon: Selena Daily, MD;  Location: [facility] ENDOSCOPY;  Service: Gastroenterology;  Laterality: N/A;   ESOPHAGOGASTRODUODENOSCOPY N/A 10/13/2019   Procedure: ESOPHAGOGASTRODUODENOSCOPY (EGD);  Surgeon: Irby Mannan, MD;  Location: [facility] ENDOSCOPY;  Service: Endoscopy;  Laterality: N/A;   ESOPHAGOGASTRODUODENOSCOPY N/A 09/18/2020   Procedure: ESOPHAGOGASTRODUODENOSCOPY (EGD);  Surgeon: Toledo, Alphonsus Jeans, MD;  Location: [facility] ENDOSCOPY;  Service: Gastroenterology;  Laterality: N/A;   ESOPHAGOGASTRODUODENOSCOPY (EGD) WITH PROPOFOL  N/A 10/11/2019   Procedure: ESOPHAGOGASTRODUODENOSCOPY (EGD) WITH PROPOFOL ;  Surgeon: Irby Mannan, MD;  Location: [facility] ENDOSCOPY;  Service: Endoscopy;  Laterality: N/A;   ESOPHAGOGASTRODUODENOSCOPY (EGD) WITH PROPOFOL  N/A 01/17/2020   Procedure: ESOPHAGOGASTRODUODENOSCOPY (EGD) WITH PROPOFOL ;  Surgeon: Selena Daily, MD;  Location: [facility] ENDOSCOPY;  Service: Gastroenterology;  Laterality: N/A;   TONSILLECTOMY     Family History  Problem Relation Age of Onset   Heart Problems Mother    Seizures Father    Social History   Tobacco Use   Smoking status: Former    Types: Cigarettes   Smokeless tobacco: Never  Substance Use Topics   Alcohol  use: Not Currently   Allergies  Allergen Reactions   Aspirin Nausea Only and Other (See Comments)    GI upset  Penicillins Rash    Prior to Admission medications   Medication Sig Start Date End Date Taking? Authorizing Provider  albuterol  (VENTOLIN  HFA) 108 (90 Base) MCG/ACT inhaler Inhale 2 puffs into the lungs every 6 (six) hours as needed for wheezing or shortness  of breath. 10/14/19  Yes Garnet Just, MD  amLODipine  (NORVASC ) 5 MG tablet TAKE ONE TABLET BY MOUTH DAILY 09/23/22  Yes Wenona Hamilton, MD  cyclobenzaprine (FLEXERIL) 10 MG tablet Take 10 mg by mouth as needed for muscle spasms.   Yes [provider]  Fluticasone Propionate, Inhal, (FLOVENT IN) Inhale into the lungs as needed.   Yes [provider]  furosemide  (LASIX ) 40 MG tablet TAKE ONE TABLET BY MOUTH DAILY 11/12/22  Yes Furth, Cadence H, PA-C  glipiZIDE (GLUCOTROL XL) 10 MG 24 hr tablet Take 10 mg by mouth 2 (two) times daily. 10/27/21  Yes [provider]  JARDIANCE  10 MG TABS tablet TAKE ONE TABLET BY MOUTH EVERY DAY BEFORE BREAKFAST 12/24/22  Yes Shawnee Dellen A, FNP  metoprolol  succinate (TOPROL -XL) 25 MG 24 hr tablet Take 1 tablet (25 mg total) by mouth daily. 11/24/22  Yes Furth, Cadence H, PA-C  pantoprazole  (PROTONIX ) 40 MG tablet Take 1 tablet (40 mg total) by mouth daily. 08/31/22  Yes Furth, Cadence H, PA-C  pravastatin  (PRAVACHOL ) 40 MG tablet TAKE ONE TABLET BY MOUTH EVERY DAY 11/07/22  Yes Shawnee Dellen A, FNP  vitamin B-12 (CYANOCOBALAMIN ) 1000 MCG tablet Take 1 tablet (1,000 mcg total) by mouth daily. 08/05/21  Yes Vanga, Elson Halon, MD  meclizine  (ANTIVERT ) 12.5 MG tablet Take 1 tablet (12.5 mg total) by mouth 3 (three) times daily as needed for up to 30 doses for dizziness. Patient not taking: Reported on 01/12/2023 08/31/22   Furth, Cadence H, PA-C  metoprolol  tartrate (LOPRESSOR ) 100 MG tablet Take 1 tablet (100 mg) by mouth 2 hours prior to your Cardiac CT 07/23/22   Furth, Cadence H, PA-C  witch hazel-glycerin  (TUCKS) pad Apply topically as needed for itching. Patient not taking: Reported on 01/12/2023 10/17/21   Margery Sheets B, MD    Vitals:   01/27/24 1430  BP: (!) 148/74  Pulse: 77  SpO2: 92%  Weight: 200 lb 12.8 oz (91.1 kg)   Wt Readings from Last 3 Encounters:  01/27/24 200 lb 12.8 oz (91.1 kg)  02/03/23 218 lb 0.6 oz (98.9 kg)   01/12/23 211 lb 4 oz (95.8 kg)   Lab Results  Component Value Date   CREATININE 1.20 (H) 02/03/2023   CREATININE 1.17 (H) 02/02/2023   CREATININE 1.19 (H) 02/01/2023    Physical Exam:   General: Well appearing. No resp difficulty HEENT: normal Neck: supple, JVD elevated to jawline Cor: Regular rhythm, rate. No rubs, gallops or murmurs Lungs: clear Abdomen: soft, nontender, distended. Extremities: no cyanosis, clubbing, rash, 2+ pitting edema bilateral lower legs Neuro: alert & oriented X 3. Moves all 4 extremities w/o difficulty. Affect pleasant    Assessment and Plan:  Acute on Chronic heart failure with preserved ejection fraction- - suspect due to HTN - NYHA III today - fluid overloaded today with worsening SOB/ abdominal distention / orthopnea - weight down 11 pounds from last visit here 1 year ago - Echo 11/26/21: EF of 60-65% along with mild LVH. Echo 10/14/21: EF of 45-50% with moderate LV dysfunction, GIII restrictive. Repeat echo scheduled on 11/26/21 by Dr. Alvenia Aus as his review of the echo did not match the above interpretation.  - Echo 01/30/23: EF  60-65% with mild LVH, G1DD, normal RV. - will send for 80mg  IV lasix / 40meq PO potassium - BMP/ BNP today - tomorrow begin 40mg  torsemide daily & potassium 20meq potassium daily - continue metoprolol  succinate 25mg  daily - using regular salt as well as eating salty snacks/ foods; emphasized not doing this anymore - saw cardiology Gennaro Khat) on 11/23; cardiology referral placed for her to get reestablished with them - BNP 10/13/21 was 8.4  2. HTN - - BP 148/74 - [facility]  - BMP 02/03/23 reviewed: sodium 136, potassium 4.2, creatinine 1.2 and GFR 51 - BMET today  3. Tobacco abuse - - smoking 1 cigarette day - encouraged complete cessation  4: DM- - A1c 01/29/23 was 7.6%  5: COPD/ asthma- - wearing oxygen  at 1.5L at bedtime and PRN during the day - needs to follow-up with PCP   Return in 2 weeks,  sooner if needed. Comes with transportation so can't come frequently.   Charlette Console, FNP 01/27/24

## 2024-01-27 NOTE — Progress Notes (Signed)
 Patient states all pain under the left breast is gone.  Patient removed her O2, requested to go to restroom again.  Taken to [facility] via wheelchair assisted to commode.  When patient was returned to her room she denied any return of chest pain.

## 2024-02-02 ENCOUNTER — Telehealth: Payer: Self-pay | Admitting: Family

## 2024-02-02 ENCOUNTER — Other Ambulatory Visit: Payer: Self-pay

## 2024-02-02 MED ORDER — TORSEMIDE 40 MG PO TABS: 40.0000 mg | ORAL_TABLET | Freq: Every day | ORAL | 1 refills | Status: DC

## 2024-02-02 MED ORDER — POTASSIUM CHLORIDE CRYS ER 20 MEQ PO TBCR: 60.0000 meq | EXTENDED_RELEASE_TABLET | ORAL | 1 refills | Status: DC

## 2024-02-02 MED ORDER — POTASSIUM CHLORIDE CRYS ER 20 MEQ PO TBCR: 20.0000 meq | EXTENDED_RELEASE_TABLET | Freq: Every day | ORAL | 1 refills | Status: DC

## 2024-02-02 NOTE — Progress Notes (Unsigned)
 Per Shawnee Dellen, FNP, "No to the NTG, she needs to get that from Arida's office."  Pt needs to call Dr. Alvenia Aus [facility]) to get Nitrogylcerin. [phone]

## 2024-02-09 ENCOUNTER — Telehealth: Payer: Self-pay | Admitting: Family

## 2024-02-09 NOTE — Telephone Encounter (Signed)
 Called to confirm/remind patient of their appointment at the [facility] on 02/10/24.   Appointment:   [] Confirmed  [] Left mess   [x] No answer/No voice mail  [] VM Full/unable to leave message  [] Phone not in service  Patient reminded to bring all medications and/or complete list.  Confirmed patient has transportation. Gave directions, instructed to utilize valet parking.

## 2024-02-09 NOTE — Progress Notes (Deleted)
 [facility] Note    PCP: [facility] (last seen 03/24) Primary cardiologist: Antionette Kirks, MD (last seen 11/23)  Chief Complaint: shortness of breath  HPI  Jaime Fitzgerald is a 63 year old female with a past medical history of hypertension, acute blood loss anemia with history of GI bleed, asthma, diabetes mellitus type 2, seizures, sleep apnea, hyperlipidemia, COPD dyspnea, obesity.  Echo 11/26/21: EF of 60-65% along with mild LVH. Echo 10/14/21: EF of 45-50% with moderate LV dysfunction, GIII restrictive. Repeat echo scheduled on 11/26/21 by Dr. Alvenia Aus as his review of the echo did not match the above interpretation.   Admitted 01/29/23 with shortness of breath for several weeks along with severe orthopnea, abdominal distention and pedal edema. Had recently been started on metolazone  and started having cramping. CXR: Cardiomegaly. Central pulmonary vessels are prominent suggesting CHF. Increased interstitial markings are seen in parahilar regions and lower lung fields suggesting interstitial pulmonary edema. Possible small left pleural effusion. Echo 01/30/23: EF 60-65% with mild LVH, G1DD, normal RV. Initially IV diuresed but then stopped due to AKI.   She presents today for a HF follow-up visit (although hasn't been seen since 04/24) with a chief complaint of shortness of breath. Has associated fatigue, abdominal distention, cough, pedal, dizziness, wheezing and 4 pillow orthopnea along with this. Denies chest pain, palpitations. Says that she's been out of all of her medications for "awhile". Has oxygen  at 1.5L that she wears at bedtime and PRN during the day.   Continues to smoke 1 cigarette daily in the morning after she drinks her coffee.    ROS: All systems negative except what is listed in HPI, PMH and Problem List  Past Medical History:  Diagnosis Date   Acute blood loss anemia 10/11/2019   Acute GI bleeding 10/11/2019   Anginal pain (HCC)     Arthritis    Asthma    CHF (congestive heart failure) (HCC)    Diabetes mellitus without complication (HCC)    Dyspnea    Hypertension    Seizures (HCC)    Sleep apnea    Past Surgical History:  Procedure Laterality Date   CARPAL TUNNEL RELEASE     COLONOSCOPY WITH PROPOFOL  N/A 01/17/2020   Procedure: COLONOSCOPY WITH PROPOFOL ;  Surgeon: Selena Daily, MD;  Location: [facility] ENDOSCOPY;  Service: Gastroenterology;  Laterality: N/A;   ESOPHAGOGASTRODUODENOSCOPY N/A 10/13/2019   Procedure: ESOPHAGOGASTRODUODENOSCOPY (EGD);  Surgeon: Irby Mannan, MD;  Location: [facility] ENDOSCOPY;  Service: Endoscopy;  Laterality: N/A;   ESOPHAGOGASTRODUODENOSCOPY N/A 09/18/2020   Procedure: ESOPHAGOGASTRODUODENOSCOPY (EGD);  Surgeon: Toledo, Alphonsus Jeans, MD;  Location: [facility] ENDOSCOPY;  Service: Gastroenterology;  Laterality: N/A;   ESOPHAGOGASTRODUODENOSCOPY (EGD) WITH PROPOFOL  N/A 10/11/2019   Procedure: ESOPHAGOGASTRODUODENOSCOPY (EGD) WITH PROPOFOL ;  Surgeon: Irby Mannan, MD;  Location: [facility] ENDOSCOPY;  Service: Endoscopy;  Laterality: N/A;   ESOPHAGOGASTRODUODENOSCOPY (EGD) WITH PROPOFOL  N/A 01/17/2020   Procedure: ESOPHAGOGASTRODUODENOSCOPY (EGD) WITH PROPOFOL ;  Surgeon: Selena Daily, MD;  Location: [facility] ENDOSCOPY;  Service: Gastroenterology;  Laterality: N/A;   TONSILLECTOMY     Family History  Problem Relation Age of Onset   Heart Problems Mother    Seizures Father    Social History   Tobacco Use   Smoking status: Former    Types: Cigarettes   Smokeless tobacco: Never  Substance Use Topics   Alcohol  use: Not Currently   Allergies  Allergen Reactions   Jardiance  [Empagliflozin ] Swelling   Pravastatin  Swelling   Aspirin Nausea Only  and Other (See Comments)    GI upset   Penicillins Rash    Prior to Admission medications   Medication Sig Start Date End Date Taking? Authorizing Provider  albuterol  (VENTOLIN  HFA) 108 (90 Base) MCG/ACT inhaler Inhale 2 puffs into the  lungs every 6 (six) hours as needed for wheezing or shortness of breath. 10/14/19  Yes Garnet Just, MD  amLODipine  (NORVASC ) 5 MG tablet TAKE ONE TABLET BY MOUTH DAILY 09/23/22  Yes Wenona Hamilton, MD  cyclobenzaprine (FLEXERIL) 10 MG tablet Take 10 mg by mouth as needed for muscle spasms.   Yes [provider]  Fluticasone Propionate, Inhal, (FLOVENT IN) Inhale into the lungs as needed.   Yes [provider]  furosemide  (LASIX ) 40 MG tablet TAKE ONE TABLET BY MOUTH DAILY 11/12/22  Yes Furth, Cadence H, PA-C  glipiZIDE (GLUCOTROL XL) 10 MG 24 hr tablet Take 10 mg by mouth 2 (two) times daily. 10/27/21  Yes [provider]  JARDIANCE  10 MG TABS tablet TAKE ONE TABLET BY MOUTH EVERY DAY BEFORE BREAKFAST 12/24/22  Yes Shawnee Dellen A, FNP  metoprolol  succinate (TOPROL -XL) 25 MG 24 hr tablet Take 1 tablet (25 mg total) by mouth daily. 11/24/22  Yes Furth, Cadence H, PA-C  pantoprazole  (PROTONIX ) 40 MG tablet Take 1 tablet (40 mg total) by mouth daily. 08/31/22  Yes Furth, Cadence H, PA-C  pravastatin  (PRAVACHOL ) 40 MG tablet TAKE ONE TABLET BY MOUTH EVERY DAY 11/07/22  Yes Shawnee Dellen A, FNP  vitamin B-12 (CYANOCOBALAMIN ) 1000 MCG tablet Take 1 tablet (1,000 mcg total) by mouth daily. 08/05/21  Yes Vanga, Elson Halon, MD  meclizine  (ANTIVERT ) 12.5 MG tablet Take 1 tablet (12.5 mg total) by mouth 3 (three) times daily as needed for up to 30 doses for dizziness. Patient not taking: Reported on 01/12/2023 08/31/22   Furth, Cadence H, PA-C  metoprolol  tartrate (LOPRESSOR ) 100 MG tablet Take 1 tablet (100 mg) by mouth 2 hours prior to your Cardiac CT 07/23/22   Furth, Cadence H, PA-C  witch hazel-glycerin  (TUCKS) pad Apply topically as needed for itching. Patient not taking: Reported on 01/12/2023 10/17/21   Tiajuana Fluke, MD    There were no vitals filed for this visit.  Wt Readings from Last 3 Encounters:  01/27/24 200 lb 12.8 oz (91.1 kg)  02/03/23 218 lb 0.6 oz (98.9 kg)   01/12/23 211 lb 4 oz (95.8 kg)   Lab Results  Component Value Date   CREATININE 1.23 (H) 01/27/2024   CREATININE 1.20 (H) 02/03/2023   CREATININE 1.17 (H) 02/02/2023    Physical Exam:   General: Well appearing. No resp difficulty HEENT: normal Neck: supple, JVD elevated to jawline Cor: Regular rhythm, rate. No rubs, gallops or murmurs Lungs: clear Abdomen: soft, nontender, distended. Extremities: no cyanosis, clubbing, rash, 2+ pitting edema bilateral lower legs Neuro: alert & oriented X 3. Moves all 4 extremities w/o difficulty. Affect pleasant    Assessment and Plan:  Acute on Chronic heart failure with preserved ejection fraction- - suspect due to HTN - NYHA III today - fluid overloaded today with worsening SOB/ abdominal distention / orthopnea - weight down 11 pounds from last visit here 1 year ago - Echo 11/26/21: EF of 60-65% along with mild LVH. Echo 10/14/21: EF of 45-50% with moderate LV dysfunction, GIII restrictive. Repeat echo scheduled on 11/26/21 by Dr. Alvenia Aus as his review of the echo did not match the above interpretation.  - Echo 01/30/23: EF 60-65% with mild LVH, G1DD,  normal RV. - will send for 80mg  IV lasix / 40meq PO potassium - BMP/ BNP today - tomorrow begin 40mg  torsemide  daily & potassium 20meq potassium daily - continue metoprolol  succinate 25mg  daily - using regular salt as well as eating salty snacks/ foods; emphasized not doing this anymore - saw cardiology Gennaro Khat) on 11/23; cardiology referral placed for her to get reestablished with them - BNP 10/13/21 was 8.4  2. HTN - - BP 148/74 - [facility]  - BMP 02/03/23 reviewed: sodium 136, potassium 4.2, creatinine 1.2 and GFR 51 - BMET today  3. Tobacco abuse - - smoking 1 cigarette day - encouraged complete cessation  4: DM- - A1c 01/29/23 was 7.6%  5: COPD/ asthma- - wearing oxygen  at 1.5L at bedtime and PRN during the day - needs to follow-up with PCP   Return in 2 weeks,  sooner if needed. Comes with transportation so can't come frequently.   Charlette Console, FNP 02/09/24

## 2024-02-10 ENCOUNTER — Telehealth: Payer: Self-pay | Admitting: Family

## 2024-02-10 ENCOUNTER — Telehealth: Payer: Self-pay

## 2024-02-10 ENCOUNTER — Other Ambulatory Visit (HOSPITAL_COMMUNITY): Payer: Self-pay

## 2024-02-10 ENCOUNTER — Encounter: Admitting: Family

## 2024-02-10 NOTE — Telephone Encounter (Signed)
 Patient did not show for her Heart Failure Clinic appointment on 02/10/24.

## 2024-02-10 NOTE — Telephone Encounter (Signed)
 Advanced Heart Failure Patient Advocate Encounter  Test billing for this patients current coverage show that Entresto, Farxiga, Jardiance  all have a $0 copay for 90 day supply. No medication assistance needed at this time.  Kennis Peacock, CPhT Rx Patient Advocate Phone: [phone]

## 2024-05-01 ENCOUNTER — Telehealth: Payer: Self-pay | Admitting: Family

## 2024-05-04 ENCOUNTER — Other Ambulatory Visit (HOSPITAL_COMMUNITY): Payer: Self-pay | Admitting: Cardiology

## 2024-05-04 MED ORDER — TORSEMIDE 20 MG PO TABS: 40.0000 mg | ORAL_TABLET | Freq: Every day | ORAL | 11 refills | Status: DC

## 2024-05-22 ENCOUNTER — Telehealth (HOSPITAL_COMMUNITY): Payer: Self-pay | Admitting: Cardiology

## 2024-05-22 NOTE — Telephone Encounter (Signed)
 Patient called with Jaime Fitzgerald Reports she is now lives in [place], will not be seen at [facility] when she is ready to call to schedule follow up with [place] HF clinic, does not wish to schedule at this time

## 2024-06-28 NOTE — Progress Notes (Incomplete)
 Advanced [facility] Failure Clinic Note    PCP: [facility] (last seen 03/24) Primary cardiologist: Deatrice Cage, MD (last seen 11/23)  Chief Complaint: shortness of breath  HPI  Jaime Fitzgerald is a 63 year old female with a past medical history of hypertension, acute blood loss anemia with history of GI bleed, asthma, diabetes mellitus type 2, seizures, sleep apnea, hyperlipidemia, COPD dyspnea, obesity.  Echo 11/26/21: EF of 60-65% along with mild LVH. Echo 10/14/21: EF of 45-50% with moderate LV dysfunction, GIII restrictive. Repeat echo scheduled on 11/26/21 by Dr. Cage as his review of the echo did not match the above interpretation.   Admitted 01/29/23 with shortness of breath for several weeks along with severe orthopnea, abdominal distention and pedal edema. Had recently been started on metolazone  and started having cramping. CXR: Cardiomegaly. Central pulmonary vessels are prominent suggesting CHF. Increased interstitial markings are seen in parahilar regions and lower lung fields suggesting interstitial pulmonary edema. Possible small left pleural effusion. Echo 01/30/23: EF 60-65% with mild LVH, G1DD, normal RV. Initially IV diuresed but then stopped due to AKI.   She presents today for a HF follow-up visit (although hasn't been seen since 04/24) with a chief complaint of shortness of breath. Has associated fatigue, abdominal distention, cough, pedal, dizziness, wheezing and 4 pillow orthopnea along with this. Denies chest pain, palpitations. Says that she's been out of all of her medications for awhile. Has oxygen  at 1.5L that she wears at bedtime and PRN during the day.   Continues to smoke 1 cigarette daily in the morning after she drinks her coffee.    ROS: All systems negative except what is listed in HPI, PMH and Problem List  Past Medical History:  Diagnosis Date   Acute blood loss anemia 10/11/2019   Acute GI bleeding 10/11/2019   Anginal pain     Arthritis    Asthma    CHF (congestive heart failure) (HCC)    Diabetes mellitus without complication (HCC)    Dyspnea    Hypertension    Seizures (HCC)    Sleep apnea    Past Surgical History:  Procedure Laterality Date   CARPAL TUNNEL RELEASE     COLONOSCOPY WITH PROPOFOL  N/A 01/17/2020   Procedure: COLONOSCOPY WITH PROPOFOL ;  Surgeon: Unk Corinn Skiff, MD;  Location: [facility] ENDOSCOPY;  Service: Gastroenterology;  Laterality: N/A;   ESOPHAGOGASTRODUODENOSCOPY N/A 10/13/2019   Procedure: ESOPHAGOGASTRODUODENOSCOPY (EGD);  Surgeon: Janalyn Keene NOVAK, MD;  Location: [facility] ENDOSCOPY;  Service: Endoscopy;  Laterality: N/A;   ESOPHAGOGASTRODUODENOSCOPY N/A 09/18/2020   Procedure: ESOPHAGOGASTRODUODENOSCOPY (EGD);  Surgeon: Toledo, Ladell POUR, MD;  Location: [facility] ENDOSCOPY;  Service: Gastroenterology;  Laterality: N/A;   ESOPHAGOGASTRODUODENOSCOPY (EGD) WITH PROPOFOL  N/A 10/11/2019   Procedure: ESOPHAGOGASTRODUODENOSCOPY (EGD) WITH PROPOFOL ;  Surgeon: Janalyn Keene NOVAK, MD;  Location: [facility] ENDOSCOPY;  Service: Endoscopy;  Laterality: N/A;   ESOPHAGOGASTRODUODENOSCOPY (EGD) WITH PROPOFOL  N/A 01/17/2020   Procedure: ESOPHAGOGASTRODUODENOSCOPY (EGD) WITH PROPOFOL ;  Surgeon: Unk Corinn Skiff, MD;  Location: [facility] ENDOSCOPY;  Service: Gastroenterology;  Laterality: N/A;   TONSILLECTOMY     Family History  Problem Relation Age of Onset   Heart Problems Mother    Seizures Father    Social History   Tobacco Use   Smoking status: Former    Types: Cigarettes   Smokeless tobacco: Never  Substance Use Topics   Alcohol  use: Not Currently   Allergies  Allergen Reactions   Jardiance  [Empagliflozin ] Swelling   Pravastatin  Swelling   Aspirin Nausea Only and  Other (See Comments)    GI upset   Penicillins Rash    Prior to Admission medications   Medication Sig Start Date End Date Taking? Authorizing Provider  albuterol  (VENTOLIN  HFA) 108 (90 Base) MCG/ACT inhaler Inhale 2 puffs into the  lungs every 6 (six) hours as needed for wheezing or shortness of breath. 10/14/19  Yes Dickie Begun, MD  amLODipine  (NORVASC ) 5 MG tablet TAKE ONE TABLET BY MOUTH DAILY 09/23/22  Yes Darron Deatrice LABOR, MD  cyclobenzaprine (FLEXERIL) 10 MG tablet Take 10 mg by mouth as needed for muscle spasms.   Yes [provider]  Fluticasone Propionate, Inhal, (FLOVENT IN) Inhale into the lungs as needed.   Yes [provider]  furosemide  (LASIX ) 40 MG tablet TAKE ONE TABLET BY MOUTH DAILY 11/12/22  Yes Furth, Cadence H, PA-C  glipiZIDE (GLUCOTROL XL) 10 MG 24 hr tablet Take 10 mg by mouth 2 (two) times daily. 10/27/21  Yes [provider]  JARDIANCE  10 MG TABS tablet TAKE ONE TABLET BY MOUTH EVERY DAY BEFORE BREAKFAST 12/24/22  Yes [place]ette [place] A, FNP  metoprolol  succinate (TOPROL -XL) 25 MG 24 hr tablet Take 1 tablet (25 mg total) by mouth daily. 11/24/22  Yes Furth, Cadence H, PA-C  pantoprazole  (PROTONIX ) 40 MG tablet Take 1 tablet (40 mg total) by mouth daily. 08/31/22  Yes Furth, Cadence H, PA-C  pravastatin  (PRAVACHOL ) 40 MG tablet TAKE ONE TABLET BY MOUTH EVERY DAY 11/07/22  Yes [place]ette [place] A, FNP  vitamin B-12 (CYANOCOBALAMIN ) 1000 MCG tablet Take 1 tablet (1,000 mcg total) by mouth daily. 08/05/21  Yes Vanga, Corinn Skiff, MD  meclizine  (ANTIVERT ) 12.5 MG tablet Take 1 tablet (12.5 mg total) by mouth 3 (three) times daily as needed for up to 30 doses for dizziness. Patient not taking: Reported on 01/12/2023 08/31/22   Furth, Cadence H, PA-C  metoprolol  tartrate (LOPRESSOR ) 100 MG tablet Take 1 tablet (100 mg) by mouth 2 hours prior to your Cardiac CT 07/23/22   Furth, Cadence H, PA-C  witch hazel-glycerin  (TUCKS) pad Apply topically as needed for itching. Patient not taking: Reported on 01/12/2023 10/17/21   Jhonny Calvin NOVAK, MD    There were no vitals filed for this visit.  Wt Readings from Last 3 Encounters:  01/27/24 91.1 kg (200 lb 12.8 oz)  02/03/23 98.9 kg (218 lb 0.6 oz)   01/12/23 95.8 kg (211 lb 4 oz)   Lab Results  Component Value Date   CREATININE 1.23 (H) 01/27/2024   CREATININE 1.20 (H) 02/03/2023   CREATININE 1.17 (H) 02/02/2023    Physical Exam:   General: Well appearing. No resp difficulty HEENT: normal Neck: supple, JVD elevated to jawline Cor: Regular rhythm, rate. No rubs, gallops or murmurs Lungs: clear Abdomen: soft, nontender, distended. Extremities: no cyanosis, clubbing, rash, 2+ pitting edema bilateral lower legs Neuro: alert & oriented X 3. Moves all 4 extremities w/o difficulty. Affect pleasant    Assessment and Plan:  Acute on Chronic heart failure with preserved ejection fraction- - suspect due to HTN - NYHA III today - fluid overloaded today with worsening SOB/ abdominal distention / orthopnea - weight down 11 pounds from last visit here 1 year ago - Echo 11/26/21: EF of 60-65% along with mild LVH. Echo 10/14/21: EF of 45-50% with moderate LV dysfunction, GIII restrictive. Repeat echo scheduled on 11/26/21 by Dr. Darron as his review of the echo did not match the above interpretation.  - Echo 01/30/23: EF 60-65% with mild LVH, G1DD, normal  RV. - will send for 80mg  IV lasix / 40meq PO potassium - BMP/ BNP today - tomorrow begin 40mg  torsemide  daily & potassium 20meq potassium daily - continue metoprolol  succinate 25mg  daily - using regular salt as well as eating salty snacks/ foods; emphasized not doing this anymore - saw cardiology Dene) on 11/23; cardiology referral placed for her to get reestablished with them - BNP 10/13/21 was 8.4  2. HTN - - BP 148/74 - [facility]  - BMP 02/03/23 reviewed: sodium 136, potassium 4.2, creatinine 1.2 and GFR 51 - BMET today  3. Tobacco abuse - - smoking 1 cigarette day - encouraged complete cessation  4: DM- - A1c 01/29/23 was 7.6%  5: COPD/ asthma- - wearing oxygen  at 1.5L at bedtime and PRN during the day - needs to follow-up with PCP   Return in 2 weeks,  sooner if needed. Comes with transportation so can't come frequently.   Harlene HERO [place], FNP 06/28/24

## 2024-06-29 ENCOUNTER — Telehealth (HOSPITAL_COMMUNITY): Payer: Self-pay

## 2024-06-29 NOTE — Telephone Encounter (Signed)
 Called to confirm/remind patient of their appointment at the [facility] on 06/30/24.   Appointment:   [] Confirmed  [] Left mess   [] No answer/No voice mail  [] VM Full/unable to leave message  [x] Phone not in service

## 2024-06-30 ENCOUNTER — Encounter (HOSPITAL_COMMUNITY): Payer: Self-pay

## 2024-06-30 ENCOUNTER — Ambulatory Visit (HOSPITAL_COMMUNITY)
Admission: RE | Admit: 2024-06-30 | Discharge: 2024-06-30 | Disposition: A | Discharge status: Home / Self Care | Source: Ambulatory Visit | Attending: Family Medicine | Admitting: Family Medicine

## 2024-06-30 VITALS — BP 120/60 | HR 86 | Ht 63.0 in | Wt 215.0 lb

## 2024-06-30 DIAGNOSIS — E1122 Type 2 diabetes mellitus with diabetic chronic kidney disease: Secondary | ICD-10-CM | POA: Diagnosis not present

## 2024-06-30 DIAGNOSIS — Z6838 Body mass index (BMI) 38.0-38.9, adult: Secondary | ICD-10-CM | POA: Insufficient documentation

## 2024-06-30 DIAGNOSIS — R569 Unspecified convulsions: Secondary | ICD-10-CM | POA: Diagnosis not present

## 2024-06-30 DIAGNOSIS — E8770 Fluid overload, unspecified: Secondary | ICD-10-CM | POA: Insufficient documentation

## 2024-06-30 DIAGNOSIS — F17210 Nicotine dependence, cigarettes, uncomplicated: Secondary | ICD-10-CM | POA: Insufficient documentation

## 2024-06-30 DIAGNOSIS — I130 Hypertensive heart and chronic kidney disease with heart failure and stage 1 through stage 4 chronic kidney disease, or unspecified chronic kidney disease: Secondary | ICD-10-CM | POA: Diagnosis not present

## 2024-06-30 DIAGNOSIS — I10 Essential (primary) hypertension: Secondary | ICD-10-CM | POA: Diagnosis not present

## 2024-06-30 DIAGNOSIS — Z5941 Food insecurity: Secondary | ICD-10-CM | POA: Insufficient documentation

## 2024-06-30 DIAGNOSIS — Z9981 Dependence on supplemental oxygen: Secondary | ICD-10-CM | POA: Insufficient documentation

## 2024-06-30 DIAGNOSIS — Z79899 Other long term (current) drug therapy: Secondary | ICD-10-CM | POA: Insufficient documentation

## 2024-06-30 DIAGNOSIS — E669 Obesity, unspecified: Secondary | ICD-10-CM | POA: Insufficient documentation

## 2024-06-30 DIAGNOSIS — Z720 Tobacco use: Secondary | ICD-10-CM

## 2024-06-30 DIAGNOSIS — Z716 Tobacco abuse counseling: Secondary | ICD-10-CM | POA: Insufficient documentation

## 2024-06-30 DIAGNOSIS — J449 Chronic obstructive pulmonary disease, unspecified: Secondary | ICD-10-CM

## 2024-06-30 DIAGNOSIS — Z7984 Long term (current) use of oral hypoglycemic drugs: Secondary | ICD-10-CM | POA: Diagnosis not present

## 2024-06-30 DIAGNOSIS — E119 Type 2 diabetes mellitus without complications: Secondary | ICD-10-CM

## 2024-06-30 DIAGNOSIS — E785 Hyperlipidemia, unspecified: Secondary | ICD-10-CM | POA: Diagnosis not present

## 2024-06-30 DIAGNOSIS — J4489 Other specified chronic obstructive pulmonary disease: Secondary | ICD-10-CM | POA: Diagnosis not present

## 2024-06-30 DIAGNOSIS — N183 Chronic kidney disease, stage 3 unspecified: Secondary | ICD-10-CM | POA: Insufficient documentation

## 2024-06-30 DIAGNOSIS — I5032 Chronic diastolic (congestive) heart failure: Secondary | ICD-10-CM | POA: Insufficient documentation

## 2024-06-30 DIAGNOSIS — G4733 Obstructive sleep apnea (adult) (pediatric): Secondary | ICD-10-CM | POA: Diagnosis not present

## 2024-06-30 DIAGNOSIS — D500 Iron deficiency anemia secondary to blood loss (chronic): Secondary | ICD-10-CM | POA: Insufficient documentation

## 2024-06-30 DIAGNOSIS — Z139 Encounter for screening, unspecified: Secondary | ICD-10-CM

## 2024-06-30 LAB — COMPREHENSIVE METABOLIC PANEL WITH GFR
ALT: 13 U/L (ref 0–44)
AST: 21 U/L (ref 15–41)
Albumin: 3.5 g/dL (ref 3.5–5.0)
Alkaline Phosphatase: 62 U/L (ref 38–126)
Anion gap: 8 (ref 5–15)
BUN: 14 mg/dL (ref 8–23)
CO2: 29 mmol/L (ref 22–32)
Calcium: 9.1 mg/dL (ref 8.9–10.3)
Chloride: 101 mmol/L (ref 98–111)
Creatinine, Ser: 1.1 mg/dL — ABNORMAL HIGH (ref 0.44–1.00)
GFR, Estimated: 56 mL/min — ABNORMAL LOW (ref >60)
Glucose, Bld: 106 mg/dL — ABNORMAL HIGH (ref 70–99)
Potassium: 4.1 mmol/L (ref 3.5–5.1)
Sodium: 138 mmol/L (ref 135–145)
Total Bilirubin: 0.4 mg/dL (ref 0.0–1.2)
Total Protein: 7.3 g/dL (ref 6.5–8.1)

## 2024-06-30 LAB — CBC
HCT: 44.7 % (ref 36.0–46.0)
Hemoglobin: 14.6 g/dL (ref 12.0–15.0)
MCH: 33.6 pg (ref 26.0–34.0)
MCHC: 32.7 g/dL (ref 30.0–36.0)
MCV: 102.8 fL — ABNORMAL HIGH (ref 80.0–100.0)
Platelets: 226 K/uL (ref 150–400)
RBC: 4.35 MIL/uL (ref 3.87–5.11)
RDW: 13.2 % (ref 11.5–15.5)
WBC: 7.3 K/uL (ref 4.0–10.5)
nRBC: 0 % (ref 0.0–0.2)

## 2024-06-30 LAB — BRAIN NATRIURETIC PEPTIDE: B Natriuretic Peptide: 11.3 pg/mL (ref 0.0–100.0)

## 2024-06-30 MED ORDER — TORSEMIDE 20 MG PO TABS: 60.0000 mg | ORAL_TABLET | Freq: Every day | ORAL | 11 refills | Status: DC

## 2024-06-30 MED ORDER — SPIRONOLACTONE 25 MG PO TABS: 12.5000 mg | ORAL_TABLET | Freq: Every day | ORAL | 3 refills | Status: AC

## 2024-06-30 NOTE — Addendum Note (Signed)
 Encounter addended by: Glena Harlene HERO, FNP on: 06/30/2024 4:42 PM  Actions taken: Vitals modified, Clinical Note Signed

## 2024-06-30 NOTE — Progress Notes (Signed)
 Heart and Vascular Care Navigation  06/30/2024  Jaime Fitzgerald Jan 01, 1961 [id number]  Reason for Referral: SDOH concerns   Engaged with patient face to face for initial visit for Heart and Vascular Care Coordination.                                                                                                   Assessment:     CSW met with pt and pt caregiver to discuss current concerns and assist as able.  Pt current source of income is SSI and gets a U-card through insurance to help with food and utility cost.  Receives $15/month in food stamp benefits.    Pt currently living with her sister but is working on getting her own apartment where she would move with her caregiver.  States she has an appt next week and would potentially be able to move in next month. States rent would be around $1000 which is over her monthly benefit- caregiver states he would help out with rent as well.  Pt reports trouble with food insecurity.  States she does not have sufficient funds for it and has no way to go to the store.  Caregiver will walk to the store but also does not have transportation.  CSW provided with list of food pantries and highlighted one that offers home delivery.  Also referred to senior resource center as another client states they receive food boxes through them.  Pt reports trouble with getting portable O2 and with needing new walker.  Just established with new PCP- [facility]- encouraged them call to get assistance with DME concerns.                                   HRT/VAS Care Coordination     Patients Home Cardiology Office Heart Failure Clinic   Outpatient Care Team Social Worker   Social Worker Name: Andriette Leech, [facility], [phone]   Living arrangements for the past 2 months Single Family Home   Lives with: Siblings   Patient Current Insurance Coverage Managed Medicare; Medicaid   Patient Has Concern With Paying Medical Bills No   Does  Patient Have Prescription Coverage? Yes   Home Assistive Devices/Equipment Oxygen ; Art gallery manager   Current home services DME       Social History:                                                                             SDOH Screenings   Food Insecurity: Food Insecurity Present (06/30/2024)  Housing: Low Risk  (01/29/2023)  Transportation Needs: Unmet Transportation Needs (06/30/2024)  Utilities: Not At Risk (01/29/2023)  Depression (PHQ2-9): Low Risk  (07/07/2022)  Financial Resource Strain: High Risk (  06/30/2024)  Tobacco Use: Medium Risk (06/30/2024)    SDOH Interventions: Financial Resources:    SSI  Food Insecurity:  Food stamps- runs out of food  Housing Insecurity:  Living temporarily with family- planning to move into appt  Transportation:   [employer] transport for appts     Follow-up plan:    CSW will continue to follow and assist as needed  Davanna He H. Janith Nielson, LCSW Clinical Social Worker [facility] Desk#: [phone] Cell#: [phone]

## 2024-06-30 NOTE — Patient Instructions (Signed)
 STOP Potassium  INCREASE Torsemide  to 60 mg ( 3 tabs) daily.  START Spironolactone  12.5 mg ( 1/2 Tab) daily.  Labs done today, your results will be available in MyChart, we will contact you for abnormal readings.  Your physician recommends that you schedule a follow-up appointment as scheduled.  If you have any questions or concerns before your next appointment please send us  a message through [place] or call our office at [phone].    TO LEAVE A MESSAGE FOR THE NURSE SELECT OPTION 2, PLEASE LEAVE A MESSAGE INCLUDING: YOUR NAME DATE OF BIRTH CALL BACK NUMBER REASON FOR CALL**this is important as we prioritize the call backs  YOU WILL RECEIVE A CALL BACK THE SAME DAY AS LONG AS YOU CALL BEFORE 4:00 PM  At the [facility], you and your health needs are our priority. As part of our continuing mission to provide you with exceptional heart care, we have created designated Provider Care Teams. These Care Teams include your primary Cardiologist (physician) and Advanced Practice Providers (APPs- Physician Assistants and Nurse Practitioners) who all work together to provide you with the care you need, when you need it.   You may see any of the following providers on your designated Care Team at your next follow up: Dr Toribio Fuel Dr Ezra Shuck Dr. Ria Commander Dr. Morene Brownie Amy Lenetta, NP Caffie Shed, [place] [facility] [place], [place] Beckey Coe, NP [place] Lee, NP Ellouise Class, NP Tinnie Redman, PharmD Jaun Bash, PharmD   Please be sure to bring in all your medications bottles to every appointment.    Thank you for choosing [facility]

## 2024-06-30 NOTE — Addendum Note (Signed)
 Encounter addended by: Cathern Andriette DEL, LCSW on: 06/30/2024 8:49 PM  Actions taken: Flowsheet accepted, Pend clinical note

## 2024-07-03 ENCOUNTER — Ambulatory Visit (HOSPITAL_COMMUNITY): Payer: Self-pay | Admitting: Family Medicine

## 2024-07-11 ENCOUNTER — Encounter (HOSPITAL_COMMUNITY): Payer: Self-pay | Admitting: Physician Assistant

## 2024-07-11 NOTE — Progress Notes (Signed)
 Called to confirm/remind patient of their appointment at the [facility] on 07/11/2024.   Appointment:   [] Confirmed  [] Left mess   [] No answer/No voice mail  [x] VM Full/unable to leave message  [] Phone not in service  Patient reminded to bring all medications and/or complete list.  Confirmed patient has transportation. Gave directions, instructed to utilize valet parking.

## 2024-07-12 ENCOUNTER — Encounter (HOSPITAL_COMMUNITY)

## 2024-07-12 NOTE — Progress Notes (Incomplete)
 Advanced Heart Failure Clinic Note    PCP: [facility], [facility]   Primary Cardiologist: Dr. Darron  HPI: Jaime Fitzgerald is a 63 y.o. female with a past medical history of HTN, acute blood loss anemia with history of GI bleed, asthma, DM2, seizures, OSA, HLD, COPD, obesity and HFpEF.  Echo 11/26/21: EF of 60-65% along with mild LVH. Echo 10/14/21: EF of 45-50% with moderate LV dysfunction, GIII restrictive. Repeat echo scheduled on 11/26/21 by Dr. Darron as his review of the echo did not match the above interpretation.   Admitted 01/29/23 with a/c HF. Echo showed EF EF 60-65% with mild LVH, G1DD, normal RV. Diuresed with IV lasix . GDMT titrated but limited due to renal function.  Seen for f/u 06/30/24. She was volume overloaded. Torsemide  increased and started on spironolactone .   Here today for close CHF follow-up.  Smokes 1 cig/day. No ETOH or drugs. Living with sister, has food and housing insecurity. Does not wear CPAP. Does not have a local PCP.  ROS: All systems negative except what is listed in HPI, PMH and Problem List  Past Medical History:  Diagnosis Date   Acute blood loss anemia 10/11/2019   Acute GI bleeding 10/11/2019   Anginal pain    Arthritis    Asthma    CHF (congestive heart failure) (HCC)    Diabetes mellitus without complication (HCC)    Dyspnea    Hypertension    Seizures (HCC)    Sleep apnea    Past Surgical History:  Procedure Laterality Date   CARPAL TUNNEL RELEASE     COLONOSCOPY WITH PROPOFOL  N/A 01/17/2020   Procedure: COLONOSCOPY WITH PROPOFOL ;  Surgeon: Unk Corinn Skiff, MD;  Location: [facility] ENDOSCOPY;  Service: Gastroenterology;  Laterality: N/A;   ESOPHAGOGASTRODUODENOSCOPY N/A 10/13/2019   Procedure: ESOPHAGOGASTRODUODENOSCOPY (EGD);  Surgeon: Janalyn Keene NOVAK, MD;  Location: [facility] ENDOSCOPY;  Service: Endoscopy;  Laterality: N/A;   ESOPHAGOGASTRODUODENOSCOPY N/A 09/18/2020   Procedure: ESOPHAGOGASTRODUODENOSCOPY (EGD);  Surgeon:  Toledo, Ladell POUR, MD;  Location: [facility] ENDOSCOPY;  Service: Gastroenterology;  Laterality: N/A;   ESOPHAGOGASTRODUODENOSCOPY (EGD) WITH PROPOFOL  N/A 10/11/2019   Procedure: ESOPHAGOGASTRODUODENOSCOPY (EGD) WITH PROPOFOL ;  Surgeon: Janalyn Keene NOVAK, MD;  Location: [facility] ENDOSCOPY;  Service: Endoscopy;  Laterality: N/A;   ESOPHAGOGASTRODUODENOSCOPY (EGD) WITH PROPOFOL  N/A 01/17/2020   Procedure: ESOPHAGOGASTRODUODENOSCOPY (EGD) WITH PROPOFOL ;  Surgeon: Unk Corinn Skiff, MD;  Location: [facility] ENDOSCOPY;  Service: Gastroenterology;  Laterality: N/A;   TONSILLECTOMY     Family History  Problem Relation Age of Onset   Heart Problems Mother    Seizures Father    Social History   Tobacco Use   Smoking status: Former    Types: Cigarettes   Smokeless tobacco: Never  Substance Use Topics   Alcohol  use: Not Currently   Allergies  Allergen Reactions   Jardiance  [Empagliflozin ] Swelling   Pravastatin  Swelling   Aspirin Nausea Only and Other (See Comments)    GI upset   Penicillins Rash   Current Outpatient Medications on File Prior to Visit  Medication Sig Dispense Refill   albuterol  (VENTOLIN  HFA) 108 (90 Base) MCG/ACT inhaler Inhale 2 puffs into the lungs every 6 (six) hours as needed for wheezing or shortness of breath.     amLODipine  (NORVASC ) 5 MG tablet Take 5 mg by mouth daily.     bisacodyl  (DULCOLAX) 5 MG EC tablet Take 2 tablets (10 mg total) by mouth daily as needed for moderate constipation. 30 tablet 0   Cholecalciferol (VITAMIN D -3) 25 MCG (  1000 UT) CAPS Take 1 capsule by mouth daily.     cyclobenzaprine (FLEXERIL) 10 MG tablet Take 10 mg by mouth 3 (three) times daily as needed for muscle spasms.     fluticasone (FLONASE) 50 MCG/ACT nasal spray Place 2 sprays into both nostrils daily as needed for allergies or rhinitis.     gabapentin  (NEURONTIN ) 300 MG capsule Take 300 mg by mouth 3 (three) times daily.     meclizine  (ANTIVERT ) 12.5 MG tablet Take 12.5 mg by mouth 2 (two)  times daily as needed for dizziness.     metoprolol  succinate (TOPROL -XL) 25 MG 24 hr tablet TAKE ONE TABLET BY MOUTH DAILY. **MUST KEEP APPOINTMENT FOR REFILLS** 90 tablet 3   nicotine  (NICODERM CQ  - DOSED IN MG/24 HOURS) 14 mg/24hr patch Place 1 patch (14 mg total) onto the skin daily. (Patient not taking: Reported on 06/30/2024) 28 patch 0   pantoprazole  (PROTONIX ) 40 MG tablet Take 1 tablet (40 mg total) by mouth daily. 30 tablet 2   polyethylene glycol (MIRALAX  / GLYCOLAX ) 17 g packet Take 17 g by mouth 2 (two) times daily. 14 each 0   polyvinyl alcohol  (LIQUIFILM TEARS) 1.4 % ophthalmic solution Place 2 drops into both eyes as needed for dry eyes. (Patient not taking: Reported on 06/30/2024) 15 mL 0   pravastatin  (PRAVACHOL ) 40 MG tablet Take 40 mg by mouth daily.     spironolactone  (ALDACTONE ) 25 MG tablet Take 0.5 tablets (12.5 mg total) by mouth daily. 45 tablet 3   torsemide  (DEMADEX ) 20 MG tablet Take 3 tablets (60 mg total) by mouth daily. 60 tablet 11   traZODone  (DESYREL ) 50 MG tablet Take 1 tablet (50 mg total) by mouth at bedtime as needed for sleep. 30 tablet 0   triamcinolone cream (KENALOG) 0.1 % Apply 1 Application topically 2 (two) times daily.     vitamin B-12 (CYANOCOBALAMIN ) 1000 MCG tablet Take 1 tablet (1,000 mcg total) by mouth daily. 90 tablet 1   No current facility-administered medications on file prior to visit.   Wt Readings from Last 3 Encounters:  06/30/24 97.5 kg (215 lb)  01/27/24 91.1 kg (200 lb 12.8 oz)  02/03/23 98.9 kg (218 lb 0.6 oz)   There were no vitals taken for this visit.  Physical Exam:  General:  Aarrived in motorized scooter, chronically ill appearing HEENT: Normal Neck: Supple. Thick neck Cor: Regular rate & rhythm. No rubs, gallops or murmurs. Lungs: Clear, faint crackles in bases Abdomen: Soft, obese, nontender, nondistended.  Extremities: No cyanosis, clubbing, rash, 1_ BLE edema Neuro: Alert & oriented x 3, moves all 4 extremities w/o  difficulty. Affect pleasant.  ECG (personally reviewed): NSR 83 bpm   Assessment and Plan: Chronic Diastolic Heart Failure - suspect due to HTN - Echo 1/23: EF of 45-50%, moderate LV dysfunction, G3DD, restrictive. - Echo 2/23: EF 60-65% along with mild LVH.   - Echo 4/24: EF 60-65% with mild LVH, G1DD, normal RV. - NYHA III-IIIb, suspect COPD and deconditioning contributing. She is mildly volume overloaded. - Increase torsemide  to 60 mg daily. - Start spironolactone  12.5 mg daily - Stop KCL - Consider losartan next visit. - Failed Jardiance  with swelling. Consider Farxiga. - Labs today. Repeat BMET at follow up - Will get updated echo after diuresis  2. HTN  - BP OK today - Adding spiro as above - Favor stopping amlodpine and starting losartan next visit. - Labs today  3. CKD 3 - Follows with nephrology at [facility] (Dr.  Manivannan) - Baseline SCr ~ 1.2 - Labs today  4. Tobacco abuse  - smoking 1 cigarette day - Discussed cessation  5. DM2 - Management per PCP - ? statin  6. COPD - Wears 1.5 L oxygen  PRN - Discussed smoking cessation - Consider switch Toprol  to bisoprolol - Needs PCP  7. SDOH - She is insured, uses Medicaid transportation - She has food insecurity. - Working on obtaining an apartment with her caregiver - Income is SSI and receives U-card for utilities and food. $15 a month in food stamps. Referred to food pantry offering delivery and senior resource center by HF CSW at last visit  Follow up  [facility], MANUELITA SAILOR, PA-C 07/12/24

## 2024-07-20 ENCOUNTER — Other Ambulatory Visit: Payer: Self-pay | Admitting: Family

## 2024-07-25 ENCOUNTER — Telehealth (HOSPITAL_COMMUNITY): Payer: Self-pay

## 2024-07-25 NOTE — Telephone Encounter (Signed)
 Called to confirm/remind patient of their appointment at the Advanced [facility] Failure Clinic on 21/22/25 3:00.   Appointment:   [] Confirmed  [] Left mess   [] No answer/No voice mail  [x] VM Full/unable to leave message  [] Phone not in service  Patient reminded to bring all medications and/or complete list.  Confirmed patient has transportation. Gave directions, instructed to utilize valet parking.

## 2024-07-26 ENCOUNTER — Encounter (HOSPITAL_COMMUNITY): Payer: Self-pay

## 2024-07-26 ENCOUNTER — Ambulatory Visit (HOSPITAL_COMMUNITY)
Admission: RE | Admit: 2024-07-26 | Discharge: 2024-07-26 | Disposition: A | Discharge status: Home / Self Care | Source: Ambulatory Visit | Attending: Internal Medicine | Admitting: Internal Medicine

## 2024-07-26 VITALS — BP 130/84 | HR 82 | Ht 63.0 in | Wt 167.0 lb

## 2024-07-26 DIAGNOSIS — Z5941 Food insecurity: Secondary | ICD-10-CM | POA: Diagnosis not present

## 2024-07-26 DIAGNOSIS — G4733 Obstructive sleep apnea (adult) (pediatric): Secondary | ICD-10-CM | POA: Diagnosis not present

## 2024-07-26 DIAGNOSIS — E119 Type 2 diabetes mellitus without complications: Secondary | ICD-10-CM

## 2024-07-26 DIAGNOSIS — I130 Hypertensive heart and chronic kidney disease with heart failure and stage 1 through stage 4 chronic kidney disease, or unspecified chronic kidney disease: Secondary | ICD-10-CM | POA: Diagnosis not present

## 2024-07-26 DIAGNOSIS — I10 Essential (primary) hypertension: Secondary | ICD-10-CM | POA: Diagnosis not present

## 2024-07-26 DIAGNOSIS — Z79899 Other long term (current) drug therapy: Secondary | ICD-10-CM | POA: Insufficient documentation

## 2024-07-26 DIAGNOSIS — I5032 Chronic diastolic (congestive) heart failure: Secondary | ICD-10-CM | POA: Diagnosis not present

## 2024-07-26 DIAGNOSIS — F17210 Nicotine dependence, cigarettes, uncomplicated: Secondary | ICD-10-CM | POA: Insufficient documentation

## 2024-07-26 DIAGNOSIS — Z720 Tobacco use: Secondary | ICD-10-CM | POA: Diagnosis not present

## 2024-07-26 DIAGNOSIS — N183 Chronic kidney disease, stage 3 unspecified: Secondary | ICD-10-CM | POA: Diagnosis not present

## 2024-07-26 DIAGNOSIS — Z139 Encounter for screening, unspecified: Secondary | ICD-10-CM

## 2024-07-26 DIAGNOSIS — J449 Chronic obstructive pulmonary disease, unspecified: Secondary | ICD-10-CM | POA: Diagnosis not present

## 2024-07-26 DIAGNOSIS — E1122 Type 2 diabetes mellitus with diabetic chronic kidney disease: Secondary | ICD-10-CM | POA: Diagnosis not present

## 2024-07-26 LAB — BASIC METABOLIC PANEL WITH GFR
Anion gap: 10 (ref 5–15)
BUN: 13 mg/dL (ref 8–23)
CO2: 28 mmol/L (ref 22–32)
Calcium: 8.7 mg/dL — ABNORMAL LOW (ref 8.9–10.3)
Chloride: 102 mmol/L (ref 98–111)
Creatinine, Ser: 1.21 mg/dL — ABNORMAL HIGH (ref 0.44–1.00)
GFR, Estimated: 50 mL/min — ABNORMAL LOW (ref >60)
Glucose, Bld: 79 mg/dL (ref 70–99)
Potassium: 3.9 mmol/L (ref 3.5–5.1)
Sodium: 140 mmol/L (ref 135–145)

## 2024-07-26 LAB — BRAIN NATRIURETIC PEPTIDE: B Natriuretic Peptide: 18.7 pg/mL (ref 0.0–100.0)

## 2024-07-26 MED ORDER — TORSEMIDE 20 MG PO TABS
40.0000 mg | ORAL_TABLET | Freq: Every day | ORAL | 11 refills | Status: AC
Start: 2024-07-26 — End: ?

## 2024-07-26 NOTE — Progress Notes (Signed)
 ReDS Vest / Clip - 07/26/24 1448       ReDS Vest / Clip   Station Marker A    Ruler Value 44.5    ReDS Value Range Low volume    ReDS Actual Value 24

## 2024-07-26 NOTE — Progress Notes (Signed)
 Advanced Heart Failure Clinic Note    PCP: Center, [facility]  / [facility] Primary Cardiologist: Dr. Darron  HPI: Jaime Fitzgerald is a 63 y.o. female with a past medical history of HTN, acute blood loss anemia with history of GI bleed, asthma, DM2, seizures, OSA, HLD, COPD, obesity and HFpEF.  Echo 11/26/21: EF of 60-65% along with mild LVH. Echo 10/14/21: EF of 45-50% with moderate LV dysfunction, GIII restrictive. Repeat echo scheduled on 11/26/21 by Dr. Darron as his review of the echo did not match the above interpretation.   Admitted 01/29/23 with a/c HF. Echo showed EF EF 60-65% with mild LVH, G1DD, normal RV. Diuresed with IV lasix . GDMT titrated but limited due to renal function.  Seen for f/u 06/30/24. She was volume overloaded. Torsemide  increased and started on spironolactone .   Today she returns for AHF follow up with her care provider. Overall feeling ok just tired. Denies palpitations, CP,  or PND/Orthopnea. Some edema primarily in her L leg. Dizziness with activity or when she feels SOB. SOB with ambulation or when trying to do things around the house for prolonged periods of time. Appetite ok. No fever or chills. Weight at home 157 pounds. Taking all medications except KDUR.   Smokes 1 cig/day. No ETOH or drugs. Living with sister, has food and housing insecurity. Does not wear CPAP. Has an appt with [facility] 10/28.   ROS: All systems negative except what is listed in HPI, PMH and Problem List  Past Medical History:  Diagnosis Date   Acute blood loss anemia 10/11/2019   Acute GI bleeding 10/11/2019   Anginal pain    Arthritis    Asthma    CHF (congestive heart failure) (HCC)    Diabetes mellitus without complication (HCC)    Dyspnea    Hypertension    Seizures (HCC)    Sleep apnea    Past Surgical History:  Procedure Laterality Date   CARPAL TUNNEL RELEASE     COLONOSCOPY WITH PROPOFOL  N/A 01/17/2020   Procedure: COLONOSCOPY WITH  PROPOFOL ;  Surgeon: Unk Corinn Skiff, MD;  Location: [facility] ENDOSCOPY;  Service: Gastroenterology;  Laterality: N/A;   ESOPHAGOGASTRODUODENOSCOPY N/A 10/13/2019   Procedure: ESOPHAGOGASTRODUODENOSCOPY (EGD);  Surgeon: Janalyn Keene NOVAK, MD;  Location[facility] ENDOSCOPY;  Service: Endoscopy;  Laterality: N/A;   ESOPHAGOGASTRODUODENOSCOPY N/A 09/18/2020   Procedure: ESOPHAGOGASTRODUODENOSCOPY (EGD);  Surgeon: Toledo, Ladell POUR, MD;  Location: [facility] ENDOSCOPY;  Service: Gastroenterology;  Laterality: N/A;   ESOPHAGOGASTRODUODENOSCOPY (EGD) WITH PROPOFOL  N/A 10/11/2019   Procedure: ESOPHAGOGASTRODUODENOSCOPY (EGD) WITH PROPOFOL ;  Surgeon: Janalyn Keene NOVAK, MD;  Location: [facility] ENDOSCOPY;  Service: Endoscopy;  Laterality: N/A;   ESOPHAGOGASTRODUODENOSCOPY (EGD) WITH PROPOFOL  N/A 01/17/2020   Procedure: ESOPHAGOGASTRODUODENOSCOPY (EGD) WITH PROPOFOL ;  Surgeon: Unk Corinn Skiff, MD;  Location: [facility] ENDOSCOPY;  Service: Gastroenterology;  Laterality: N/A;   TONSILLECTOMY     Family History  Problem Relation Age of Onset   Heart Problems Mother    Seizures Father    Social History   Tobacco Use   Smoking status: Former    Types: Cigarettes   Smokeless tobacco: Never  Substance Use Topics   Alcohol  use: Not Currently   Allergies  Allergen Reactions   Jardiance  [Empagliflozin ] Swelling   Pravastatin  Swelling   Aspirin Nausea Only and Other (See Comments)    GI upset   Penicillins Rash   Current Outpatient Medications on File Prior to Encounter  Medication Sig Dispense Refill   albuterol  (VENTOLIN  HFA) 108 (  90 Base) MCG/ACT inhaler Inhale 2 puffs into the lungs every 6 (six) hours as needed for wheezing or shortness of breath.     amLODipine  (NORVASC ) 5 MG tablet Take 5 mg by mouth daily.     bisacodyl  (DULCOLAX) 5 MG EC tablet Take 2 tablets (10 mg total) by mouth daily as needed for moderate constipation. 30 tablet 0   Cholecalciferol (VITAMIN D -3) 25 MCG (1000 UT) CAPS Take 1 capsule by  mouth daily.     cyclobenzaprine (FLEXERIL) 10 MG tablet Take 10 mg by mouth 3 (three) times daily as needed for muscle spasms.     fluticasone (FLONASE) 50 MCG/ACT nasal spray Place 2 sprays into both nostrils daily as needed for allergies or rhinitis.     gabapentin  (NEURONTIN ) 300 MG capsule Take 300 mg by mouth 3 (three) times daily.     meclizine  (ANTIVERT ) 12.5 MG tablet Take 12.5 mg by mouth 2 (two) times daily as needed for dizziness.     metoprolol  succinate (TOPROL -XL) 25 MG 24 hr tablet TAKE ONE TABLET BY MOUTH DAILY. **MUST KEEP APPOINTMENT FOR REFILLS** 90 tablet 3   pantoprazole  (PROTONIX ) 40 MG tablet Take 1 tablet (40 mg total) by mouth daily. 30 tablet 2   polyethylene glycol (MIRALAX  / GLYCOLAX ) 17 g packet Take 17 g by mouth 2 (two) times daily. 14 each 0   pravastatin  (PRAVACHOL ) 40 MG tablet Take 40 mg by mouth daily.     spironolactone  (ALDACTONE ) 25 MG tablet Take 0.5 tablets (12.5 mg total) by mouth daily. 45 tablet 3   torsemide  (DEMADEX ) 20 MG tablet Take 3 tablets (60 mg total) by mouth daily. 60 tablet 11   traZODone  (DESYREL ) 50 MG tablet Take 1 tablet (50 mg total) by mouth at bedtime as needed for sleep. 30 tablet 0   triamcinolone cream (KENALOG) 0.1 % Apply 1 Application topically 2 (two) times daily.     vitamin B-12 (CYANOCOBALAMIN ) 1000 MCG tablet Take 1 tablet (1,000 mcg total) by mouth daily. 90 tablet 1   nicotine  (NICODERM CQ  - DOSED IN MG/24 HOURS) 14 mg/24hr patch Place 1 patch (14 mg total) onto the skin daily. (Patient not taking: Reported on 07/26/2024) 28 patch 0   polyvinyl alcohol  (LIQUIFILM TEARS) 1.4 % ophthalmic solution Place 2 drops into both eyes as needed for dry eyes. (Patient not taking: Reported on 07/26/2024) 15 mL 0   No current facility-administered medications on file prior to encounter.   Wt Readings from Last 3 Encounters:  07/26/24 75.8 kg (167 lb)  06/30/24 97.5 kg (215 lb)  01/27/24 91.1 kg (200 lb 12.8 oz)   BP 130/84    Pulse 82   Ht 5' 3 (1.6 m)   Wt 75.8 kg (167 lb)   SpO2 91%   BMI 29.58 kg/m   Physical Exam:  General:  chronically ill appearing.  No respiratory difficulty Neck: JVD ~7 cm.  Cor: Regular rate & rhythm. No murmurs. Lungs: clear, diminished bases Extremities: +1 LLE edema  Neuro: alert & oriented x 3. Affect pleasant.   ECG (personally reviewed from 06/30/24): NSR 83 bpm  ReDs reading: 24 %, low-normal    Assessment and Plan: Chronic Diastolic Heart Failure - suspect due to HTN - Echo 1/23: EF of 45-50%, moderate LV dysfunction, G3DD, restrictive. - Echo 2/23: EF 60-65% along with mild LVH.   - Echo 4/24: EF 60-65% with mild LVH, G1DD, normal RV. - NYHA III-IIIb, suspect COPD and deconditioning contributing. Appears euvolemic on exam.  Weight down 48 lbs in ~1 month. ReDS 24%. Will check labs today, worried she may have AKI.  - Decrease torsemide  to 60>40 mg daily. - Continue spironolactone  12.5 mg daily. Now off KCL - Consider losartan. Pending labs today.  - Failed Jardiance  with swelling. Consider Farxiga. - Labs today.  - Will get updated echo after diuresis  2. HTN  - BP mildly elevated today.  - Continue spiro - Favor stopping amlodpine and starting losartan. Discussed this today, will arrange if labs stable.  - Labs today  3. CKD 3 - Follows with nephrology at [facility] (Dr. Teresia) - Baseline SCr ~ 1.2 - Labs today  4. Tobacco abuse  - smoking 1 cigarette day - Discussed cessation  5. DM2 - Management per PCP - ? statin  6. COPD - Wears 1.5 L oxygen  PRN - Discussed smoking cessation - Consider switch Toprol  to bisoprolol - Now established with PCP.   7. SDOH - She is insured, uses Medicaid transportation - She has food insecurity. - Working on obtaining an apartment with her caregiver - Income is SSI and receives U-card for utilities and food. $15 a month in food stamps. Referred to food pantry offering delivery and senior resource center by HF CSW  at previous visit  Follow up in 3 weeks with APP.   Beckey LITTIE Coe, NP 07/26/24

## 2024-07-26 NOTE — Patient Instructions (Signed)
 Medication Changes:  DECREASE TORSEMIDE  TO 40MG  ONCE DAILY   Lab Work:  Labs done today, your results will be available in MyChart, we will contact you for abnormal readings  Follow-Up in: 3 WEEKS AS SCHEDULED WITH APP CLINIC   At the Advanced [facility] Failure Clinic, you and your health needs are our priority. We have a designated team specialized in the treatment of Heart Failure. This Care Team includes your primary Heart Failure Specialized Cardiologist (physician), Advanced Practice Providers (APPs- Physician Assistants and Nurse Practitioners), and Pharmacist who all work together to provide you with the care you need, when you need it.   You may see any of the following providers on your designated Care Team at your next follow up:  Dr. Toribio Fuel Dr. Ezra Shuck Dr. Ria Commander Dr. Odis Brownie Greig Mosses, NP Caffie Shed, [place] [facility], [place] Beckey Coe, NP [place] Lee, NP Tinnie Redman, PharmD   Please be sure to bring in all your medications bottles to every appointment.   Need to Contact Us :  If you have any questions or concerns before your next appointment please send us  a message through [place] or call our office at [phone].    TO LEAVE A MESSAGE FOR THE NURSE SELECT OPTION 2, PLEASE LEAVE A MESSAGE INCLUDING: YOUR NAME DATE OF BIRTH CALL BACK NUMBER REASON FOR CALL**this is important as we prioritize the call backs  YOU WILL RECEIVE A CALL BACK THE SAME DAY AS LONG AS YOU CALL BEFORE 4:00 PM

## 2024-07-27 ENCOUNTER — Ambulatory Visit (HOSPITAL_COMMUNITY): Payer: Self-pay | Admitting: Internal Medicine

## 2024-07-31 ENCOUNTER — Encounter (HOSPITAL_COMMUNITY): Payer: Self-pay | Admitting: *Deleted

## 2024-08-01 ENCOUNTER — Encounter (HOSPITAL_COMMUNITY): Payer: Self-pay

## 2024-08-03 ENCOUNTER — Encounter: Admitting: Adult Health

## 2024-08-03 NOTE — Progress Notes (Signed)
 This encounter was created in error - please disregard.

## 2024-08-14 NOTE — Progress Notes (Incomplete)
 Advanced Heart Failure Clinic Note    PCP: [facility], [facility]  / [facility] Primary Cardiologist: Dr. Darron  HPI: Jaime Fitzgerald is a 63 y.o. female with a past medical history of HTN, acute blood loss anemia with history of GI bleed, asthma, DM2, seizures, OSA, HLD, COPD, obesity and HFpEF.  Echo 11/26/21: EF of 60-65% along with mild LVH. Echo 10/14/21: EF of 45-50% with moderate LV dysfunction, GIII restrictive. Repeat echo scheduled on 11/26/21 by Dr. Darron as his review of the echo did not match the above interpretation.   Admitted 01/29/23 with a/c HF. Echo showed EF EF 60-65% with mild LVH, G1DD, normal RV. Diuresed with IV lasix . GDMT titrated but limited due to renal function.  Seen for f/u 06/30/24. She was volume overloaded. Torsemide  increased and started on spironolactone .   Today she returns for AHF follow up with her care provider. Overall feeling ok just tired. Denies palpitations, CP,  or PND/Orthopnea. Some edema primarily in her L leg. Dizziness with activity or when she feels SOB. SOB with ambulation or when trying to do things around the house for prolonged periods of time. Appetite ok. No fever or chills. Weight at home 157 pounds. Taking all medications except KDUR.   Smokes 1 cig/day. No ETOH or drugs. Living with sister, has food and housing insecurity. Does not wear CPAP. Has an appt with [facility] 10/28.   ROS: All systems negative except what is listed in HPI, PMH and Problem List  Past Medical History:  Diagnosis Date   Acute blood loss anemia 10/11/2019   Acute GI bleeding 10/11/2019   Anginal pain    Arthritis    Asthma    CHF (congestive heart failure) (HCC)    Diabetes mellitus without complication (HCC)    Dyspnea    Hypertension    Seizures (HCC)    Sleep apnea    Past Surgical History:  Procedure Laterality Date   CARPAL TUNNEL RELEASE     COLONOSCOPY WITH PROPOFOL  N/A 01/17/2020   Procedure: COLONOSCOPY WITH  PROPOFOL ;  Surgeon: Unk Corinn Skiff, MD;  Location: [facility] ENDOSCOPY;  Service: Gastroenterology;  Laterality: N/A;   ESOPHAGOGASTRODUODENOSCOPY N/A 10/13/2019   Procedure: ESOPHAGOGASTRODUODENOSCOPY (EGD);  Surgeon: Janalyn Keene NOVAK, MD;  Location: [facility] ENDOSCOPY;  Service: Endoscopy;  Laterality: N/A;   ESOPHAGOGASTRODUODENOSCOPY N/A 09/18/2020   Procedure: ESOPHAGOGASTRODUODENOSCOPY (EGD);  Surgeon: Toledo, Ladell POUR, MD;  Location: [facility] ENDOSCOPY;  Service: Gastroenterology;  Laterality: N/A;   ESOPHAGOGASTRODUODENOSCOPY (EGD) WITH PROPOFOL  N/A 10/11/2019   Procedure: ESOPHAGOGASTRODUODENOSCOPY (EGD) WITH PROPOFOL ;  Surgeon: Janalyn Keene NOVAK, MD;  Location: [facility] ENDOSCOPY;  Service: Endoscopy;  Laterality: N/A;   ESOPHAGOGASTRODUODENOSCOPY (EGD) WITH PROPOFOL  N/A 01/17/2020   Procedure: ESOPHAGOGASTRODUODENOSCOPY (EGD) WITH PROPOFOL ;  Surgeon: Unk Corinn Skiff, MD;  Location: [facility] ENDOSCOPY;  Service: Gastroenterology;  Laterality: N/A;   TONSILLECTOMY     Family History  Problem Relation Age of Onset   Heart Problems Mother    Seizures Father    Social History   Tobacco Use   Smoking status: Former    Types: Cigarettes   Smokeless tobacco: Never  Substance Use Topics   Alcohol  use: Not Currently   Allergies  Allergen Reactions   Jardiance  [Empagliflozin ] Swelling   Pravastatin  Swelling   Aspirin Nausea Only and Other (See Comments)    GI upset   Penicillins Rash   Current Outpatient Medications on File Prior to Visit  Medication Sig Dispense Refill   albuterol  (VENTOLIN  HFA) 108 (  90 Base) MCG/ACT inhaler Inhale 2 puffs into the lungs every 6 (six) hours as needed for wheezing or shortness of breath.     amLODipine  (NORVASC ) 5 MG tablet Take 5 mg by mouth daily.     bisacodyl  (DULCOLAX) 5 MG EC tablet Take 2 tablets (10 mg total) by mouth daily as needed for moderate constipation. 30 tablet 0   Cholecalciferol (VITAMIN D -3) 25 MCG (1000 UT) CAPS Take 1 capsule by  mouth daily.     cyclobenzaprine (FLEXERIL) 10 MG tablet Take 10 mg by mouth 3 (three) times daily as needed for muscle spasms.     fluticasone (FLONASE) 50 MCG/ACT nasal spray Place 2 sprays into both nostrils daily as needed for allergies or rhinitis.     gabapentin  (NEURONTIN ) 300 MG capsule Take 300 mg by mouth 3 (three) times daily.     meclizine  (ANTIVERT ) 12.5 MG tablet Take 12.5 mg by mouth 2 (two) times daily as needed for dizziness.     metoprolol  succinate (TOPROL -XL) 25 MG 24 hr tablet TAKE ONE TABLET BY MOUTH DAILY. **MUST KEEP APPOINTMENT FOR REFILLS** 90 tablet 3   nicotine  (NICODERM CQ  - DOSED IN MG/24 HOURS) 14 mg/24hr patch Place 1 patch (14 mg total) onto the skin daily. (Patient not taking: Reported on 07/26/2024) 28 patch 0   pantoprazole  (PROTONIX ) 40 MG tablet Take 1 tablet (40 mg total) by mouth daily. 30 tablet 2   polyethylene glycol (MIRALAX  / GLYCOLAX ) 17 g packet Take 17 g by mouth 2 (two) times daily. 14 each 0   polyvinyl alcohol  (LIQUIFILM TEARS) 1.4 % ophthalmic solution Place 2 drops into both eyes as needed for dry eyes. (Patient not taking: Reported on 07/26/2024) 15 mL 0   pravastatin  (PRAVACHOL ) 40 MG tablet Take 40 mg by mouth daily.     spironolactone  (ALDACTONE ) 25 MG tablet Take 0.5 tablets (12.5 mg total) by mouth daily. 45 tablet 3   torsemide  (DEMADEX ) 20 MG tablet Take 2 tablets (40 mg total) by mouth daily. 60 tablet 11   traZODone  (DESYREL ) 50 MG tablet Take 1 tablet (50 mg total) by mouth at bedtime as needed for sleep. 30 tablet 0   triamcinolone cream (KENALOG) 0.1 % Apply 1 Application topically 2 (two) times daily.     vitamin B-12 (CYANOCOBALAMIN ) 1000 MCG tablet Take 1 tablet (1,000 mcg total) by mouth daily. 90 tablet 1   No current facility-administered medications on file prior to visit.   Wt Readings from Last 3 Encounters:  07/26/24 75.8 kg (167 lb)  06/30/24 97.5 kg (215 lb)  01/27/24 91.1 kg (200 lb 12.8 oz)   There were no vitals  taken for this visit.  Physical Exam:  General:  chronically ill appearing.  No respiratory difficulty Neck: JVD ~7 cm.  Cor: Regular rate & rhythm. No murmurs. Lungs: clear, diminished bases Extremities: +1 LLE edema  Neuro: alert & oriented x 3. Affect pleasant.   ECG (personally reviewed from 06/30/24): NSR 83 bpm  ReDs reading: 24 %, low-normal    Assessment and Plan: Chronic Diastolic Heart Failure - suspect due to HTN - Echo 1/23: EF of 45-50%, moderate LV dysfunction, G3DD, restrictive. - Echo 2/23: EF 60-65% along with mild LVH.   - Echo 4/24: EF 60-65% with mild LVH, G1DD, normal RV. - NYHA III-IIIb, suspect COPD and deconditioning contributing. Appears euvolemic on exam. Weight down 48 lbs in ~1 month. ReDS 24%. Will check labs today, worried she may have AKI.  - Decrease torsemide   to 60>40 mg daily. - Continue spironolactone  12.5 mg daily. Now off KCL - Consider losartan. Pending labs today.  - Failed Jardiance  with swelling. Consider Farxiga. - Labs today.  - Will get updated echo after diuresis  2. HTN  - BP mildly elevated today.  - Continue spiro - Favor stopping amlodpine and starting losartan. Discussed this today, will arrange if labs stable.  - Labs today  3. CKD 3 - Follows with nephrology at [facility] (Dr. Teresia) - Baseline SCr ~ 1.2 - Labs today  4. Tobacco abuse  - smoking 1 cigarette day - Discussed cessation  5. DM2 - Management per PCP - ? statin  6. COPD - Wears 1.5 L oxygen  PRN - Discussed smoking cessation - Consider switch Toprol  to bisoprolol - Now established with PCP.   7. SDOH - She is insured, uses Medicaid transportation - She has food insecurity. - Working on obtaining an apartment with her caregiver - Income is SSI and receives U-card for utilities and food. $15 a month in food stamps. Referred to food pantry offering delivery and senior resource center by HF CSW at previous visit  Follow up in 3 weeks with APP.    Harlene CHRISTELLA Gainer, FNP 08/14/24

## 2024-08-15 ENCOUNTER — Telehealth: Payer: Self-pay

## 2024-08-15 NOTE — Telephone Encounter (Signed)
 Called to confirm/remind patient of their appointment at the [facility] on 08/16/24.   Appointment:   [] Confirmed  [] Left mess   [] No answer/No voice mail  [] VM Full/unable to leave message  [x] Phone not in service

## 2024-08-16 ENCOUNTER — Ambulatory Visit (HOSPITAL_COMMUNITY)
